# Patient Record
Sex: Female | Born: 1950
Health system: Southern US, Community
[De-identification: ages and names within clinical notes are randomized; demographics above are authoritative.]

## PROBLEM LIST (undated history)

## (undated) DIAGNOSIS — E785 Hyperlipidemia, unspecified: Secondary | ICD-10-CM

## (undated) DIAGNOSIS — M1990 Unspecified osteoarthritis, unspecified site: Secondary | ICD-10-CM

## (undated) DIAGNOSIS — G459 Transient cerebral ischemic attack, unspecified: Secondary | ICD-10-CM

## (undated) DIAGNOSIS — K2970 Gastritis, unspecified, without bleeding: Secondary | ICD-10-CM

## (undated) DIAGNOSIS — I10 Essential (primary) hypertension: Secondary | ICD-10-CM

## (undated) DIAGNOSIS — K219 Gastro-esophageal reflux disease without esophagitis: Secondary | ICD-10-CM

## (undated) HISTORY — PX: JOINT REPLACEMENT: SHX530

## (undated) HISTORY — PX: BREAST REDUCTION SURGERY: SHX8

## (undated) HISTORY — DX: Essential (primary) hypertension: I10

## (undated) HISTORY — DX: Hyperlipidemia, unspecified: E78.5

## (undated) HISTORY — PX: REPLACEMENT TOTAL KNEE: SUR1224

---

## 2002-04-25 ENCOUNTER — Ambulatory Visit (HOSPITAL_COMMUNITY): Admission: RE | Admit: 2002-04-25 | Discharge: 2002-04-25 | Payer: Self-pay | Admitting: Specialist

## 2002-04-25 ENCOUNTER — Encounter: Payer: Self-pay | Admitting: Specialist

## 2002-10-06 DIAGNOSIS — G459 Transient cerebral ischemic attack, unspecified: Secondary | ICD-10-CM

## 2002-10-06 HISTORY — DX: Transient cerebral ischemic attack, unspecified: G45.9

## 2006-04-13 ENCOUNTER — Ambulatory Visit (HOSPITAL_COMMUNITY): Admission: RE | Admit: 2006-04-13 | Discharge: 2006-04-13 | Payer: Self-pay | Admitting: Gastroenterology

## 2006-04-13 ENCOUNTER — Ambulatory Visit: Payer: Self-pay | Admitting: Gastroenterology

## 2006-04-13 HISTORY — PX: COLONOSCOPY: SHX174

## 2007-06-14 ENCOUNTER — Encounter (INDEPENDENT_AMBULATORY_CARE_PROVIDER_SITE_OTHER): Payer: Self-pay | Admitting: Specialist

## 2007-06-14 ENCOUNTER — Ambulatory Visit (HOSPITAL_BASED_OUTPATIENT_CLINIC_OR_DEPARTMENT_OTHER): Admission: RE | Admit: 2007-06-14 | Discharge: 2007-06-15 | Payer: Self-pay | Admitting: Specialist

## 2007-07-22 ENCOUNTER — Ambulatory Visit (HOSPITAL_COMMUNITY): Admission: RE | Admit: 2007-07-22 | Discharge: 2007-07-22 | Payer: Self-pay | Admitting: Internal Medicine

## 2009-10-06 HISTORY — PX: ESOPHAGOGASTRODUODENOSCOPY: SHX1529

## 2010-05-17 DIAGNOSIS — Z8679 Personal history of other diseases of the circulatory system: Secondary | ICD-10-CM | POA: Insufficient documentation

## 2010-05-21 ENCOUNTER — Ambulatory Visit: Payer: Self-pay | Admitting: Gastroenterology

## 2010-05-21 DIAGNOSIS — R1319 Other dysphagia: Secondary | ICD-10-CM | POA: Insufficient documentation

## 2010-05-24 ENCOUNTER — Encounter: Payer: Self-pay | Admitting: Gastroenterology

## 2010-05-31 ENCOUNTER — Ambulatory Visit (HOSPITAL_COMMUNITY): Admission: RE | Admit: 2010-05-31 | Discharge: 2010-05-31 | Payer: Self-pay | Admitting: Gastroenterology

## 2010-05-31 ENCOUNTER — Ambulatory Visit: Payer: Self-pay | Admitting: Gastroenterology

## 2010-06-07 ENCOUNTER — Encounter: Payer: Self-pay | Admitting: Gastroenterology

## 2010-09-20 ENCOUNTER — Encounter (INDEPENDENT_AMBULATORY_CARE_PROVIDER_SITE_OTHER): Payer: Self-pay | Admitting: *Deleted

## 2010-11-05 NOTE — Letter (Signed)
Summary: Internal Other Kaylyn Lim statement for [employer]  Internal Other Kaylyn Lim statement for [employer]   Imported By: Cloria Spring LPN 29/52/8413 24:40:10  _____________________________________________________________________  External Attachment:    Type:   Image     Comment:   External Document

## 2010-11-05 NOTE — Letter (Signed)
Summary: Internal Other /EGD ORDER  Internal Other /EGD ORDER   Imported By: Cloria Spring LPN 29/51/8841 66:06:30  _____________________________________________________________________  External Attachment:    Type:   Image     Comment:   External Document

## 2010-11-05 NOTE — Assessment & Plan Note (Signed)
Summary: DYSPHAGIA   Visit Type:  Initial Consult Referring Provider:  Ouida Sills Primary Care Provider:  Ouida Sills, M.D.  Chief Complaint:  dysphagia.  History of Present Illness: Solid dysphagia since the spring: chicken and dry fish. Keeps indigestion: burning in the epigastrium, ceratin foods or drinks trigger it. No meds for indigestion and didn't help. Taking Pepcid AC. No nausea or vomiting. Weight loss: lost 30 lbs since Christmas. TCS: [facility]. No EGD. No problems with sedation. No diarrhea, constipation, melena, or BRBPR. No abd pain. 1 can of caffeinated beverage a day. No OJ.   Preventive Screening-Counseling & Management  Alcohol-Tobacco     Smoking Status: never  Current Medications (verified): 1)  Zocor 40 Mg Tabs (Simvastatin) 2)  Altace 2.5 Mg Caps (Ramipril) .... Take 1 Tablet By Mouth Once A Day 3)  Hydrochlorothiazide 25 Mg Tabs (Hydrochlorothiazide) .... Take 1 Tablet By Mouth Once A Day 4)  Aspirin 325 Mg Tabs (Aspirin) .... Take 1 Tablet By Mouth Once A Day 5)  Lamictal .... Twice Daily 6)  Oxybutynin Chloride 5 Mg Tabs (Oxybutynin Chloride) .... Take 1 Tablet By Mouth Once A Day 7)  Multi Vitamin .... Take 1 Tablet By Mouth Once A Day 8)  Osteo Biflex .... Take 1 Tablet By Mouth Two Times A Day 9)  Calcium 600 Mg .... Take 1 Tablet By Mouth Once A Day 10)  Vitamin D .... Two Tablets Daily 11)  Vitamin B12 .... One Daily 12)  Silymarin  Caps (Milk Thistle-Turmeric) .... Two Tablets Daily 13)  Mobic 15 Mg Tabs (Meloxicam) .... Take 1 Tablet By Mouth Once A Day  Allergies (verified): No Known Drug Allergies  Past History:  Past Medical History: Hyperlipidemia Hypertension "Dropped bladder" Arthritis Stroke: Lacunar 2003 Compulsive Personality TCS 2007 SLM: NL   Past Surgical History: Knee X3  Family History: No FH of Colon Cancer or polyps  Social History: Patient has never smoked.  Alcohol Use - no Married: 2 kids, youngest 56. Occupation: social  services Smoking Status:  never  Review of Systems       MAY 2011: CR 0.7 ALB 4.0 T BILI 0.4 ALK PHOS 81 AST 14 ALT 17  Per HPI otherwise all systems negative.  Vital Signs:  Patient profile:   60 year old female Height:      69 inches Weight:      254 pounds BMI:     37.64 Temp:     97.6 degrees F oral Pulse rate:   64 / minute BP sitting:   110 / 80  (left arm) Cuff size:   large  Vitals Entered By: Cloria Spring LPN (May 21, 2010 10:15 AM)  Physical Exam  General:  Well developed, well nourished, no acute distress. Head:  Normocephalic and atraumatic. Eyes:  PERRLA, no icterus. Mouth:  No deformity or lesions, dentition normal. Neck:  Supple; no masses. Lungs:  Clear throughout to auscultation. Heart:  Regular rate and rhythm; no murmurs. Abdomen:  Soft, nontender and nondistended. No masses,  or hernias noted. Normal bowel sounds. obese.   Extremities:  No edema or deformities noted. Neurologic:  Alert and  oriented x4;  grossly normal neurologically.  Impression & Recommendations:  Problem # 1:  OTHER DYSPHAGIA (ICD-787.29) Most likley 2o to peptic stricture. Take omeprazole two times a day 30 minutes before meals. Follow GERD recommendation for lifestyle changes. EGD/dilation 8/26. May use Pepcid AC as needed. Follow up in 4 months.  CC: PCP  Patient Instructions: 1)  Take  omeprazole two times a day 30 minutes before meals. 2)  Follow GERD recommendation for lifestyle changes. 3)  EGD/dilation 8/26. 4)  Follow up in 4 months. 5)  The medication list was reviewed and reconciled.  All changed / newly prescribed medications were explained.  A complete medication list was provided to the patient / caregiver. Prescriptions: PRILOSEC 20 MG CPDR (OMEPRAZOLE) 1 by mouth 30 minutes prior to breakfast and supper  #60 x 5   Entered and Authorized by:   West Bali MD   Signed by:   West Bali MD on 05/21/2010   Method used:   Electronically to        [facility]   [address] (retail)       [address]       [place], [place]  [address]       Ph: [phone]       Fax: [phone]   RxID:   [id number]   Appended Document: DYSPHAGIA REMINDER OF OV IN 4 MONTHS IS IN THE COMPUTER/LAW  Appended Document: DYSPHAGIA 4 MONTH OV F/U IS IN THE COMPUTER/LAW  Appended Document: Orders Update    Clinical Lists Changes  Orders: Added new Service order of Consultation Level III [phone]) - Signed

## 2010-11-07 NOTE — Letter (Signed)
Summary: Recall Office Visit  [facility]  [address]   [place], [place] [address]   Phone: [phone]  Fax: [phone]      September 20, 2010   MICHAELIA BEILFUSS [address] [place], [place]  [address] [birth year]   Dear Ms. Twiford,   According to our records, it is time for you to schedule a follow-up office visit with [place].   At your convenience, please call [phone] to schedule an office visit. If you have any questions, concerns, or feel that this letter is in error, we would appreciate your call.   Sincerely,    Rexene Alberts  [facility] Ph: [phone]   Fax: [phone]

## 2010-12-20 LAB — KOH PREP: KOH Prep: NONE SEEN

## 2011-02-18 NOTE — Op Note (Signed)
NAMEGWENDLYN, [name]                ACCOUNT NO.:  [id number]   MEDICAL RECORD NO.:  [id number]          PATIENT TYPE:  AMB   LOCATION:  DSC                          FACILITY:  [facility]   PHYSICIAN:  Earvin Hansen L. Shon Hough, M.D.DATE OF BIRTH:  15-Jan-1951   DATE OF PROCEDURE:  06/14/2007  DATE OF DISCHARGE:                               OPERATIVE REPORT   INDICATIONS:  A 60 year old lady with severe, severe macromastia, right  side greater than the left.  Each breast is E-F size in a bra.  She has  had a history of increased back pain, neck pain, intertriginous changes  under the breast.  She has failed conservative treatment with special  bras etc.   PROCEDURE:  Planned bilateral breast reductions using the inferior  pedicle technique.   ANESTHESIA:  General.   The patient underwent general anesthesia, intubated orally, after she  had undergone drawings for marks for the inferior pedicle reduction  mammoplasty, re-marking the nipple-areolar complex 23 cm from the  suprasternal notch.  She then underwent general anesthesia.  Prep was  done to the chest wall, abdomen, and neck with Hibiclens soap and  solution and walled off with sterile towels and drapes so as to make a  sterile field.  The drawings were then injected with 0.25% Xylocaine  with epinephrine 1:400,000 concentration, a total of 200 cc per side  1:400,000 concentration.  Next, the wounds were scored with a #15 blade.  The skin of the inferior pedicle was de-epithelialized with #20 blade.  Medial and lateral fatty dermal pedicles were incised down to the  underlying fascia.  Hemostasis was again maintained with the Bovie  coagulation.  New keyhole area was also debulked, and also lateral  tissue was removed with accessory tissue in the bulb aspects all the way  up to the axillary regions in the right and left latissimus dorsi areas.  After proper hemostasis, the flaps were transposed and stayed with 3-0  Prolene.  Subcutaneous  closure was done with 3-0 Monocryl x2  layers,  and then a running subcuticular stitch of 3-0 Monocryl and 5-0 Monocryl  throughout the inverted T.  The same was carried out on both sides.  At  the end of the procedure, the nipple-areolar complexes were examined,  showing good suppleness and good blood return.  The wounds were  cleansed.  Half-inch Steri-Strips and soft dressing were applied to all  the areas.  She withstood the procedure very well and was taken to  recovery in good condition.  Each breast was drained with a #10 Blake  drain.      Yaakov Guthrie. Shon Hough, M.D.  Electronically Signed     GLT/MEDQ  D:  06/14/2007  T:  06/15/2007  Job:  [id number]

## 2011-02-21 NOTE — Op Note (Signed)
NAMEPETREA, [name]                ACCOUNT NO.:  [id number]   MEDICAL RECORD NO.:  [id number]          PATIENT TYPE:  AMB   LOCATION:  DAY                           FACILITY:  [facility]   PHYSICIAN:  Kassie Mends, M.D.      DATE OF BIRTH:  1951/10/02   DATE OF PROCEDURE:  04/13/2006  DATE OF DISCHARGE:                                 OPERATIVE REPORT   PROCEDURE:  Colonoscopy.   MEDICATIONS:  1.  Demerol 100 mg IV.  2.  Versed 7 mg IV.   INDICATIONS FOR PROCEDURE:  [name] is a 60 year old female who presents  for average-risk colon cancer screening.   FINDINGS:  1.  Normal colon without evidence of polyps, masses, inflammatory changes,      diverticular or vascular ectasia.  2.  Normal retroflex view of the rectum.   RECOMMENDATIONS:  1.  Repeat screening colonoscopy in 10 years.  2.  Follow up with Dr. Carylon Perches.   DESCRIPTION OF PROCEDURE:  Physical exam was performed, and informed consent  was obtained from the patient after explaining all of the risks  (perforation, bleeding, infection, and adverse effects to the medicines),  benefits, and alternatives to the procedure which the patient appeared to  understand and so stated.  The patient was connected to a monitoring device  and placed in the left lateral position.  Continuous oxygen was provided by  nasal cannula, and IV medications were administered through an indwelling  cannula.  After administration of sedation and rectal exam, the patient's rectum was  intubated and the scope was advanced  under direct visualization to the cecum.  The scope was subsequently removed  slowly by carefully examining the colon texture and anatomy and integrity of  the mucosa on the way out.  The patient was recovered in endoscopy suite and  discharged to postop in satisfactory condition.      Kassie Mends, M.D.  Electronically Signed     SM/MEDQ  D:  04/13/2006  T:  04/13/2006  Job:  [id number]   cc:   Kingsley Callander. Ouida Sills, MD  Fax:  [phone]

## 2011-02-21 NOTE — Op Note (Signed)
[facility]  Patient:    [name], [name] Visit Number: [id number] MRN: [id number]          Service Type: DSU Location: DAY Attending Physician:  Rocky Crafts Dictated by:   Michael Litter. Montez Morita, M.D. Proc. Date: 04/25/02 Admit Date:  04/25/2002 Discharge Date: 04/25/2002                             Operative Report  PREOPERATIVE DIAGNOSES: 1. Torn medial meniscus with chondromalacia of the patellofemoral joint. 2. Chondromalacia of the tibiofemoral joint.  POSTOPERATIVE DIAGNOSES: 1. Torn medial meniscus with chondromalacia of the patellofemoral joint. 2. Chondromalacia of the tibiofemoral joint. 3. Minimal tear of the medial meniscus. 4. Significant arthritic change in medial femoral condyle. 5. Small flap tear off the lateral femoral condyle. 6. Significant patellar chondromalacia.  OPERATION: 1. Partial medial meniscectomy. 2. Patella shave. 3. Removal of small flap tear off the lateral meniscus. 4. Minimal chondroplasty medial femoral condyle.  SURGEON:  Philips J. Montez Morita, M.D.  DESCRIPTION OF PROCEDURE:  After general anesthesia, the knee was prepped and draped routinely for arthroscopy, and 20 cc 0.5% Marcaine with adrenaline were injected into the knee just after the prep and before the draping.  Lateral parapatellar inflow portal allows escape of fluid after it had been in for 5 to 10 minutes.  Then an anterolateral portal for the scope and an anteromedial portal made under direct visualization for instrumentation.  In the patellofemoral joint, significant changes in the patella, not so bad in the trochlear notch of the femur.  At the end of the procedure, we went back up and did an abrasion arthroplasty on the underside of the patella.  Medial femoral condyle was striking with the areas of bare bone and loose cartilage all up and down the whole medial femoral condyle, and this was smoothed and debrided.  The medial meniscus  did not have the extensive flap tear described in the MRI report, but there was some degenerative tearing, and it was trimmed back to good firm meniscus, particularly in the posterior aspect.  The lateral meniscus appeared quite normal.  There was one area along the articular surface in the lateral femoral condyle with loose flap of articular cartilage, and this piece was debrided and trimmed out.  At the end of the procedure, 15 cc of Marcaine was put in; 1% Xylocaine was used in each of the three portals.  Ice compression dressing.  She goes to recovery in good condition.  She was to receive 30 Toradol IV at the end of the case. Dictated by:   [employer]. Montez Morita, M.D. Attending Physician:  Rocky Crafts DD:  04/25/02 TD:  04/28/02 Job: [id number]

## 2011-07-18 LAB — BASIC METABOLIC PANEL
BUN: 20
CO2: 29
Calcium: 9.5
Chloride: 100
Creatinine, Ser: 0.66
GFR calc Af Amer: >60
GFR calc non Af Amer: >60
Glucose, Bld: 97
Potassium: 4.1
Sodium: 135

## 2011-07-18 LAB — POCT HEMOGLOBIN-HEMACUE
Hemoglobin: 14.5
Operator id: 112821

## 2011-07-23 ENCOUNTER — Other Ambulatory Visit: Payer: Self-pay | Admitting: Orthopedic Surgery

## 2011-07-23 ENCOUNTER — Ambulatory Visit (HOSPITAL_COMMUNITY)
Admission: RE | Admit: 2011-07-23 | Discharge: 2011-07-23 | Disposition: A | Discharge status: Home / Self Care | Payer: BC Managed Care – PPO | Source: Ambulatory Visit | Attending: Orthopedic Surgery | Admitting: Orthopedic Surgery

## 2011-07-23 ENCOUNTER — Other Ambulatory Visit (HOSPITAL_COMMUNITY): Payer: Self-pay | Admitting: Orthopedic Surgery

## 2011-07-23 ENCOUNTER — Ambulatory Visit (HOSPITAL_COMMUNITY): Payer: BC Managed Care – PPO

## 2011-07-23 ENCOUNTER — Encounter (HOSPITAL_COMMUNITY): Payer: BC Managed Care – PPO

## 2011-07-23 DIAGNOSIS — Z01818 Encounter for other preprocedural examination: Secondary | ICD-10-CM

## 2011-07-23 DIAGNOSIS — Z01810 Encounter for preprocedural cardiovascular examination: Secondary | ICD-10-CM | POA: Insufficient documentation

## 2011-07-23 DIAGNOSIS — Z01812 Encounter for preprocedural laboratory examination: Secondary | ICD-10-CM | POA: Insufficient documentation

## 2011-07-23 DIAGNOSIS — M1710 Unilateral primary osteoarthritis, unspecified knee: Secondary | ICD-10-CM | POA: Insufficient documentation

## 2011-07-23 DIAGNOSIS — M47814 Spondylosis without myelopathy or radiculopathy, thoracic region: Secondary | ICD-10-CM | POA: Insufficient documentation

## 2011-07-23 LAB — DIFFERENTIAL
Basophils Absolute: 0 10*3/uL (ref 0.0–0.1)
Basophils Relative: 1 % (ref 0–1)
Eosinophils Absolute: 0.1 10*3/uL (ref 0.0–0.7)
Eosinophils Relative: 2 % (ref 0–5)
Lymphocytes Relative: 36 % (ref 12–46)
Lymphs Abs: 2.6 10*3/uL (ref 0.7–4.0)
Monocytes Absolute: 0.6 10*3/uL (ref 0.1–1.0)
Monocytes Relative: 9 % (ref 3–12)
Neutro Abs: 3.9 10*3/uL (ref 1.7–7.7)
Neutrophils Relative %: 54 % (ref 43–77)

## 2011-07-23 LAB — URINALYSIS, ROUTINE W REFLEX MICROSCOPIC
Bilirubin Urine: NEGATIVE
Glucose, UA: NEGATIVE mg/dL
Hgb urine dipstick: NEGATIVE
Ketones, ur: NEGATIVE mg/dL
Nitrite: NEGATIVE
Protein, ur: NEGATIVE mg/dL
Specific Gravity, Urine: 1.016 (ref 1.005–1.030)
Urobilinogen, UA: 0.2 mg/dL (ref 0.0–1.0)
pH: 6.5 (ref 5.0–8.0)

## 2011-07-23 LAB — BASIC METABOLIC PANEL
BUN: 12 mg/dL (ref 6–23)
CO2: 29 mEq/L (ref 19–32)
Calcium: 9.5 mg/dL (ref 8.4–10.5)
Chloride: 104 mEq/L (ref 96–112)
Creatinine, Ser: 0.65 mg/dL (ref 0.50–1.10)
GFR calc Af Amer: >90 mL/min (ref >90)
GFR calc non Af Amer: >90 mL/min (ref >90)
Glucose, Bld: 83 mg/dL (ref 70–99)
Potassium: 3.6 mEq/L (ref 3.5–5.1)
Sodium: 139 mEq/L (ref 135–145)

## 2011-07-23 LAB — APTT: aPTT: 35 seconds (ref 24–37)

## 2011-07-23 LAB — SURGICAL PCR SCREEN
MRSA, PCR: NEGATIVE
Staphylococcus aureus: NEGATIVE

## 2011-07-23 LAB — URINE MICROSCOPIC-ADD ON

## 2011-07-23 LAB — CBC
HCT: 42.6 % (ref 36.0–46.0)
Hemoglobin: 14.1 g/dL (ref 12.0–15.0)
MCH: 29.5 pg (ref 26.0–34.0)
MCHC: 33.1 g/dL (ref 30.0–36.0)
MCV: 89.1 fL (ref 78.0–100.0)
Platelets: 280 10*3/uL (ref 150–400)
RBC: 4.78 MIL/uL (ref 3.87–5.11)
RDW: 13.2 % (ref 11.5–15.5)
WBC: 7.3 10*3/uL (ref 4.0–10.5)

## 2011-07-23 LAB — PROTIME-INR
INR: 0.96 (ref 0.00–1.49)
Prothrombin Time: 13 seconds (ref 11.6–15.2)

## 2011-07-28 ENCOUNTER — Inpatient Hospital Stay (HOSPITAL_COMMUNITY)
Admission: RE | Admit: 2011-07-28 | Discharge: 2011-08-04 | DRG: 471 | Disposition: A | Discharge status: Rehabilitation Facility | Payer: BC Managed Care – PPO | Source: Ambulatory Visit | Attending: Orthopedic Surgery | Admitting: Orthopedic Surgery

## 2011-07-28 DIAGNOSIS — I10 Essential (primary) hypertension: Secondary | ICD-10-CM | POA: Diagnosis present

## 2011-07-28 DIAGNOSIS — Z7982 Long term (current) use of aspirin: Secondary | ICD-10-CM

## 2011-07-28 DIAGNOSIS — Z01810 Encounter for preprocedural cardiovascular examination: Secondary | ICD-10-CM

## 2011-07-28 DIAGNOSIS — E785 Hyperlipidemia, unspecified: Secondary | ICD-10-CM | POA: Diagnosis present

## 2011-07-28 DIAGNOSIS — Z01818 Encounter for other preprocedural examination: Secondary | ICD-10-CM

## 2011-07-28 DIAGNOSIS — Z01812 Encounter for preprocedural laboratory examination: Secondary | ICD-10-CM

## 2011-07-28 DIAGNOSIS — M1710 Unilateral primary osteoarthritis, unspecified knee: Principal | ICD-10-CM | POA: Diagnosis present

## 2011-07-28 DIAGNOSIS — K219 Gastro-esophageal reflux disease without esophagitis: Secondary | ICD-10-CM | POA: Diagnosis present

## 2011-07-28 DIAGNOSIS — Z8673 Personal history of transient ischemic attack (TIA), and cerebral infarction without residual deficits: Secondary | ICD-10-CM

## 2011-07-28 LAB — TYPE AND SCREEN
ABO/RH(D): O POS
Antibody Screen: NEGATIVE

## 2011-07-28 LAB — ABO/RH: ABO/RH(D): O POS

## 2011-07-28 NOTE — H&P (Addendum)
[name], [name] NO.:  [id number]  MEDICAL RECORD NO.:  [id number]  LOCATION:                               FACILITY:  [facility]  PHYSICIAN:  Madlyn Frankel. Charlann Boxer, M.D.  DATE OF BIRTH:  1951/01/14  DATE OF ADMISSION:  07/28/2011 DATE OF DISCHARGE:                             HISTORY & PHYSICAL   DATE OF SURGERY:  July 28, 2011  ADMITTING DIAGNOSIS:  Osteoarthritis, bilateral knees.  HISTORY OF PRESENT ILLNESS:  This is a 60 year old lady with a history of osteoarthritis of bilateral knees with failure of conserve treatment to alleviate her symptoms.  After discussion of treatment, benefits, risks, and options, the patient now scheduled for bilateral total knee arthroplasty.  She has had a history of a TIA in the past and is on aspirin daily, she is advised to stop that at this time and call Dr. Ouida Sills and make sure it is okay that she stops it.  If not, she will need to contact us and we will need to make other arrangements regarding her surgery, but as long as she can stop that, she will.  Otherwise, she is given her postoperative medications of aspirin, iron, MiraLax, Colace, and Robaxin to take postoperatively.  She will be going home after her surgery.  Dr. Ouida Sills is her medical doctor.  PAST MEDICAL HISTORY:  Drug allergies:  None.  Medical illnesses include; 1. Hypertension. 2. Reflux. 3. Hyperlipidemia. 4. History of transient ischemic attack.  CURRENT MEDICATIONS: 1. Hydrochlorothiazide 25 mg daily. 2. Ramipril 2.5 mg daily. 3. Oxybutynin 5 mg b.i.d. 4. Omeprazole 20 mg daily. 5. Simvastatin 40 mg daily. 6. Aspirin 325 mg daily. 7. Potassium p.r.n.  PREVIOUS SURGERIES:  Include breast reduction and bilateral knee arthroscopic surgery.  FAMILY HISTORY:  Positive for hypertension, diabetes, congestive heart failure.  SOCIAL HISTORY:  The patient is married.  She lives at home.  She works. She does not smoke and does not drink.  REVIEW OF  SYSTEMS:  CENTRAL NERVOUS SYSTEM:  Negative for headache, blurred vision, or dizziness.  Positive for history of TIA.  PULMONARY: Negative for shortness breath, PND, or orthopnea.  CARDIOVASCULAR: Negative for chest pain or palpitation.  GI:  Negative for ulcers, hepatitis.  Positive for reflux.  GU:  Negative for urinary tract difficulty.  MUSCULOSKELETAL:  Positive in HPI.  PHYSICAL EXAMINATION:  VITAL SIGNS:  BP 124/82, respirations 16, pulse 72 and regular. GENERAL APPEARANCE:  This is a well-developed, well-nourished lady in no acute distress. HEENT:  Head normocephalic.  Nose patent.  Ears patent.  Pupils are equal, round, reactive to light.  Throat without injection. NECK:  Supple without adenopathy.  Carotids are 2+, without bruit. CHEST:  Clear to auscultation.  No rales or rhonchi.  Respirations 16. Pulse 72 beats per minute, regular, without murmur. ABDOMEN:  Soft with active bowel sounds.  No masses or organomegaly. NEUROLOGIC:  The patient is alert and oriented to time, place, and person.  Cranial nerves II-XII grossly intact. EXTREMITIES:  Show a valgus deformity of both knees with decreased range of motion and painful range of motion.  Neurovascular status intact.  IMPRESSION:  Osteoarthritis, bilateral knees.  PLAN:  Total knee arthroplasty, bilateral knees.     Jaquelyn Bitter. Chabon, P.A.   ______________________________ Madlyn Frankel Charlann Boxer, M.D.    SJC/MEDQ  D:  07/23/2011  T:  07/23/2011  Job:  [id number]  Electronically Signed by Jodene Nam P.A. on 07/24/2011 04:56:48 PM Electronically Signed by Durene Romans M.D. on 07/28/2011 12:38:41 PM

## 2011-07-29 LAB — CBC
HCT: 28.2 % — ABNORMAL LOW (ref 36.0–46.0)
Hemoglobin: 9.5 g/dL — ABNORMAL LOW (ref 12.0–15.0)
MCH: 29.9 pg (ref 26.0–34.0)
MCHC: 33.7 g/dL (ref 30.0–36.0)
MCV: 88.7 fL (ref 78.0–100.0)
Platelets: 171 10*3/uL (ref 150–400)
RBC: 3.18 MIL/uL — ABNORMAL LOW (ref 3.87–5.11)
RDW: 13.1 % (ref 11.5–15.5)
WBC: 6.9 10*3/uL (ref 4.0–10.5)

## 2011-07-29 LAB — BASIC METABOLIC PANEL
BUN: 11 mg/dL (ref 6–23)
CO2: 24 mEq/L (ref 19–32)
Calcium: 7.8 mg/dL — ABNORMAL LOW (ref 8.4–10.5)
Chloride: 103 mEq/L (ref 96–112)
Creatinine, Ser: 0.54 mg/dL (ref 0.50–1.10)
GFR calc Af Amer: >90 mL/min (ref >90)
GFR calc non Af Amer: >90 mL/min (ref >90)
Glucose, Bld: 126 mg/dL — ABNORMAL HIGH (ref 70–99)
Potassium: 3.1 mEq/L — ABNORMAL LOW (ref 3.5–5.1)
Sodium: 135 mEq/L (ref 135–145)

## 2011-07-29 NOTE — Op Note (Signed)
NAMEJAYONA, MCCAIG NO.:  [id number]  MEDICAL RECORD NO.:  [id number]  LOCATION:  0008                         FACILITY:  [facility]  PHYSICIAN:  Madlyn Frankel. Charlann Boxer, M.D.  DATE OF BIRTH:  [birth year]  DATE OF PROCEDURE:  07/28/2011 DATE OF DISCHARGE:                              OPERATIVE REPORT   PREOPERATIVE DIAGNOSIS:  Bilateral knee osteoarthritis.  POSTOPERATIVE DIAGNOSIS:  Bilateral knee osteoarthritis.  PROCEDURE:  Bilateral total knee replacements.  COMPONENTS USED:  DePuy rotating platform posterior stabilized knee system with a size of the left knee, size 4 narrow femur, 3 tibia, 10-mm insert, and a 41 patellar button.  The right knee with a 4 narrow femur, 3 tibia, 10-mm insert, and a 41 patellar button.  SURGEON:  Madlyn Frankel. Charlann Boxer, MD  ASSISTANT:  [name], PA  ANESTHESIA:  Epidural catheter.  SPECIMENS:  None.  DRAINS:  One Hemovac drain per knee.  TOURNIQUET TIME:  On the left knee, was 37 minutes at 250 mmHg; right knee was 39 minutes at 250 mmHg.  COMPLICATION:  None.  INDICATION FOR PROCEDURE:  Ms. Desantiago is a 60 year old female who presented to the office for evaluation of bilateral knee pain. Radiographs revealed that she had end-stage bone-on-bone arthritis on the right knee medially, left knee laterally with tricompartmental osteophyte formation, subchondral cystic formation, and gentle subluxation.  She had failed conservative measures of injections, medications, activity modification.  She had a significant reduction of quality of life and at this point, is ready to proceed with more definitive measures.  Risks of infection, DVT, component failure, need for revision surgery were all discussed in this setting of a staged bilateral total knee replacement for simultaneous knee replacement.  At this point, she wished to proceed with simultaneous bilateral total knee replacements.  Consent was obtained for benefit of pain  relief.  PROCEDURE IN DETAIL:  The patient was brought to operative theater. Once adequate anesthesia, preoperative antibiotics, Ancef administered, she was positioned supine with bilateral thigh tourniquets placed.  Bilateral lower extremities were then prepped and draped in the sterile fashion with both feet placed into the Mayo leg holders.  Time-out was performed identifying the patient, the planned procedures, and the extremities.  The plan was to begin with left knee and procedure with the right knee to follow.  Following the time-out, the left leg was exsanguinated.  Tourniquet elevated to 250 mmHg.  A midline incision was made followed by median parapatellar arthrotomy.  This was the valgus-oriented knee, so minimal proximal peel was carried out following initial exposure and synovectomy.  Attention was first directed to the patella.  She was noted to have significant patellofemoral arthritis with significant osteophytosis as well as bone-on-bone changes.  Precut measurement was approximately 23 mm.  I resected down to about 14 mm and chose to use a 41 patellar button to restore height, but also to protect the patella to restore height as well as to cover the cut surface.  The lug holes were drilled and a metal shim was placed to protect the patella from retractors and saw blades.  At this point, the attention was directed to the femur.  Femoral canal  was opened with a drill, irrigated to try to prevent fat emboli.  An intramedullary rod was passed and at 5 degrees valgus, I resected 10 mm of bone off the distal femur.  Following this resection, the tibia was subluxated anteriorly. Using extramedullary guide, based on the valgus and lateral degenerative changes, I chose to resect 4 mm of bone off the proximal lateral tibia. Following this resection, we confirmed the gap to be stable with a 10-mm insert.  I had already released some of the soft tissues off the proximal tibia  and did a little bit more of this as retractor placement facilitated this after removing this out of the proximal tibial cut.  Both the medial and lateral ligaments appeared to be stable with a 10-mm insert in place.  I also confirmed the cut was perpendicular in the coronal plane.  Once this was done, I sized the femur to be a size 4 in an anteroposterior dimension and had the locally sized 3 medially and laterally, thus we chose the 4 narrow component.  At this point, the 4-in-1 cutting block was pinned into position, anterior reference using the C-clamp to set rotation based upon the proximal tibial cut.  The anterior, posterior, and chamfer cuts were made without difficulty nor notching.  The final box cut was made off the lateral aspect of the distal femur. The tibia was then subluxated anteriorly and based on the orientation over her tibia, the tibial tray was pinned to the medial 3rd of the tubercle.  The size 3 tibial tray was pinned in position, drilled and keel punched.  Trial reduction was carried out with a 4 narrow femur, 3 tibia, and a 10-mm insert.  With this, the knee came to full extension, the 41 patellar button tracked nicely through the trochlea without application of pressure.  No lateral release required.  Given these findings, the trial components removed.  Final components were opened.  The synovial capsule junction was injected with 30 cc of 0.25% Marcaine with epinephrine and 1 cc of Toradol.  The knee was irrigated with normal saline solution and pulse lavaged. The final components were then cemented onto clean and dried cut surface of the bone, with the knee brought to extension with a 10-mm insert. Following irrigation, the final 10-mm insert was chosen, placed the knee.  A medium Hemovac drain was placed deep.  The knee was re-irrigated with saline solution.  The extensor mechanism was reapproximated using #1 Vicryl with the knee in flexion.  The  remaining wound was closed with 2- 0 Vicryl and running 4-0 Monocryl.  Please note the final wound cleansing and dressing with Dermabond and Aquacel dressing was done at the end of the case when the right knee has been done.  The left knee was provisionally wrapped with an Ace wrap during the right knee procedure.  Following this left knee procedure, the right knee was identified.  The right lower extremity was exsanguinated and tourniquet elevated to 250 mmHg.  This was a varus knee.  A midline incision was made followed by median parapatellar arthrotomy and a proximal medial peel for exposure and release of the medial aspect of the knee.  Attention was first directed to the patella.  Precut measurement again was 23 mm.  I resected down to 14 mm and again used a 41 patellar button.  Lug holes were drilled and a metal shim placed to protect the patella from retractors and saw blades.  The attention was now directed  to the femur.  Femoral canal was opened with a drill and intramedullary rod was passed and at 5 degrees of valgus, 10 mm of bone resected off the distal femur as well.  At this point, the attention was directed to tibia.  Tibia was subluxated anteriorly using extramedullary guide.  Based on the varus nature of this knee, I chose to resect 8 mm of bone off the proximal lateral tibia.  Following this, I confirmed the gap would be stable with a 10-mm insert, but it felt like this will be more stable with a 12.5 insert.  Both medial and lateral collateral ligaments remained intact and stable.  I also confirmed the cut was perpendicular in the coronal plane.  I sized the femur to be a size 4 in an anterior-posterior dimension.  The size 4 rotation block was then pinned into position, anterior reference using the C-clamp as the contralateral knee was.  The 4-in-1 cutting block was then pinned into position.  Anterior, posterior, and chamfer cuts were made without  difficulty, no notching.  Final box cut was made off the lateral aspect of the femur.  The tibia subluxated anteriorly and again the cut surface's best fit with a size 3 tibial tray pinned through the medial 3rd of the tubercle. The tray was then drilled and keel punched.  Trial reduction was carried out with a 4 narrow femur, 3 tibia, and at this point, a 12.5 insert. With this, the knee came to full extension, was stable from extension to flexion.  The patella tracked through the trochlea without application of pressure.  Given all these findings, the trial components removed. Another 30 cc of 0.25% Marcaine with epinephrine and 1 cc of Toradol were injected in the synovial capsule junction.  The knee was irrigated with normal saline solution.  Final components were opened and cement mixed.  Final components were then cemented into position.  The knee was brought to extension with a 12.5 insert.  Extruded cement was removed once the cement had fully cured and excess cement was removed throughout the knee.  The final 12.5 insert was chosen and placed into the knee.  We re- irrigated the knee with normal saline solution.  The medium Hemovac drain was placed deep.  The extensor mechanism was reapproximated using #1 Vicryl with the knee in flexion.  The remaining wound was closed with 2-0 Vicryl, running 4-0 Monocryl.  At this point, both wounds were cleaned, dried, and dressed sterilely using Dermabond and Aquacel dressing, Ace wrap was loosely applied. Drain sites were dressed separately.  She was then brought to the recovery room in stable condition, tolerating the procedure well.     Madlyn Frankel Charlann Boxer, M.D.     MDO/MEDQ  D:  07/28/2011  T:  07/28/2011  Job:  [id number]  Electronically Signed by Durene Romans M.D. on 07/29/2011 11:01:08 AM

## 2011-07-29 NOTE — Op Note (Signed)
  NAMETERRYE, DOMBROSKY NO.:  [id number]  MEDICAL RECORD NO.:  [id number]  LOCATION:  1610                         FACILITY:  [facility]  PHYSICIAN:  Madlyn Frankel. Charlann Boxer, M.D.  DATE OF BIRTH:  23-May-1951  DATE OF PROCEDURE: DATE OF DISCHARGE:                              OPERATIVE REPORT   ADDENDUM:  Please note the physician assistant [name], was present for the entirety of this bilateral procedure important for preoperative positioning, perioperative fracture management, general facilitation of this bilateral case in addition to a primary wound closure.     Madlyn Frankel Charlann Boxer, M.D.     MDO/MEDQ  D:  07/28/2011  T:  07/28/2011  Job:  [id number]  Electronically Signed by Durene Romans M.D. on 07/29/2011 11:01:11 AM

## 2011-07-30 LAB — BASIC METABOLIC PANEL
BUN: 5 mg/dL — ABNORMAL LOW (ref 6–23)
CO2: 26 mEq/L (ref 19–32)
Calcium: 7.5 mg/dL — ABNORMAL LOW (ref 8.4–10.5)
Chloride: 106 mEq/L (ref 96–112)
Creatinine, Ser: 0.6 mg/dL (ref 0.50–1.10)
GFR calc Af Amer: >90 mL/min (ref >90)
GFR calc non Af Amer: >90 mL/min (ref >90)
Glucose, Bld: 123 mg/dL — ABNORMAL HIGH (ref 70–99)
Potassium: 3.2 mEq/L — ABNORMAL LOW (ref 3.5–5.1)
Sodium: 137 mEq/L (ref 135–145)

## 2011-07-30 LAB — CBC
HCT: 26.2 % — ABNORMAL LOW (ref 36.0–46.0)
Hemoglobin: 8.7 g/dL — ABNORMAL LOW (ref 12.0–15.0)
MCH: 29.5 pg (ref 26.0–34.0)
MCHC: 33.2 g/dL (ref 30.0–36.0)
MCV: 88.8 fL (ref 78.0–100.0)
Platelets: 150 10*3/uL (ref 150–400)
RBC: 2.95 MIL/uL — ABNORMAL LOW (ref 3.87–5.11)
RDW: 13.4 % (ref 11.5–15.5)
WBC: 8.7 10*3/uL (ref 4.0–10.5)

## 2011-07-31 DIAGNOSIS — Z96659 Presence of unspecified artificial knee joint: Secondary | ICD-10-CM

## 2011-07-31 DIAGNOSIS — M1710 Unilateral primary osteoarthritis, unspecified knee: Secondary | ICD-10-CM

## 2011-07-31 LAB — BASIC METABOLIC PANEL
BUN: 5 mg/dL — ABNORMAL LOW (ref 6–23)
CO2: 25 mEq/L (ref 19–32)
Calcium: 8 mg/dL — ABNORMAL LOW (ref 8.4–10.5)
Chloride: 105 mEq/L (ref 96–112)
Creatinine, Ser: 0.53 mg/dL (ref 0.50–1.10)
GFR calc Af Amer: >90 mL/min (ref >90)
GFR calc non Af Amer: >90 mL/min (ref >90)
Glucose, Bld: 119 mg/dL — ABNORMAL HIGH (ref 70–99)
Potassium: 3.2 mEq/L — ABNORMAL LOW (ref 3.5–5.1)
Sodium: 134 mEq/L — ABNORMAL LOW (ref 135–145)

## 2011-08-04 ENCOUNTER — Inpatient Hospital Stay (HOSPITAL_COMMUNITY)
Admission: RE | Admit: 2011-08-04 | Discharge: 2011-08-12 | DRG: 462 | Disposition: A | Discharge status: Home Health | Payer: BC Managed Care – PPO | Source: Ambulatory Visit | Attending: Physical Medicine & Rehabilitation | Admitting: Physical Medicine & Rehabilitation

## 2011-08-04 DIAGNOSIS — E876 Hypokalemia: Secondary | ICD-10-CM

## 2011-08-04 DIAGNOSIS — Z833 Family history of diabetes mellitus: Secondary | ICD-10-CM

## 2011-08-04 DIAGNOSIS — A498 Other bacterial infections of unspecified site: Secondary | ICD-10-CM

## 2011-08-04 DIAGNOSIS — D62 Acute posthemorrhagic anemia: Secondary | ICD-10-CM

## 2011-08-04 DIAGNOSIS — Z5189 Encounter for other specified aftercare: Principal | ICD-10-CM

## 2011-08-04 DIAGNOSIS — E785 Hyperlipidemia, unspecified: Secondary | ICD-10-CM

## 2011-08-04 DIAGNOSIS — K5900 Constipation, unspecified: Secondary | ICD-10-CM

## 2011-08-04 DIAGNOSIS — Z7982 Long term (current) use of aspirin: Secondary | ICD-10-CM

## 2011-08-04 DIAGNOSIS — Z96659 Presence of unspecified artificial knee joint: Secondary | ICD-10-CM

## 2011-08-04 DIAGNOSIS — M1710 Unilateral primary osteoarthritis, unspecified knee: Secondary | ICD-10-CM

## 2011-08-04 DIAGNOSIS — Z79899 Other long term (current) drug therapy: Secondary | ICD-10-CM

## 2011-08-04 DIAGNOSIS — Z8673 Personal history of transient ischemic attack (TIA), and cerebral infarction without residual deficits: Secondary | ICD-10-CM

## 2011-08-04 DIAGNOSIS — I10 Essential (primary) hypertension: Secondary | ICD-10-CM

## 2011-08-04 DIAGNOSIS — R350 Frequency of micturition: Secondary | ICD-10-CM

## 2011-08-04 DIAGNOSIS — K219 Gastro-esophageal reflux disease without esophagitis: Secondary | ICD-10-CM

## 2011-08-04 DIAGNOSIS — N390 Urinary tract infection, site not specified: Secondary | ICD-10-CM

## 2011-08-05 DIAGNOSIS — D62 Acute posthemorrhagic anemia: Secondary | ICD-10-CM

## 2011-08-05 DIAGNOSIS — Z96659 Presence of unspecified artificial knee joint: Secondary | ICD-10-CM

## 2011-08-05 DIAGNOSIS — M1710 Unilateral primary osteoarthritis, unspecified knee: Secondary | ICD-10-CM

## 2011-08-05 LAB — COMPREHENSIVE METABOLIC PANEL
ALT: 20 U/L (ref 0–35)
AST: 14 U/L (ref 0–37)
Albumin: 2.3 g/dL — ABNORMAL LOW (ref 3.5–5.2)
Alkaline Phosphatase: 103 U/L (ref 39–117)
BUN: 10 mg/dL (ref 6–23)
CO2: 28 mEq/L (ref 19–32)
Calcium: 9.2 mg/dL (ref 8.4–10.5)
Chloride: 102 mEq/L (ref 96–112)
Creatinine, Ser: 0.62 mg/dL (ref 0.50–1.10)
GFR calc Af Amer: >90 mL/min (ref >90)
GFR calc non Af Amer: >90 mL/min (ref >90)
Glucose, Bld: 106 mg/dL — ABNORMAL HIGH (ref 70–99)
Potassium: 3.6 mEq/L (ref 3.5–5.1)
Sodium: 137 mEq/L (ref 135–145)
Total Bilirubin: 0.6 mg/dL (ref 0.3–1.2)
Total Protein: 6 g/dL (ref 6.0–8.3)

## 2011-08-05 LAB — DIFFERENTIAL
Basophils Absolute: 0 10*3/uL (ref 0.0–0.1)
Basophils Relative: 1 % (ref 0–1)
Eosinophils Absolute: 0.2 10*3/uL (ref 0.0–0.7)
Eosinophils Relative: 3 % (ref 0–5)
Lymphocytes Relative: 31 % (ref 12–46)
Lymphs Abs: 2.5 10*3/uL (ref 0.7–4.0)
Monocytes Absolute: 0.8 10*3/uL (ref 0.1–1.0)
Monocytes Relative: 10 % (ref 3–12)
Neutro Abs: 4.4 10*3/uL (ref 1.7–7.7)
Neutrophils Relative %: 55 % (ref 43–77)

## 2011-08-05 LAB — CBC
HCT: 27.6 % — ABNORMAL LOW (ref 36.0–46.0)
Hemoglobin: 8.9 g/dL — ABNORMAL LOW (ref 12.0–15.0)
MCH: 29.3 pg (ref 26.0–34.0)
MCHC: 32.2 g/dL (ref 30.0–36.0)
MCV: 90.8 fL (ref 78.0–100.0)
Platelets: 353 10*3/uL (ref 150–400)
RBC: 3.04 MIL/uL — ABNORMAL LOW (ref 3.87–5.11)
RDW: 13.8 % (ref 11.5–15.5)
WBC: 8 10*3/uL (ref 4.0–10.5)

## 2011-08-07 LAB — URINE CULTURE
Colony Count: >=100000
Culture  Setup Time: 201210300444

## 2011-08-07 NOTE — Discharge Summary (Signed)
  NAMEJUSTYCE, [name] NO.:  [id number]  MEDICAL RECORD NO.:  [id number]  LOCATION:  1610                         FACILITY:  [facility]  PHYSICIAN:  Madlyn Frankel. Charlann Boxer, M.D.  DATE OF BIRTH:  May 20, 1951  DATE OF ADMISSION:  07/28/2011 DATE OF DISCHARGE:  08/04/2011                        DISCHARGE SUMMARY - REFERRING   ADDENDUM:  Patient's stay was not discharged on October 26 as originally thought due to CIR acceptance and insurance approval.  Due to the inability that she was unable to be accepted on Saturdays, patient remained throughout the weekend.  Patient was doing fine, no events throughout the whole weekend, afebrile, vital signs stable.  Patient was felt to be doing well enough to be discharged to [facility] today, October 29,2012.  There is no change in her discharge instructions or plan.    ______________________________ [name], PA   ______________________________ Madlyn Frankel. Charlann Boxer, M.D.    MB/MEDQ  D:  08/04/2011  T:  08/04/2011  Job:  [id number]  Electronically Signed by [name] PA on 08/05/2011 02:17:23 PM Electronically Signed by Durene Romans M.D. on 08/07/2011 11:14:27 AM

## 2011-08-07 NOTE — Discharge Summary (Signed)
[name], Thomas NO.:  [id number]  MEDICAL RECORD NO.:  [id number]  LOCATION:  1610                         FACILITY:  [facility]  PHYSICIAN:  Madlyn Frankel. Charlann Boxer, M.D.  DATE OF BIRTH:  Feb 01, 1951  DATE OF ADMISSION:  07/28/2011 DATE OF DISCHARGE:  08/01/2011                        DISCHARGE SUMMARY - REFERRING   PROCEDURE:  Bilateral open total knee arthroplasties.  ATTENDING PHYSICIAN:  Madlyn Frankel. Charlann Boxer, M.D.  ADMITTING DIAGNOSIS:  Bilateral knee osteoarthritis.  DISCHARGE DIAGNOSES: 1. Status post bilateral total knee arthroplasty. 2. Hypertension. 3. Reflux. 4. Hyperlipidemia. 5. History of transient ischemic attack.  HISTORY OF PRESENT ILLNESS:  Patient is a 60 year old lady with a history of osteoarthritis of bilateral knees.  Patient has tried several conservative treatments all of which have failed to alleviate her symptoms.  X-rays in the clinic do show significant bilateral osteoarthritic changes of both knees including bone-on-bone changes. Various options were discussed with the patient.  Patient wished to proceed with surgery.  Risks, benefits, and expectations of procedure were discussed with the patient.  Patient understands risks, benefits, and expectations and wished to proceed with surgery.  HOSPITAL COURSE:  Patient underwent the above-stated procedure on July 28, 2011.  Patient tolerated the procedure well, was brought to the recovery room in good condition, and subsequently to the floor.  Postop day 1, July 29, 2011, patient doing well.  Pain was well- controlled especially with an epidural still in place.  H and H 9.5/28.2.  Dressing good, clean and dry.  She is afebrile.  Vital signs stable.  Patient did well with physical therapy.  Postop day 2, July 30, 2011, patient doing well.  Epidural was removed on bilateral knees.  Dressings looked good.  Hematocrit 26.2. The patient's anticoagulation was started 6 hours after the  epidural was removed.  Postop day 3, July 31, 2011, patient doing well, complaining of some urinary retention, had bladder scan and in and out cath a couple of times.  This eventually did resolve.  The patient's H and H was 8.7/26.2, afebrile, vital signs stable.  Distally neurovascularly intact.  Dressing looked good.  Patient felt well with physical therapy.  Postop day 4, August 01, 2011, patient doing well, afebrile, vital signs stable.  No new labs were done.  Dressings looked good, clean, dry, and intact.  It was felt the patient was doing well enough to be discharged to [facility] and have rehabilitation.  DISCHARGE CONDITION:  Good.  DISCHARGE INSTRUCTIONS:  Patient will be discharged to [facility] and have rehabilitation, should be weightbearing as tolerated on both legs. Patient should maintain her surgical dressing for about 8 days after which time she will replace with gauze and tape.  Patient to keep the area dry and clean until followup.  Patient will follow up at [facility] in 2 weeks.  Patient is to call with any questions or concerns.  DISCHARGE MEDICATIONS: 1. Aspirin enteric-coated 325 mg one p.o. b.i.d. x4 weeks. 2. Benadryl 25 mg one p.o. q.4 hours p.r.n. 3. Colace 100 mg one p.o. b.i.d. constipation. 4. Iron sulfate 325 mg one p.o. t.i.d. x2-3 weeks. 5. Norco 7.5/325, 1-2 p.o. q.4-6 hours p.r.n. pain. 6. Robaxin  500 mg one p.o. q.6 hours p.r.n. muscle spasms. 7. MiraLAX 17 g p.o. b.i.d. constipation. 8. Fish oil one capsule daily. 9. Hydrochlorothiazide 25 mg one p.o. q.a.m. 10.Oxybutynin 5 mg two p.o. q.a.m. 11.Potassium one p.o. q. day. 12.Prilosec 20 mg one p.o. q. day p.r.n. heartburn. 13.Ramipril 2.5 mg one p.o. q.a.m. 14.Simvastatin 40 mg one p.o. q.h.s.    ______________________________ [name], PA   ______________________________ Madlyn Frankel. Charlann Boxer, M.D.    MB/MEDQ  D:  08/01/2011  T:  08/01/2011  Job:  [id number]  Electronically  Signed by [name] PA on 08/05/2011 02:17:15 PM Electronically Signed by Durene Romans M.D. on 08/07/2011 11:14:24 AM

## 2011-08-07 NOTE — H&P (Signed)
[name], [name] NO.:  [id number]  MEDICAL RECORD NO.:  [id number]  LOCATION:  4011                         FACILITY:  [facility]  PHYSICIAN:  Ranelle Oyster, M.D.DATE OF BIRTH:  [birth year]  DATE OF ADMISSION:  08/04/2011 DATE OF DISCHARGE:                             HISTORY & PHYSICAL   CHIEF COMPLAINT:  Bilateral knee pain.  SURGEON:  Madlyn Frankel. Charlann Boxer, MD  PRIMARY CARE PROVIDER:  Kingsley Callander. Ouida Sills, MD  HISTORY OF PRESENT ILLNESS:  This is a 60 year old white female admitted on July 28, 2011, with bilateral DJD of the knees failing conservative measures.  She underwent a bilateral knee replacement on July 28, 2011, by Dr. Charlann Boxer.  She is on Xarelto for 10 days and will be changed to aspirin.  She is weightbearing as tolerated in both legs. She developed postoperative anemia, and this has been monitored only for now and iron was added as a supplement.  Pain controlled has been an issue, but the patient seems to be working through with her hydrocodone. Rehab was consulted and felt this patient could benefit from an inpatient rehab stay due to her active lifestyle and prior level of function.  REVIEW OF SYSTEMS:  Notable for joint swelling.  She has had some mild constipation but generally this has been  controlled.  Full review is in the written H and P.  PAST MEDICAL HISTORY:  Positive for: 1. Hypertension. 2. GERD. 3. Hyperlipidemia. 4. TIA. 5. Breast reduction.  She denies alcohol or tobacco.  FAMILY HISTORY:  Positive for diabetes.  SOCIAL HISTORY:  The patient is married.  Husband can assist.  She works 2 jobs.  She lives in a 1-level house with 1 step to enter.  ALLERGIES:  None.  MEDICATIONS AND LABORATORIES:  Please see written H and P.  PHYSICAL EXAMINATION:  VITAL SIGNS:  Blood pressure is 101/65, pulse 79, respiratory rate is 16, temperature 98.2. GENERAL:  The patient is pleasant, alert, obese. HEENT:  Pupils equal, round,  reactive to light.  Ear, nose, and throat exam is unremarkable.  Pink moist mucosa. NECK:  Supple without JVD or lymphadenopathy. CHEST:  Clear to auscultation bilaterally without wheezes, rales or rhonchi. HEART:  Regular rate and rhythm without murmur, rubs, or gallops. ABDOMEN:  Soft, nontender.  Bowel sounds are positive. SKIN:  Generally intact.  She had occlusive dressings over both knees with no visible drainage seen.  She was able to bend the left knee to about 45 degrees and the right knee to about 40.  The patient with significant pain in both knees and mild swelling seen.  NEUROLOGIC: Cranial nerves II-XII grossly intact.  Reflexes 1+.  Sensation is normal.  Judgment, orientation, memory and mood were all appropriate. Strength is 5/5 in the upper extremities.  Lower extremity strength is 1 to 2/5 at the hip and knees with pain inhibition.  She is 4/5 distally.  POST ADMISSION PHYSICIAN EVALUATION: 1. Functional deficit secondary to bilateral total knee replacement     due to DJD. 2. The patient is admitted to receive collaborative interdisciplinary     care between the physiatrist, rehab nursing staff, and therapy  team. 3. The patient's level of medical complexity and substantial therapy     needs in context of that medical necessity cannot be provided at a     lesser intensity of care. 4. The patient has experienced substantial functional loss from her     baseline.  Premorbidly, she had been independent.  Currently, she     is requiring min to mod assist bed level and min assist transfers     and gait 60 feet with rolling walker, min assist lower body ADLs.     The patient will be able to make substantial gains and measurable     gains based on her current and prior rehab function in this     inpatient rehab setting. 5. Physiatrist will provide 24 hour management of medical needs as     well as oversight of the therapy plan/treatment and provide     guidance as  appropriate regarding interaction of the two.  Medical     problem list and plan are below. 6. The 24-hour Rehab Nursing team will assist in the management of the     patient's skin care needs, as well as bowel and bladder function,     safety awareness, and integration of therapy concepts, and     techniques. 7. PT will assess and treat for lower extremity strength, range of     motion, functional mobility, safety, lower extremity strength,     adaptive equipment training with goals modified independent. 8. OT will assess and treat for upper extremity use, ADLs, adaptive     techniques, equipment, functional mobility, safety, with goals     modified independent. 9. Case Management and Social Worker will treat for psychosocial     issues and discharge planning. 10.Team conference to be held weekly to assess progress towards goals     and to determine barriers at discharge. 11.The patient has demonstrated sufficient medical stability and     exercise capacity to tolerate at least 3 hours of therapy per day     at least 5 days per week. 12.Estimated length of stay is 1 week.  Prognosis is good.  MEDICAL PROBLEM LIST AND PLAN: 1. DVT prophylaxis with Xarelto x10 days.  The patient then will     resume aspirin at home. 2. Postoperative anemia:  Follow up CBC on admission labs and continue     iron supplementation.  No visible signs of blood loss on     examination today. 3. Blood pressure control:  Hydrochlorothiazide and Altace. 4. Hyperlipidemia:  Zocor. 5. Bladder: Check UA C and S as well as check PVRs.  Ditropan on board     for frequency. 6. Hypokalemia:  Check admission electrolytes tomorrow. 7. Pain management p.r.n. Vicodin and Robaxin.  Consider scheduling     Vicodin for more consistent control.     Ranelle Oyster, M.D.     ZTS/MEDQ  D:  08/04/2011  T:  08/05/2011  Job:  [id number]  cc:   Kingsley Callander. Ouida Sills, MD Madlyn Frankel Charlann Boxer, M.D.  Electronically Signed by Faith Rogue M.D. on 08/07/2011 11:15:37 PM

## 2011-08-08 DIAGNOSIS — M1710 Unilateral primary osteoarthritis, unspecified knee: Secondary | ICD-10-CM

## 2011-08-08 DIAGNOSIS — D62 Acute posthemorrhagic anemia: Secondary | ICD-10-CM

## 2011-08-08 DIAGNOSIS — Z96659 Presence of unspecified artificial knee joint: Secondary | ICD-10-CM

## 2011-08-08 MED ORDER — ASPIRIN EC 325 MG PO TBEC
325.0000 mg | DELAYED_RELEASE_TABLET | Freq: Every day | ORAL | Status: DC
  Administered 2011-08-10 – 2011-08-12 (×3): 325 mg via ORAL
  Filled 2011-08-08 (×5): qty 1

## 2011-08-08 MED ORDER — RAMIPRIL 2.5 MG PO CAPS
2.5000 mg | ORAL_CAPSULE | Freq: Every day | ORAL | Status: DC
  Administered 2011-08-10 – 2011-08-12 (×3): 2.5 mg via ORAL
  Filled 2011-08-08 (×5): qty 1

## 2011-08-08 MED ORDER — METHOCARBAMOL 500 MG PO TABS
500.0000 mg | ORAL_TABLET | Freq: Four times a day (QID) | ORAL | Status: DC | PRN
  Administered 2011-08-10 – 2011-08-12 (×5): 500 mg via ORAL
  Filled 2011-08-08 (×3): qty 1

## 2011-08-08 MED ORDER — FERROUS SULFATE 325 (65 FE) MG PO TABS
325.0000 mg | ORAL_TABLET | Freq: Two times a day (BID) | ORAL | Status: DC
  Administered 2011-08-10 – 2011-08-12 (×6): 325 mg via ORAL
  Filled 2011-08-08 (×10): qty 1

## 2011-08-08 MED ORDER — CIPROFLOXACIN HCL 250 MG PO TABS
250.0000 mg | ORAL_TABLET | Freq: Two times a day (BID) | ORAL | Status: DC
  Administered 2011-08-10 – 2011-08-12 (×5): 250 mg via ORAL
  Filled 2011-08-08 (×10): qty 1

## 2011-08-08 MED ORDER — PROMETHAZINE HCL 12.5 MG RE SUPP: 12.5000 mg | Freq: Four times a day (QID) | RECTAL | Status: DC | PRN

## 2011-08-08 MED ORDER — SORBITOL 70 % SOLN: 30.0000 mL | Freq: Two times a day (BID) | Status: DC | PRN

## 2011-08-08 MED ORDER — POLYETHYLENE GLYCOL 3350 17 G PO PACK
17.0000 g | PACK | Freq: Every day | ORAL | Status: DC
  Administered 2011-08-10 – 2011-08-11 (×2): 17 g via ORAL
  Filled 2011-08-08 (×5): qty 1

## 2011-08-08 MED ORDER — SENNOSIDES-DOCUSATE SODIUM 8.6-50 MG PO TABS
2.0000 | ORAL_TABLET | Freq: Every day | ORAL | Status: DC
  Administered 2011-08-10 – 2011-08-11 (×2): 2 via ORAL
  Filled 2011-08-08 (×2): qty 2

## 2011-08-08 MED ORDER — ACETAMINOPHEN 325 MG PO TABS: 325.0000 mg | ORAL_TABLET | ORAL | Status: DC | PRN

## 2011-08-08 MED ORDER — PROMETHAZINE HCL 25 MG/ML IJ SOLN: 12.5000 mg | Freq: Four times a day (QID) | INTRAMUSCULAR | Status: DC | PRN

## 2011-08-08 MED ORDER — PROMETHAZINE HCL 12.5 MG PO TABS: 12.5000 mg | ORAL_TABLET | Freq: Four times a day (QID) | ORAL | Status: DC | PRN

## 2011-08-08 MED ORDER — SIMVASTATIN 40 MG PO TABS
40.0000 mg | ORAL_TABLET | Freq: Every day | ORAL | Status: DC
  Administered 2011-08-10 – 2011-08-11 (×2): 40 mg via ORAL
  Filled 2011-08-08 (×5): qty 1

## 2011-08-08 MED ORDER — HYDROCHLOROTHIAZIDE 25 MG PO TABS
25.0000 mg | ORAL_TABLET | Freq: Every day | ORAL | Status: DC
  Administered 2011-08-10 – 2011-08-12 (×3): 25 mg via ORAL
  Filled 2011-08-08 (×5): qty 1

## 2011-08-08 MED ORDER — OXYBUTYNIN CHLORIDE 5 MG PO TABS
5.0000 mg | ORAL_TABLET | Freq: Every day | ORAL | Status: DC
  Administered 2011-08-10 – 2011-08-12 (×3): 5 mg via ORAL
  Filled 2011-08-08 (×5): qty 1

## 2011-08-08 MED ORDER — BISACODYL 10 MG RE SUPP: 10.0000 mg | Freq: Every day | RECTAL | Status: DC | PRN

## 2011-08-08 MED ORDER — TRAZODONE HCL 50 MG PO TABS: 25.0000 mg | ORAL_TABLET | Freq: Every evening | ORAL | Status: DC | PRN

## 2011-08-08 MED ORDER — HYDROCODONE-ACETAMINOPHEN 5-325 MG PO TABS
1.0000 | ORAL_TABLET | ORAL | Status: DC | PRN
  Administered 2011-08-10 – 2011-08-12 (×4): 1 via ORAL

## 2011-08-10 DIAGNOSIS — Z96659 Presence of unspecified artificial knee joint: Secondary | ICD-10-CM

## 2011-08-10 DIAGNOSIS — K219 Gastro-esophageal reflux disease without esophagitis: Secondary | ICD-10-CM

## 2011-08-10 DIAGNOSIS — E785 Hyperlipidemia, unspecified: Secondary | ICD-10-CM

## 2011-08-10 DIAGNOSIS — N390 Urinary tract infection, site not specified: Secondary | ICD-10-CM

## 2011-08-10 DIAGNOSIS — I10 Essential (primary) hypertension: Secondary | ICD-10-CM

## 2011-08-10 NOTE — Progress Notes (Signed)
  Subjective: No complaints - denies pain  Objective: Vital signs in last 24 hours: Temp:  [98.3 F (36.8 C)] 98.3 F (36.8 C) (11/04 0540) Pulse Rate:  [64] 64  (11/04 0540) Resp:  [20] 20  (11/04 0540) BP: (99)/(60) 99/60 mmHg (11/04 0540) SpO2:  [95 %] 95 % (11/04 0540) Weight change:     Intake/Output from previous day:   Last cbgs: CBG (last 3)  No results found for this basename: GLUCAP:3 in the last 72 hours   Physical Exam Constitutional: She appears well-developed and well-nourished. No distress. Cardiovascular: Normal rate, regular rhythm and normal heart sounds.  No murmur heard. No BLE edema. Pulmonary/Chest: Effort normal and breath sounds normal. No respiratory distress. She has no wheezes.  MSkel: dressing C/D/I B knees, good range of motion  Psychiatric: She has a normal mood and affect.  Judgment and thought content normal.     Lab Results: No results found for this basename: WBC:2,HGB:2,HCT:2,PLT:2 in the last 72 hours BMET No results found for this basename: NA:2,K:2,CL:2,CO2:2,GLUCOSE:2,BUN:2,CREATININE:2,CALCIUM:2 in the last 72 hours  Studies/Results: No results found.  Medications: I have reviewed the patient's current medications.  Assessment/Plan: Patient Active Hospital Problem List: S/P TKR (total knee replacement)    Assessment: stable   Plan: continue CIR Hypertension   Assessment: stable    Plan: The current medical regimen is effective;  continue present plan and medications. Dyslipidemia    The current medical regimen is effective;  continue present plan and medications.  GERD (gastroesophageal reflux disease)  UTI (lower urinary tract infection)    Continue Cipro     LOS: 6 days   Rene Paci , MD 08/10/2011, 8:10 AM

## 2011-08-10 NOTE — Progress Notes (Signed)
Physical Therapy Session Note  Patient Details  Name: [name] MRN: [id number] Date of Birth: 12/19/1950  Today's Date: 08/10/2011 Time:  - 1100-1200    Precautions:  fall risk  Short Term Goals: = LTG    Skilled Therapeutic Interventions/Progress Updates: Pt tolerated treatment session well, making great progress. Pt functioning at overall Mod I level for mobility with RW. Discussed potential to change d/c date with pt due to progress and will further discuss with team. Pt with improvements in bilateral knee ROM but still limited during functional mobility. Discussed  Follow up therapy options with pt as well.      General Chart Reviewed: Yes Vital Signs   Pain Pain Assessment Pain Assessment: 0-10 Pain Score:   4 Pain Type: Surgical pain Pain Location: Leg Pain Orientation: Other (Comment) (both) Pain Descriptors: Spasm Pain Frequency: Intermittent Pain Onset: With Activity Patients Stated Pain Goal: 0 Pain Intervention(s): Medication (See eMAR) Multiple Pain Sites: Yes Mobility Bed Mobility Bed Mobility: No Transfers Transfers: Yes Stand Pivot Transfers: 6: Modified independent (Device/Increase time) Locomotion  Ambulation Ambulation: Yes Ambulation/Gait Assistance: 6: Modified independent (Device/Increase time) Ambulation Distance (Feet): 220 Feet Assistive device: Rolling walker Gait Gait: Yes Gait Pattern: Decreased hip/knee flexion - left;Decreased hip/knee flexion - right (decreased knee flexion bilaterally noted in swing phase) High Level Ambulation High Level Ambulation: Backwards walking;Direction changes Backwards Walking: within home environment mod I Direction Changes: within home environment mod I Stairs / Additional Locomotion Stairs: Yes Stairs Assistance: 5: Supervision Stairs Assistance Details: Verbal cues for technique (cues to increase knee flexion versus circumduction at hip) Stair Management Technique: Two rails Number of Stairs:  5  Curb: 5: Supervision;Other (comment) (with RW) Wheelchair Mobility Wheelchair Mobility: No   Exercises  Nustep level 5 x 5 min for bilateral ROM, general activity tolerance, and strenthening  Other Treatments Treatments Therapeutic Activity: simulated car transfer from [employer] with supervision. Cueing for technique and recommendations of bringing seat back to increase lower extremity clearance  Therapy/Group: Individual Therapy  Karolee Stamps [facility] 08/10/2011, 12:07 PM

## 2011-08-11 MED ORDER — HYDROCODONE-ACETAMINOPHEN 5-325 MG PO TABS: 1.0000 | ORAL_TABLET | ORAL | Status: AC | PRN

## 2011-08-11 MED ORDER — METHOCARBAMOL 500 MG PO TABS: 500.0000 mg | ORAL_TABLET | Freq: Four times a day (QID) | ORAL | Status: AC | PRN

## 2011-08-11 MED ORDER — FERROUS SULFATE 325 (65 FE) MG PO TABS: 325.0000 mg | ORAL_TABLET | Freq: Two times a day (BID) | ORAL | Status: DC

## 2011-08-11 MED ORDER — CIPROFLOXACIN HCL 250 MG PO TABS: 250.0000 mg | ORAL_TABLET | Freq: Two times a day (BID) | ORAL | Status: AC

## 2011-08-11 NOTE — Discharge Summary (Signed)
[name], [name] NO.:  [id number]  MEDICAL RECORD NO.:  [id number]  LOCATION:  4011                         FACILITY:  [facility]  PHYSICIAN:  Ranelle Oyster, M.D.DATE OF BIRTH:  [birth year]  DATE OF ADMISSION:  08/04/2011 DATE OF DISCHARGE:                              DISCHARGE SUMMARY   DISCHARGE DIAGNOSES: 1. Bilateral total knee replacement secondary to degenerative joint     disease. 2. Xarelto for deep vein thrombosis prophylaxis. 3. Postoperative anemia. 4. Hypertension. 5. Hyperlipidemia. 6. Hypokalemia, resolved. 7. Pain management.  A 60 year old female admitted on July 28, 2011, with bilateral osteoarthritis of the knees failing conservative care.  She ultimately underwent bilateral knee replacements on July 28, 2011, by Dr. Charlann Boxer. She was placed on Xarelto for 10 days and will be converted to aspirin after 10 days.  She was weightbearing as tolerated.  She developed postoperative anemia and monitored.  She had been placed on iron supplement.  Pain control had been an issue and she was doing relatively well with hydrocodone.  She was seen by inpatient rehab services, felt to be a good candidate for comprehensive rehab.  PAST MEDICAL HISTORY:  See discharge diagnoses.  SOCIAL HISTORY:  She is married.  Husband can assist.  She works 2 jobs. She lives in a 1-level home with 1 step to entry.  ALLERGIES:  None.  FAMILY HISTORY:  Positive for diabetes.  SOCIAL HISTORY:  She does not smoke or drink any alcohol.  MEDICATIONS PRIOR TO ADMISSION:  Hydrochlorothiazide as well as Altace. She was using Ditropan as needed for bladder.  PHYSICAL EXAMINATION:  VITAL SIGNS:  Blood pressure 101/65, pulse 79, respirations 16, temperature 98.2. GENERAL:  This was an alert female in no acute distress, oriented x3. Deep tendon reflexes were 2+.  Sensation intact to light touch. Surgical site healing nicely with bilateral Steri-Strips in  place. LUNGS:  Clear to auscultation. CARDIAC:  Regular rate and rhythm. ABDOMEN:  Soft, nontender.  Good bowel sounds.  REHABILITATION HOSPITAL COURSE:  The patient was admitted to inpatient rehab services with therapies initiated on a 3-hour daily basis consisting of physical therapy, occupational therapy, and 24-hour rehabilitation nursing.  The following issues were addressed during the patient's rehabilitation stay.  Pertaining to Ms. [name]'s bilateral total knee replacement, neurovascular sensation intact.  Surgical sites healing nicely with Steri-Strips in place.  She had completed her Xarelto for deep vein thrombosis prophylaxis, converted to aspirin therapy.  She remained on pain control with Norco as well as Robaxin on an as-needed basis with good pain relief.  Her blood pressures were well controlled on hydrochlorothiazide and Altace with no orthostatic changes and she would follow up with her primary care provider.  Zocor ongoing for history of hyperlipidemia.  Postoperative anemia stable.  She was not transfused.  She remained on iron supplement.  She had no chest pain, shortness of breath, or orthostatic changes.  No bowel or bladder disturbances.  Some subtle constipation that was resolved with laxative assistance.  The patient received weekly collaborative interdisciplinary team conferences to discuss estimated length of stay, family teaching, and any barriers to discharge.  She was modified independent with her room  with an assistive device.  She exhibited no unsafe behavior. Independent with activities of daily living except needing some minimal assist for lower body dressing with her socks.  She was advised no driving.  Home health therapies had been arranged as per rehab Services.  DISCHARGE MEDICATIONS: 1. Tylenol 650 mg every 4 hours as needed for pain or fever. 2. Aspirin 325 mg p.o. daily. 3. Calcium supplement 500 mg daily. 4. Cipro 250 mg twice daily x3  more days for wound prophylaxis. 5. Vitamin B12 one tablet daily. 6. Ferrous sulfate 325 mg 1 tab twice daily with meals. 7. Fish oil 1 tablet daily. 8. Hydrochlorothiazide 25 mg p.o. daily. 9. Norco 5/325 one or two tablets every 4 hours as needed for pain,     dispense of 90 tablets. 10.Robaxin 500 mg every 6 hours as needed for muscle spasms, dispense     of 60 tablets. 11.Prilosec 20 mg daily. 12.Ditropan 10 mg p.o. daily. 13.MiraLAX 17 g with 8 ounces of water daily, hold for loose stool as     needed. 14.Potassium 20 mEq daily. 15.Altace 2.5 mg daily. 16.Zocor 40 mg p.o. at bedtime.  DIET:  Regular.  SPECIAL INSTRUCTIONS:  No driving.  The patient may shower. Instructions were made for the patient to follow up Dr. Charlann Boxer in 2 weeks with orthopedic care.  Should follow up with Dr. Faith Rogue at the outpatient rehab service office as needed.  Home health therapies had been arranged as per rehab services.     Mariam Dollar, P.A.   ______________________________ Ranelle Oyster, M.D.    DA/MEDQ  D:  08/11/2011  T:  08/11/2011  Job:  [id number]  cc:   Madlyn Frankel Charlann Boxer, M.D. Kingsley Callander. Ouida Sills, MD

## 2011-08-11 NOTE — Progress Notes (Signed)
Physical Therapy Session Note  Patient Details  Name: [name] MRN: [id number] Date of Birth: 28-Oct-1950  Today's Date: 08/11/2011 Time: 4098-1191 Time Calculation (min): 30 min  Precautions: Restrictions Weight Bearing Restrictions: Yes RLE Weight Bearing: Weight bearing as tolerated LLE Weight Bearing: Weight bearing as tolerated          General Chart Reviewed: Yes   Pain Pain Assessment Pain Assessment: No/denies pain Pain Score: 0-No pain Pain Type:  (a little soreness) Mobility Bed Mobility Bed Mobility: Yes Supine to Sit: 6: Modified independent (Device/Increase time) Transfers Transfers: Yes Sit to Stand: 6: Modified independent (Device/Increase time) Stand to Sit: 6: Modified independent (Device/Increase time) Stand Pivot Transfers: 6: Modified independent (Device/Increase time) Locomotion  Ambulation Ambulation: Yes Ambulation/Gait Assistance: 6: Modified independent (Device/Increase time) Ambulation Distance (Feet): 200 Feet Assistive device: Rolling walker Gait Gait Pattern: Decreased hip/knee flexion - right;Decreased hip/knee flexion - left (keeps trunk in slight flexion) High Level Ambulation High Level Ambulation: Direction changes;Head turns (home environment gait scenarios) Stairs / Additional Locomotion Stairs: Yes Stairs Assistance: 5: Supervision Stair Management Technique: Two rails Number of Stairs: 10  Curb: 5: Supervision (with RW) Wheelchair Mobility Wheelchair Mobility: No     Other Treatments Treatments Therapeutic Activity: Wii Bowling without upper extremity support for Bowling to work on balance, standing tolerance, and general activity tolerance  Therapy/Group: Individual Therapy  Karolee Stamps [facility] 08/11/2011, 4:31 PM

## 2011-08-11 NOTE — Progress Notes (Signed)
Social Work  Team feels pt met goals and ready for discharge tomorrow. Checked with PA and MD acknowledge no medical issues and ready for discharge. Discussed DME and HomeHealth followup. Pt wants Home Health than to transition to Outpatient Thearapies. [facility] referral made on acute with contact them to inform of discharge tom. Pt has all DME, set for discharge tomorrow. Pleased with progress while here.

## 2011-08-11 NOTE — Progress Notes (Signed)
Occupational Therapy Session Note  Patient Details  Name: [name] MRN: [id number] Date of Birth: November 21, 1950  Today's Date: 08/11/2011 Time: 1191-4782 Time Calculation (min): 55 min  Precautions: Restrictions Weight Bearing Restrictions: Yes RLE Weight Bearing: Weight bearing as tolerated LLE Weight Bearing: Weight bearing as tolerated   Skilled Therapeutic Interventions/Progress Updates:  Treatment focus on donning/doffing socks and shoes to bilateral feet using AE (reacher and long handled shoe horn) to increase independence. Also supplied patient with elastic shoe strings to increase independence with LB dressing. Functional ambulation to  ADL apartment for therapeutic activity focusing on functional mobility with rolling walker, dynamic standing balance/endurance, and increasing activity tolerance while incorporating energy conservation techniques prn (see below under other treatments for more information).    General General Chart Reviewed: Yes Family/Caregiver Present: No  Pain None  ADL ADL Equipment Provided: Reacher;Sock aid;Long-handled shoe horn;Long-handled sponge Eating: Independent Where Assessed-Eating: Chair Grooming: Independent Where Assessed-Grooming: Standing at sink Upper Body Bathing: Independent Where Assessed-Upper Body Bathing: Shower Lower Body Bathing: Independent Where Assessed-Lower Body Bathing: Shower Upper Body Dressing: Independent Where Assessed-Upper Body Dressing: Chair Lower Body Dressing: Modified independent Where Assessed-Lower Body Dressing: Chair Toileting: Modified independent Where Assessed-Toileting: Teacher, adult education: Modified Community education officer Method: Proofreader: Other (comment) (elevated toilet seat) Tub/Shower Transfer: Modified independent Tub/Shower Transfer Method: Ship broker: Transfer tub bench   Other Treatments Treatments Therapeutic Activity:  therapeutic activity stipping down and making up full size bed in ADL apartment for focus on increasing independence with home managment tasks. Cueing required for rest breaks as needed. Recommendations made to sit for breaks prn and sit to accomplish various tasks if applicable.   Therapy/Group: Individual Therapy  Blue Ruggerio 08/11/2011, 12:20 PM

## 2011-08-11 NOTE — Progress Notes (Signed)
Physical Therapy Discharge Summary  Patient Details  Name: [name] MRN: [id number] Date of Birth: October 30, 1950 Today's Date: 08/11/2011  Patient has met 7 of 7 long term goals due to improved activity tolerance, improved balance, improved postural control, increased strength, increased range of motion and decreased pain.  Patient to discharge at an ambulatory level Modified Independent.   Patient's husband will be able to provide assistance at discharge, but pt overall mod I with RW.   Recommendation:  Patient will benefit from ongoing skilled PT services in home health setting to continue to advance safe functional mobility, address ongoing impairments in balance, gait, strength, ROM, and minimize fall risk.  Equipment: No equipment provided Pt already owns RW  Patient/family agrees with progress made and goals achieved: Yes  Tedd Sias 08/11/2011, 3:00 PM

## 2011-08-11 NOTE — Progress Notes (Signed)
  Subjective/Complaints: No new complaints  Objective: Vital Signs: Blood pressure 123/53, pulse 74, temperature 98.6 F (37 C), temperature source Oral, resp. rate 18, height 5\' 9"  (1.753 m), weight 120.2 kg (264 lb 15.9 oz), SpO2 95.00%. No results found. No results found for this basename: WBC:2,HGB:2,HCT:2,PLT:2 in the last 72 hours No results found for this basename: NA:2,K:2,CL:2,CO2:2,GLUCOSE:2,BUN:2,CREATININE:2,CALCIUM:2 in the last 72 hours  Physical Exam: General appearance: alert Head: Normocephalic, without obvious abnormality, atraumatic Eyes: conjunctivae/corneas clear. PERRL, EOM's intact. Fundi benign. Ears: normal TM's and external ear canals both ears Nose: Nares normal. Septum midline. Mucosa normal. No drainage or sinus tenderness. Throat: lips, mucosa, and tongue normal; teeth and gums normal Neck: no adenopathy, no carotid bruit, no JVD, supple, symmetrical, trachea midline and thyroid not enlarged, symmetric, no tenderness/mass/nodules Back: symmetric, no curvature. ROM normal. No CVA tenderness. Resp: clear to auscultation bilaterally Cardio: regular rate and rhythm, S1, S2 normal, no murmur, click, rub or gallop GI: soft, non-tender; bowel sounds normal; no masses,  no organomegaly Extremities: Homans sign is negative, no sign of DVT Pulses: 2+ and symmetric Skin: Skin color, texture, turgor normal. No rashes or lesions Neurologic: Grossly normal Incision/Wound: sites healing nicely   Assessment/Plan: 1. Functional deficits secondary to bilat knee replacement which require 3+ hours per day of interdisciplinary therapy in a comprehensive inpatient rehab setting. Physiatrist is providing close team supervision and 24 hour management of active medical problems listed below. Physiatrist and rehab team continue to assess barriers to discharge/monitor patient progress toward functional and medical goals. Mobility: Bed Mobility Bed Mobility:  No Transfers Transfers: Yes Stand Pivot Transfers: 6: Modified independent (Device/Increase time) Ambulation/Gait Ambulation/Gait Assistance: 6: Modified independent (Device/Increase time) Ambulation Distance (Feet): 220 Feet Assistive device: Rolling walker Gait Pattern: Decreased hip/knee flexion - left;Decreased hip/knee flexion - right (decreased knee flexion bilaterally noted in swing phase) Stairs: Yes Stairs Assistance: 5: Supervision Stair Management Technique: Two rails Number of Stairs: 5  Wheelchair Mobility Wheelchair Mobility: No ADL:   Cognition:      2. Anticoagulation/DVT prophylaxis with Pharmaceutical: xarelto  3. Pain Management: norco,robaxin  Patient Active Hospital Problem List: S/P TKR (total knee replacement) (08/10/2011)    POA: Not Applicable   Assessment:Needs better knee ROM.      Plan: Staff will continue to address especially on RLE. Hypertension (08/10/2011)    POA: Unknown   Assessment: The current medical regimen is effective;  continue present plan and medications.   Plan:  Dyslipidemia (08/10/2011)    POA: Unknown   Assessment: The current medical regimen is effective;  continue present plan and medications.   Plan:  GERD (gastroesophageal reflux disease) (08/10/2011)    POA: Unknown   Assessment: The current medical regimen is effective;  continue present plan and medications.   Plan:  UTI (lower urinary tract infection) (08/10/2011)    POA: Unknown   Assessment: The current medical regimen is effective   Plan: Cipro   7  ANGIULLI,DANIEL J. 08/11/2011, 7:37 AM

## 2011-08-11 NOTE — Progress Notes (Signed)
Modified independent in the room, with rolling walker.  Continent of bowel and bladder. Last bowel movement 08/09/11. Bilateral knees with dressing C.D.I. BLE with mild edema. Pain controlled with Robaxin 500 mg x 1, in addition to Vicodin x 1 tab. No unsafe behavior. Calls appropriately for assist.

## 2011-08-11 NOTE — Progress Notes (Signed)
Called Olegario Messier at [facility] with a clinical update. Informed her of plan for pt's discharge tomorrow.

## 2011-08-11 NOTE — Progress Notes (Signed)
Occupational Therapy Discharge Summary  Patient Details  Name: [name] MRN: [id number] Date of Birth: 24-Dec-1950 Today's Date: 08/11/2011  Patient has met 9 of 9 long term goals due to improved activity tolerance and improved balance.  Patient's care partner is independent to provide the necessary intermittent S prn assistance at discharge.    Recommendation:  No additional occupational therapy recommended at this time. Recommend patient purchase Hip Kit with reacher, sock aid, long handled sponge, and long handled shoe horn to increase overall independence with ADLs.   Equipment: No equipment provided; per patient report, she currently has tub transfer bench and elevated toilet seats at home.  Patient/family agrees with progress made and goals achieved: Yes  Yichen Gilardi 08/11/2011, 12:51 PM

## 2011-08-11 NOTE — Progress Notes (Addendum)
Social Work Discharge Note Discharge Note  The overall goal for the admission was met for:   Discharge location: [facility] with husband who can provide assist  Length of Stay: Yes-8 days  Discharge activity level: Yes  Home/community participation: Yes  Services provided included: MD, RD, PT, OT, RN, CM, TR, Pharmacy and SW  Financial Services: Private Insurance: [email or web address]  Follow-up services arranged: Home Health: Armed forces logistics/support/administrative officer  Comments (or additional information):PT,OT  Patient/Family verbalized understanding of follow-up arrangements: Yes  Individual responsible for coordination of the follow-up plan: Self/Husband  Confirmed correct DME delivered: Lucy Chris 08/11/2011    Lucy Chris

## 2011-08-11 NOTE — Progress Notes (Signed)
Occupational Therapy Session Note  Patient Details  Name: [name] MRN: [id number] Date of Birth: 08-28-1951  Today's Date: 08/11/2011 Time: 1420-1505 Time Calculation (min): 45 min  Precautions: Restrictions Weight Bearing Restrictions: Yes RLE Weight Bearing: Weight bearing as tolerated LLE Weight Bearing: Weight bearing as tolerated   Skilled Therapeutic Interventions/Progress Updates:    Therapeutic activity focusing on home management tasks with emphasis on increasing activity tolerance, functional mobility with rolling walker, and dynamic standing balance/tolerance/endurance. Discussed kitchen set-up and kitchen tasks with patient. Patient verbally stated she understood and agreed to recommendations. Therapeutic exercise focusing on UE strengthening, increasing overall endurance/activity tolerance, and dynamic standing.   General General Chart Reviewed: Yes Family/Caregiver Present: No  Pain Pain Assessment Pain Assessment: No/denies pain Pain Score: 0-No pain Pain Type:  (a little soreness)  Therapy/Group: Individual Therapy  Sharona Rovner 08/11/2011, 3:23 PM

## 2011-08-12 DIAGNOSIS — M1710 Unilateral primary osteoarthritis, unspecified knee: Secondary | ICD-10-CM

## 2011-08-12 DIAGNOSIS — Z96659 Presence of unspecified artificial knee joint: Secondary | ICD-10-CM

## 2011-08-12 DIAGNOSIS — D62 Acute posthemorrhagic anemia: Secondary | ICD-10-CM

## 2011-08-12 NOTE — Progress Notes (Signed)
Patient discharged to home with family at 1100. Discharge instructions given and reviewed by D. Anguilli,PA . Patient verbalized understanding of instructions.                [name]

## 2011-08-12 NOTE — Progress Notes (Signed)
  Subjective/Complaints: No new complaints  Objective: Vital Signs: Blood pressure 116/62, pulse 71, temperature 97.2 F (36.2 C), temperature source Oral, resp. rate 20, height 5\' 9"  (1.753 m), weight 120.2 kg (264 lb 15.9 oz), SpO2 96.00%. No results found. No results found for this basename: WBC:2,HGB:2,HCT:2,PLT:2 in the last 72 hours No results found for this basename: NA:2,K:2,CL:2,CO2:2,GLUCOSE:2,BUN:2,CREATININE:2,CALCIUM:2 in the last 72 hours  Physical Exam: General appearance: alert Head: Normocephalic, without obvious abnormality, atraumatic Eyes: conjunctivae/corneas clear. PERRL, EOM's intact. Fundi benign. Ears: normal TM's and external ear canals both ears Nose: Nares normal. Septum midline. Mucosa normal. No drainage or sinus tenderness. Throat: lips, mucosa, and tongue normal; teeth and gums normal Neck: no adenopathy, no carotid bruit, no JVD, supple, symmetrical, trachea midline and thyroid not enlarged, symmetric, no tenderness/mass/nodules Back: symmetric, no curvature. ROM normal. No CVA tenderness. Resp: clear to auscultation bilaterally Cardio: regular rate and rhythm, S1, S2 normal, no murmur, click, rub or gallop GI: soft, non-tender; bowel sounds normal; no masses,  no organomegaly Extremities: Homans sign is negative, no sign of DVT Pulses: 2+ and symmetric Skin: Skin color, texture, turgor normal. No rashes or lesions Neurologic: Grossly normal Incision/Wound: sites healing nicely   Assessment/Plan: 1. Functional deficits secondary to bilat knee replacement which require 3+ hours per day of interdisciplinary therapy in a comprehensive inpatient rehab setting. Physiatrist is providing close team supervision and 24 hour management of active medical problems listed below. Physiatrist and rehab team continue to assess barriers to discharge/monitor patient progress toward functional and medical goals.  2. Discharge home today.  Mobility: Bed Mobility Bed  Mobility: Yes Supine to Sit: 6: Modified independent (Device/Increase time) Transfers Transfers: Yes Sit to Stand: 6: Modified independent (Device/Increase time) Stand to Sit: 6: Modified independent (Device/Increase time) Stand Pivot Transfers: 6: Modified independent (Device/Increase time) Ambulation/Gait Ambulation/Gait Assistance: 6: Modified independent (Device/Increase time) Ambulation Distance (Feet): 200 Feet Assistive device: Rolling walker Gait Pattern: Decreased hip/knee flexion - right;Decreased hip/knee flexion - left (keeps trunk in slight flexion) Stairs: Yes Stairs Assistance: 5: Supervision Stair Management Technique: Two rails Number of Stairs: 10  Wheelchair Mobility Wheelchair Mobility: No ADL:   Cognition: Cognition Overall Cognitive Status: Appears within functional limits for tasks assessed Arousal/Alertness: Awake/alert Orientation Level: Oriented X4 Attention: Divided Divided Attention: Appears intact Memory: Appears intact Awareness: Appears intact Problem Solving: Appears intact Safety/Judgment: Appears intact Cognition Arousal/Alertness: Awake/alert Orientation Level: Oriented X4  2. Anticoagulation/DVT prophylaxis with Pharmaceutical: xarelto  3. Pain Management: norco,robaxin  Patient Active Hospital Problem List: S/P TKR (total knee replacement) (08/10/2011)    POA: Not Applicable   Assessment:Needs better knee ROM.      Plan: Home therapy arranged. Hypertension (08/10/2011)    POA: Unknown   Assessment: Pressures controlled.   Plan: Continue hctz and altace. Follow up PCP Dyslipidemia (08/10/2011)    POA: Unknown   Assessment: Zocor   Plan: low fat diet. GERD (gastroesophageal reflux disease) (08/10/2011)    POA: Unknown   Assessment: No nausea of reflux   Plan:  UTI (lower urinary tract infection) (08/10/2011)    POA: Unknown   Assessment: The current medical regimen is effective   Plan: Cipro for three more  days.   8  ANGIULLI,DANIEL J. 08/12/2011, 7:18 AM

## 2011-08-12 NOTE — Progress Notes (Signed)
   Subjective/Complaints: No new complaints. Excited to go home  Objective: Vital Signs: Blood pressure 116/62, pulse 71, temperature 97.2 F (36.2 C), temperature source Oral, resp. rate 20, height 5\' 9"  (1.753 m), weight 120.2 kg (264 lb 15.9 oz), SpO2 96.00%. No results found. No results found for this basename: WBC:2,HGB:2,HCT:2,PLT:2 in the last 72 hours No results found for this basename: NA:2,K:2,CL:2,CO2:2,GLUCOSE:2,BUN:2,CREATININE:2,CALCIUM:2 in the last 72 hours  Physical Exam: General appearance: alert Head: Normocephalic, without obvious abnormality, atraumatic Eyes: conjunctivae/corneas clear. PERRL, EOM's intact. Fundi benign. Ears: normal TM's and external ear canals both ears Nose: Nares normal. Septum midline. Mucosa normal. No drainage or sinus tenderness. Throat: lips, mucosa, and tongue normal; teeth and gums normal Neck: no adenopathy, no carotid bruit, no JVD, supple, symmetrical, trachea midline and thyroid not enlarged, symmetric, no tenderness/mass/nodules Back: symmetric, no curvature. ROM normal. No CVA tenderness. Resp: clear to auscultation bilaterally Cardio: regular rate and rhythm, S1, S2 normal, no murmur, click, rub or gallop GI: soft, non-tender; bowel sounds normal; no masses,  no organomegaly Extremities: Homans sign is negative, no sign of DVT Pulses: 2+ and symmetric Skin: Skin color, texture, turgor normal. No rashes or lesions Neurologic: Grossly normal Incision/Wound: sites healing nicely   Assessment/Plan: 1. Functional deficits secondary to bilat knee replacement which require 3+ hours per day of interdisciplinary therapy in a comprehensive inpatient rehab setting. Physiatrist is providing close team supervision and 24 hour management of active medical problems listed below. Physiatrist and rehab team continue to assess barriers to discharge/monitor patient progress toward functional and medical goals.  2. Discharge home  today.  Mobility: Bed Mobility Bed Mobility: Yes Supine to Sit: 6: Modified independent (Device/Increase time) Transfers Transfers: Yes Sit to Stand: 6: Modified independent (Device/Increase time) Stand to Sit: 6: Modified independent (Device/Increase time) Stand Pivot Transfers: 6: Modified independent (Device/Increase time) Ambulation/Gait Ambulation/Gait Assistance: 6: Modified independent (Device/Increase time) Ambulation Distance (Feet): 200 Feet Assistive device: Rolling walker Gait Pattern: Decreased hip/knee flexion - right;Decreased hip/knee flexion - left (keeps trunk in slight flexion) Stairs: Yes Stairs Assistance: 5: Supervision Stair Management Technique: Two rails Number of Stairs: 10  Wheelchair Mobility Wheelchair Mobility: No ADL:   Cognition: Cognition Overall Cognitive Status: Appears within functional limits for tasks assessed Arousal/Alertness: Awake/alert Orientation Level: Oriented X4 Attention: Divided Divided Attention: Appears intact Memory: Appears intact Awareness: Appears intact Problem Solving: Appears intact Safety/Judgment: Appears intact Cognition Arousal/Alertness: Awake/alert Orientation Level: Oriented X4  2. Anticoagulation/DVT prophylaxis with Pharmaceutical: xarelto completed.  Now on asa.  KHT's.  Ambulation  3. Pain Management: norco,robaxin  Patient Active Hospital Problem List: S/P TKR (total knee replacement) (08/10/2011)    POA: Not Applicable   Assessment:Needs better knee ROM.      Plan: Home therapy arranged. Patient will follow up with ortho and pcp Hypertension (08/10/2011)    POA: Unknown   Assessment: Pressures controlled.   Plan: Continue hctz and altace. Follow up PCP Dyslipidemia (08/10/2011)    POA: Unknown   Assessment: Zocor   Plan: low fat diet. GERD (gastroesophageal reflux disease) (08/10/2011)    POA: Unknown   Assessment: No nausea of reflux   Plan:  UTI (lower urinary tract infection) (08/10/2011)     POA: Unknown   Assessment: The current medical regimen is effective   Plan: Cipro to completion.   8  [name] T 08/12/2011, 8:51 AM

## 2011-09-09 ENCOUNTER — Ambulatory Visit (HOSPITAL_COMMUNITY): Payer: BC Managed Care – PPO | Admitting: Physical Therapy

## 2011-09-10 ENCOUNTER — Ambulatory Visit (HOSPITAL_COMMUNITY)
Admission: RE | Admit: 2011-09-10 | Discharge: 2011-09-10 | Disposition: A | Discharge status: Home / Self Care | Payer: BC Managed Care – PPO | Source: Ambulatory Visit | Attending: Orthopedic Surgery | Admitting: Orthopedic Surgery

## 2011-09-10 DIAGNOSIS — R29898 Other symptoms and signs involving the musculoskeletal system: Secondary | ICD-10-CM | POA: Insufficient documentation

## 2011-09-10 DIAGNOSIS — I10 Essential (primary) hypertension: Secondary | ICD-10-CM | POA: Insufficient documentation

## 2011-09-10 DIAGNOSIS — M6281 Muscle weakness (generalized): Secondary | ICD-10-CM | POA: Insufficient documentation

## 2011-09-10 DIAGNOSIS — R262 Difficulty in walking, not elsewhere classified: Secondary | ICD-10-CM | POA: Insufficient documentation

## 2011-09-10 DIAGNOSIS — M25669 Stiffness of unspecified knee, not elsewhere classified: Secondary | ICD-10-CM | POA: Insufficient documentation

## 2011-09-10 DIAGNOSIS — M25569 Pain in unspecified knee: Secondary | ICD-10-CM | POA: Insufficient documentation

## 2011-09-10 DIAGNOSIS — IMO0001 Reserved for inherently not codable concepts without codable children: Secondary | ICD-10-CM | POA: Insufficient documentation

## 2011-09-10 NOTE — Progress Notes (Signed)
Physical Therapy Evaluation  Patient Details  Name: [name] MRN: [id number] Date of Birth: July 18, 1951  Today's Date: 09/10/2011 Time: 1308-6578 Time Calculation (min): 47 min Visit#: 1  of 12   Re-eval: 10/10/11    Past Medical History: No past medical history on file. Past Surgical History: No past surgical history on file.  Subjective Symptoms/Limitations Symptoms: [name] states that she had bilateral total knee replacement on Oct. 22nd.  Pt states that prior to the surgery she was not using an assistive device.  She states that the right knee is not able to bend as much as the left at this time.  She is having slight difficulty getting out of chairs. The patient has been referred to Pt fo return her prior functional level. How long can you sit comfortably?: The patient states that she is able to sit for an hour when she starts feeling like she needs to get up;Marland Kitchen How long can you stand comfortably?: The patient states that she has been able to stand 30 before she wants to take a break. How long can you walk comfortably?: The patient states she is not walking as fast as she normally does and the longest time that she has walked has been a half hour she was pushing a cart at this point. Special Tests: The patient is not sleeping in the bed yet she is sleeping in her recliner.  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 Leisure: Hobbies-yes (Comment) Comments: walking but she has not been able to do this for a long  time.  Sensation/Coordination/Flexibility    Assessment RLE AROM (degrees) Right Knee Extension 0-130: 3  Right Knee Flexion 0-140: 90  RLE PROM (degrees) Right Knee Extension 0-130: 0  Right Knee Flexion 0-140: 95  RLE Strength Right Hip Flexion: 5/5 Right Hip Extension: 3-/5 Right Hip ABduction: 3+/5 Right Hip ADduction: 5/5 Right Knee Flexion: 5/5 Right Knee Extension: 3+/5 Right Ankle Dorsiflexion: 5/5 LLE AROM (degrees) Left Knee Extension 0-130: 3  Left Knee Flexion 0-140: 85  LLE PROM (degrees) Left Knee Extension 0-130: 0 Left Knee Flexion 0-140: 88 LLE Strength Left Hip Flexion: 5/5 Left Hip Extension: 3-/5 Left Hip ABduction: 3/5 Left Hip ADduction: 5/5 Left Knee Flexion:  (4-/5) Left Knee Extension: 4/5 Left Ankle Dorsiflexion: 5/5  Exercise/Treatments Aerobic Stationary Bike: 5' rocking Supine Heel Slides: 5 reps;Both Sidelying Hip ABduction: 10 reps;Both Prone  Hamstring Curl: 10 reps Hip Extension: 10 reps   Physical Therapy Assessment and Plan PT Assessment and Plan Clinical Impression Statement: Pt with B knee replacement who's main deficit is lack of ROM.  Pt will benefit from skilled PT to return the patient to prior functinal level. Rehab Potential: Good Clinical Impairments Affecting Rehab Potential: stiffness of knees, decreased LE strength, edema, pain. PT Frequency: Min 3X/week PT Duration: 4 weeks PT Treatment/Interventions: Gait training;Therapeutic activities;Therapeutic exercise PT Plan: See three times a week.  Next treatment add rocker board, standing knee flexion, terminal knee extension, quad stretch and PROM to program.  May do retro massage if pt swelling is stilll significant.  Third treatment add  SLS, lateral step ups; forward step ups and supine terminal knee extension.    Goals Home Exercise Program Pt will Perform Home Exercise Program: Independently PT Short Term Goals Time to Complete Short Term Goals: 2 weeks PT  Short Term Goal 1: Ambulate without an assistive device. PT Short Term Goal 2: ROM 0 to 100 to allow normalized gait and increase comfort of prolong sitting. PT Long Term Goals Time to Complete Long Term Goals: 4 weeks PT Long Term Goal 1: ROM to 115 B to allow pt to squat to pick items off the floor PT Long Term Goal 2: Strength wnl to allow patient to go up and down steps reciprocally Long Term Goal 3: Patient able to walk for an hour without increased discomfort. Long Term Goal 4: Pt to be able to sleep in her bed. PT Long Term Goal 5: Pt to return to work.  Problem List Patient Active Problem List  Diagnoses  . OTHER DYSPHAGIA  . CEREBROVASCULAR ACCIDENT, HX OF  . S/P TKR (total knee replacement)  . Hypertension  . Dyslipidemia  . GERD (gastroesophageal reflux disease)  . UTI (lower urinary tract infection)  . Stiffness of joint, lower leg  . Bilateral leg weakness    PT - End of Session Activity Tolerance: Patient tolerated treatment well General Behavior During Session: [facility] for tasks performed Cognition: [facility] for tasks performed   [name],CINDY 09/10/2011, 10:37 AM  Physician Documentation Your signature is required to indicate approval of the treatment plan as stated above.  Please sign and either send electronically or make a copy of this report for your files and return this physician signed original.   Please mark one 1.__approve of plan  2. ___approve of plan with the following conditions.   ______________________________                                                          _____________________ Physician Signature                                                                                                             Date

## 2011-09-10 NOTE — Patient Instructions (Addendum)
hep

## 2011-09-15 ENCOUNTER — Ambulatory Visit (HOSPITAL_COMMUNITY)
Admission: RE | Admit: 2011-09-15 | Discharge: 2011-09-15 | Disposition: A | Discharge status: Home / Self Care | Payer: BC Managed Care – PPO | Source: Ambulatory Visit | Attending: Orthopedic Surgery | Admitting: Orthopedic Surgery

## 2011-09-15 NOTE — Progress Notes (Signed)
Physical Therapy Treatment Patient Details  Name: [name] MRN: [id number] Date of Birth: 1951-07-07  Today's Date: 09/15/2011 Time: 4098-1191 Time Calculation (min): 67 min Visit#: 2  of 12   Re-eval: 10/10/11 Charges: Therex x 45' Ice x 10'  Subjective: Symptoms/Limitations Symptoms: Pt reports that she is a little sore from walking around [employer] yesterday but not having any real pain. Pain Assessment Currently in Pain?: No/denies Pain Score: 0-No pain   Exercise/Treatments Aerobic Stationary Bike: 6' fulll revolution seat 13 Standing Heel Raises: 10 reps Knee Flexion: 20 reps;Both Functional Squat: 10 reps Other Standing Knee Exercises: toe raises x 10 Seated Long Arc Quad: 10 reps;Both Supine Quad Sets: 10 reps;Limitations Quad Sets Limitations: 5" holds Short Arc Quad Sets: 10 reps Heel Slides: 5 reps;Both Knee Extension: PROM Knee Flexion: PROM Sidelying Hip ABduction:  (Time) Prone  Hamstring Curl:  (Time) Hip Extension:  (Time)   Modalities Modalities: Cryotherapy Cryotherapy Number Minutes Cryotherapy: 10 Minutes Cryotherapy Location: Knee (B) Type of Cryotherapy: Ice pack  Physical Therapy Assessment and Plan PT Assessment and Plan Clinical Impression Statement: Pt able to complete full revolution forward on rec bike this session. Pt completes therex with minimal difficulty. Pt requires VC's and demo for proper form with functional squats. Ice pack applied to B knees at end of session to decrease swelling. PT Plan: Continue per PT POC. Begin quad stretch, SLS, lateral step ups; forward step ups and supine terminal knee extension next session.     Problem List Patient Active Problem List  Diagnoses  . OTHER DYSPHAGIA  . CEREBROVASCULAR ACCIDENT, HX OF  . S/P TKR (total knee replacement)  . Hypertension  . Dyslipidemia  . GERD (gastroesophageal reflux disease)  . UTI (lower urinary tract infection)  . Stiffness of joint, lower leg  .  Bilateral leg weakness    PT - End of Session Activity Tolerance: Patient tolerated treatment well General Behavior During Session: [facility] for tasks performed Cognition: [facility] for tasks performed  Antonieta Iba 09/15/2011, 3:11 PM

## 2011-09-17 ENCOUNTER — Ambulatory Visit (HOSPITAL_COMMUNITY)
Admission: RE | Admit: 2011-09-17 | Discharge: 2011-09-17 | Disposition: A | Discharge status: Home / Self Care | Payer: BC Managed Care – PPO | Source: Ambulatory Visit | Attending: Orthopedic Surgery | Admitting: Orthopedic Surgery

## 2011-09-17 NOTE — Progress Notes (Signed)
Physical Therapy Treatment Patient Details  Name: [name] MRN: [id number] Date of Birth: 02-26-1951  Today's Date: 09/17/2011 Time: 6213-0865 Time Calculation (min): 68 min Visit#: 3  of 12   Re-eval: 10/10/11 Charges:  therex 53', ice 10'    Subjective:  Pt. States she doesn't have any pain.  Doing well.  Went back to MD and he has turned her loose to drive.   Exercise/Treatments Aerobic Stationary Bike: 6' fulll revolution seat 13 Standing Heel Raises: 15 reps Knee Flexion: 20 reps Lateral Step Up: 10 reps;Step Height: 4" Functional Squat: 15 reps Other Standing Knee Exercises: toe raises x 15 Seated Long Arc Quad: 15 reps Supine Quad Sets: 15 reps Short Arc Quad Sets: Both;15 reps;Limitations Straight Leg Raises: 15 reps;Both Knee Extension: PROM Knee Flexion: PROM Sidelying Hip ABduction: 15 reps Prone  Hamstring Curl: 15 reps Hip Extension: 15 reps   Modalities Modalities: Cryotherapy Cryotherapy Number Minutes Cryotherapy: 10 Minutes Cryotherapy Location: Knee (Bilateral) Type of Cryotherapy: Ice pack  Physical Therapy Assessment and Plan PT Assessment and Plan Clinical Impression Statement: Pt. able to complete all exercises with slight difficulty.  Less VC's needed with squats today.  Added lateral step ups and prone quad stretch without difficulty. PT Plan: Continue; add forward step ups and TKE in supine next visit.     Problem List Patient Active Problem List  Diagnoses  . OTHER DYSPHAGIA  . CEREBROVASCULAR ACCIDENT, HX OF  . S/P TKR (total knee replacement)  . Hypertension  . Dyslipidemia  . GERD (gastroesophageal reflux disease)  . UTI (lower urinary tract infection)  . Stiffness of joint, lower leg  . Bilateral leg weakness    PT - End of Session Activity Tolerance: Patient tolerated treatment well General Behavior During Session: [facility] for tasks performed Cognition: [facility] for tasks performed  Lurena Nida 09/17/2011, 6:49  PM

## 2011-09-19 ENCOUNTER — Ambulatory Visit (HOSPITAL_COMMUNITY)
Admission: RE | Admit: 2011-09-19 | Discharge: 2011-09-19 | Disposition: A | Discharge status: Home / Self Care | Payer: BC Managed Care – PPO | Source: Ambulatory Visit | Attending: Physical Therapy | Admitting: Physical Therapy

## 2011-09-19 NOTE — Progress Notes (Signed)
Physical Therapy Treatment Patient Details  Name: [name] MRN: [id number] Date of Birth: 01-29-1951  Today's Date: 09/19/2011 Time: 4098-1191 Time Calculation (min): 57 min Visit#: 4  of 12   Re-eval: 10/10/11  There ex 53  Subjective: Symptoms/Limitations Symptoms: Pt doing well driving Pain Assessment Currently in Pain?: No/denies   Exercise/Treatments Stretches Quad Stretch: 3 reps;30 seconds Aerobic Stationary Bike: 7' seat at 14 Standing Knee Flexion: Both;10 reps Terminal Knee Extension: Both;10 reps Rocker Board: 1 minute SLS: 5x 3 sec max B Other Standing Knee Exercises: chair lunge x 1' x2 B   Supine Quad Sets: 10 reps Knee Flexion: PROM Sidelying  Hip ABduction: 3# Prone   Hamstring Curl Limitations: 3# x 15 Hip Extension: 15 reps Contract/Relax to Increase Flexion: 5  Physical Therapy Assessment and Plan PT Assessment and Plan Clinical Impression Statement: Pt doing well with exercises. PT Plan: Rx to emphasis knee flexion, balance, hip ext/ab and knee strength.     Goals    Problem List Patient Active Problem List  Diagnoses  . OTHER DYSPHAGIA  . CEREBROVASCULAR ACCIDENT, HX OF  . S/P TKR (total knee replacement)  . Hypertension  . Dyslipidemia  . GERD (gastroesophageal reflux disease)  . UTI (lower urinary tract infection)  . Stiffness of joint, lower leg  . Bilateral leg weakness    PT - End of Session Activity Tolerance: Patient tolerated treatment well General Behavior During Session: [facility] for tasks performed  RUSSELL,CINDY 09/19/2011, 4:13 PM

## 2011-09-22 ENCOUNTER — Ambulatory Visit (HOSPITAL_COMMUNITY)
Admission: RE | Admit: 2011-09-22 | Discharge: 2011-09-22 | Disposition: A | Discharge status: Home / Self Care | Payer: BC Managed Care – PPO | Source: Ambulatory Visit | Attending: Orthopedic Surgery | Admitting: Orthopedic Surgery

## 2011-09-22 NOTE — Progress Notes (Signed)
Physical Therapy Treatment Patient Details  Name: [name] MRN: [id number] Date of Birth: [birth year]  Today's Date: 09/22/2011 Time: 2130-8657 Time Calculation (min): 57 min Visit#: 5  of 12   Re-eval: 10/10/11 Charges: Therex x 33' Ice x 10'  Subjective: Symptoms/Limitations Symptoms: Pt reports that she tried to sleep in the bed last night but could only sleep about 30'. Pain Assessment Currently in Pain?: Yes Pain Score:   2 Pain Location: Knee Pain Orientation: Right;Left   Exercise/Treatments Aerobic Stationary Bike: 6' @ seat 13 Standing Knee Flexion: 15 reps Terminal Knee Extension: 15 reps;Theraband Theraband Level (Terminal Knee Extension): Level 4 (Blue) Lateral Step Up: 10 reps;Step Height: 4" Forward Step Up: 10 reps;Step Height: 4" Functional Squat: 15 reps Rocker Board: 2 minutes SLS: R:3" L:5" max of 5 Other Standing Knee Exercises: chair lunge x 1' x2 B   Modalities Modalities: Cryotherapy Cryotherapy Number Minutes Cryotherapy: 10 Minutes Cryotherapy Location: Knee (Bilateral) Type of Cryotherapy: Ice pack  Physical Therapy Assessment and Plan PT Assessment and Plan Clinical Impression Statement: Pt completes therex with proper form after demo. Pt requires multimodal cueing to facilitate proper form with functional squats. Pt reports no increase in pain at end of session.     Problem List Patient Active Problem List  Diagnoses  . OTHER DYSPHAGIA  . CEREBROVASCULAR ACCIDENT, HX OF  . S/P TKR (total knee replacement)  . Hypertension  . Dyslipidemia  . GERD (gastroesophageal reflux disease)  . UTI (lower urinary tract infection)  . Stiffness of joint, lower leg  . Bilateral leg weakness    PT - End of Session Activity Tolerance: Patient tolerated treatment well General Behavior During Session: [facility] for tasks performed Cognition: [facility] for tasks performed  Antonieta Iba 09/22/2011, 5:55 PM

## 2011-09-24 ENCOUNTER — Ambulatory Visit (HOSPITAL_COMMUNITY)
Admission: RE | Admit: 2011-09-24 | Discharge: 2011-09-24 | Disposition: A | Discharge status: Home / Self Care | Payer: BC Managed Care – PPO | Source: Ambulatory Visit | Attending: Internal Medicine | Admitting: Internal Medicine

## 2011-09-24 ENCOUNTER — Ambulatory Visit (HOSPITAL_COMMUNITY): Payer: BC Managed Care – PPO | Admitting: Physical Therapy

## 2011-09-24 NOTE — Progress Notes (Signed)
Physical Therapy Treatment Patient Details  Name: ROXI HLAVATY MRN: [id number] Date of Birth: [birth year]  Today's Date: 09/24/2011 Time: 0850-1002 Time Calculation (min): 72 min Visit#: 6  of 12   Re-eval: 10/10/11 Charges: Therex x 54' Ice x 10'  Subjective: Symptoms/Limitations Symptoms: My knees just feel tight today. Pain Assessment Currently in Pain?: No/denies Pain Score: 0-No pain   Exercise/Treatments Aerobic Stationary Bike: 6' @ seat 13 Standing Knee Flexion: 15 reps Terminal Knee Extension: 15 reps;Theraband Theraband Level (Terminal Knee Extension): Level 4 (Blue) Lateral Step Up: 15 reps;Step Height: 4" Forward Step Up: 15 reps;Step Height: 4" Functional Squat: 15 reps Rocker Board: 2 minutes SLS: R:7" L:4" Other Standing Knee Exercises: chair lunge x 1' x2 B Supine Quad Sets: 10 reps Knee Flexion: PROM Sidelying Hip ABduction: 10 reps Hip ABduction Limitations: 3#   Modalities Modalities: Cryotherapy Cryotherapy Number Minutes Cryotherapy: 10 Minutes Cryotherapy Location: Knee (Bilateral) Type of Cryotherapy: Ice pack  Physical Therapy Assessment and Plan PT Assessment and Plan Clinical Impression Statement: Pt displays increased control with step up with decreased use of hands. Pt requires decreased cueing for proper form with functional squats. Pt reprots no increase in pain at end ofsession. Ice applied to B knee to limit swelling. PT Plan: Continue to progress per PT POC.     Problem List Patient Active Problem List  Diagnoses  . OTHER DYSPHAGIA  . CEREBROVASCULAR ACCIDENT, HX OF  . S/P TKR (total knee replacement)  . Hypertension  . Dyslipidemia  . GERD (gastroesophageal reflux disease)  . UTI (lower urinary tract infection)  . Stiffness of joint, lower leg  . Bilateral leg weakness    PT - End of Session Activity Tolerance: Patient tolerated treatment well General Behavior During Session: [facility] for tasks performed Cognition:  [facility] for tasks performed  Antonieta Iba 09/24/2011, 10:10 AM

## 2011-09-25 ENCOUNTER — Ambulatory Visit (HOSPITAL_COMMUNITY)
Admission: RE | Admit: 2011-09-25 | Discharge: 2011-09-25 | Disposition: A | Discharge status: Home / Self Care | Payer: BC Managed Care – PPO | Source: Ambulatory Visit | Attending: Internal Medicine | Admitting: Internal Medicine

## 2011-09-25 NOTE — Progress Notes (Addendum)
Physical Therapy Treatment Patient Details  Name: [name] MRN: [id number] Date of Birth: 09-Nov-1950  Today's Date: 09/25/2011 Time: 5284-1324 Time Calculation (min): 74 min Visit#: 7  of 12   Re-eval: 10/10/11 Charges: Therex x 54' Ice x 10'  Subjective: Symptoms/Limitations Symptoms: No pain today, just tightness. I always feel tight in the mornings. Pain Assessment Currently in Pain?: No/denies Pain Score: 0-No pain   Exercise/Treatments Aerobic Stationary Bike: 6' @ seat 13 Standing Knee Flexion: 15 reps;Limitations Knee Flexion Limitations: 3# Lateral Step Up: 15 reps;Step Height: 4" Forward Step Up: 15 reps;Step Height: 4" Functional Squat: 15 reps Rocker Board: 2 minutes SLS: R:7" L:5" max of 5 Other Standing Knee Exercises: chair lunge x 1' x2 B Supine Quad Sets: 10 reps Short Arc Quad Sets: 10 reps Short Arc Quad Sets Limitations: 3# Knee Flexion: PROM Sidelying Hip ABduction: 10 reps Hip ABduction Limitations: 3# Prone  Hip Extension: 15 reps  Physical Therapy Assessment and Plan PT Assessment and Plan Clinical Impression Statement: Pt completes therex with minimal difficulty and minimal need for cueing. Pt completes rec bike full revolutions with increased ease. Pt reports no increase in pain at end of session. PT Plan: Continue to progress ROM/strength per PT POC.     Problem List Patient Active Problem List  Diagnoses  . OTHER DYSPHAGIA  . CEREBROVASCULAR ACCIDENT, HX OF  . S/P TKR (total knee replacement)  . Hypertension  . Dyslipidemia  . GERD (gastroesophageal reflux disease)  . UTI (lower urinary tract infection)  . Stiffness of joint, lower leg  . Bilateral leg weakness    PT - End of Session Activity Tolerance: Patient tolerated treatment well General Behavior During Session: [facility] for tasks performed Cognition: [facility] for tasks performed  Antonieta Iba 09/25/2011, 9:05 AM

## 2011-09-29 ENCOUNTER — Ambulatory Visit (HOSPITAL_COMMUNITY): Payer: BC Managed Care – PPO | Admitting: Physical Therapy

## 2011-10-01 ENCOUNTER — Ambulatory Visit (HOSPITAL_COMMUNITY)
Admission: RE | Admit: 2011-10-01 | Discharge: 2011-10-01 | Disposition: A | Discharge status: Home / Self Care | Payer: BC Managed Care – PPO | Source: Ambulatory Visit | Attending: Internal Medicine | Admitting: Internal Medicine

## 2011-10-01 DIAGNOSIS — R29898 Other symptoms and signs involving the musculoskeletal system: Secondary | ICD-10-CM

## 2011-10-01 DIAGNOSIS — M25669 Stiffness of unspecified knee, not elsewhere classified: Secondary | ICD-10-CM

## 2011-10-01 NOTE — Progress Notes (Signed)
Physical Therapy Treatment Patient Details  Name: [name] MRN: [id number] Date of Birth: Feb 02, 1951  Today's Date: 10/01/2011 Time: 7829-5621 Time Calculation (min): 80 min Visit#: 8  of 12   Re-eval: 10/10/11 Charges: Therex x 60' Ice x 10'  Subjective: Symptoms/Limitations Symptoms: My feet and legs are so swollen from being up walking and standing over the holiday. Pt reports taht she has not had time to ice her knees. Pain Assessment Currently in Pain?: No/denies Pain Score: 0-No pain   Exercise/Treatments Aerobic Stationary Bike: 6'@ 2.0 seat 13 Standing Knee Flexion: 15 reps;Limitations Knee Flexion Limitations: 3#  TKF with 4" step Lateral Step Up: 15 reps;Step Height: 4" Forward Step Up: 15 reps;Step Height: 4" Step Down: 15 reps;Step Height: 4" Functional Squat: 20 reps Rocker Board: 2 minutes SLS: R:7" L:5" Max of 5 Other Standing Knee Exercises: chair lunge x 1' x2 B Supine Quad Sets: 10 reps Quad Sets Limitations: 5" holds Short Arc [employer]: 15 reps Short Arc Quad Sets Limitations: 3#  5" each Knee Flexion: PROM Sidelying Hip ABduction: 15 reps Hip ABduction Limitations: 3#   Modalities Modalities: Cryotherapy Cryotherapy Number Minutes Cryotherapy: 10 Minutes Cryotherapy Location: Knee (Bilateral) Type of Cryotherapy: Ice pack  Physical Therapy Assessment and Plan PT Assessment and Plan Clinical Impression Statement: Pt educated on importance of elevating LE and applying ice to B knees. Pt continues to display gains in ROM. Pt with significant swelling in BLE. Ice applied to B knees with LE elevated . PT Plan: Assess swelling in LE next tx and begin retrograde massage as needed.     Problem List Patient Active Problem List  Diagnoses  . OTHER DYSPHAGIA  . CEREBROVASCULAR ACCIDENT, HX OF  . S/P TKR (total knee replacement)  . Hypertension  . Dyslipidemia  . GERD (gastroesophageal reflux disease)  . UTI (lower urinary tract  infection)  . Stiffness of joint, lower leg  . Bilateral leg weakness    PT - End of Session Activity Tolerance: Patient tolerated treatment well General Behavior During Session: [facility] for tasks performed Cognition: [facility] for tasks performed  Antonieta Iba 10/01/2011, 12:20 PM

## 2011-10-03 ENCOUNTER — Ambulatory Visit (HOSPITAL_COMMUNITY)
Admission: RE | Admit: 2011-10-03 | Discharge: 2011-10-03 | Disposition: A | Discharge status: Home / Self Care | Payer: BC Managed Care – PPO | Source: Ambulatory Visit | Attending: Internal Medicine | Admitting: Internal Medicine

## 2011-10-03 DIAGNOSIS — R29898 Other symptoms and signs involving the musculoskeletal system: Secondary | ICD-10-CM

## 2011-10-03 DIAGNOSIS — M25669 Stiffness of unspecified knee, not elsewhere classified: Secondary | ICD-10-CM

## 2011-10-03 NOTE — Patient Instructions (Addendum)
HEP

## 2011-10-03 NOTE — Progress Notes (Signed)
Physical Therapy Evaluation  Patient Details  Name: [name] MRN: [id number] Date of Birth: 03/06/1951  Today's Date: 10/03/2011 Time: 4098-1191 Time Calculation (min): 45 min Visit#: 9  of 12   Re-eval: 10/17/11   Past Medical History: No past medical history on file. Past Surgical History: No past surgical history on file.  Subjective Symptoms/Limitations Symptoms: I return to work next Wednesday, don't want to go back but its not because of the pain feel like I can do it all no worries about work.  Pain free today. Pain Assessment Currently in Pain?: No/denies   Assessment ( last re-eval) RLE AROM (degrees) Right Knee Extension 0-130: 0 (0) Right Knee Flexion 0-140: 101 (95) RLE PROM (degrees) Right Knee Extension 0-130: 0  Right Knee Flexion 0-140: 105  RLE Strength Right Hip Flexion: 5/5  (5) Right Hip Extension: 4/5 (3-) Right Hip ABduction: 3+/5 (3+) Right Hip ADduction: 5/5 (5) Right Knee Flexion: 5/5 (4-) Right Knee Extension: 5/5 (4) Right Ankle Dorsiflexion: 5/5 LLE AROM (degrees) Left Knee Extension 0-130: 0  (3) Left Knee Flexion 0-140: 102  (88) LLE PROM (degrees) Left Knee Extension 0-130: 0 Left Knee Flexion 0-140: 107 LLE Strength Left Hip Flexion: 5/5 (5) Left Hip Extension: 4/5 (3-) Left Hip ABduction: 3+/5 (3) Left Hip ADduction: 5/5 (5) Left Knee Flexion: 5/5 (5) Left Knee Extension: 5/5 (4-) Left Ankle Dorsiflexion: 5/5  Exercise/Treatments  Aerobic Stationary Bike: 8' @ 3.0 Standing Lateral Step Up:  (stop) Forward Step Up:  (stop) Step Down:  (stop) Functional Squat:  (stop) Rocker Board:  (stop) Other Standing Knee Exercises: stairs x 1 flight. Supine Heel Slides: Both;10 reps Knee Flexion: PROM;Both Sidelying Hip ABduction: 15 reps Prone  Hip Extension: Strengthening;Right;Left;15 reps    Physical Therapy Assessment and Plan PT Assessment and Plan Clinical Impression Statement: Pt improved with ROM and strength.   PT to work on improving hip abduction and extension strength as well as ROM  Rehab Potential: Good PT Frequency: Min 2X/week PT Duration:  (2 wks) PT Treatment/Interventions: Gait training PT Plan: Work hip abductors, extensors , gt,(gait trainer) and ROM, lunges on chair, quad stretch, prone contract relax to increase flexion,     Goals Home Exercise Program PT Goal: Perform Home Exercise Program - Progress: Met PT Short Term Goals PT Short Term Goal 1 - Progress: Met (Does take SPC with but ambulates static surface w.o) PT Short Term Goal 2 - Progress: Met PT Long Term Goals PT Long Term Goal 1 - Progress: Progressing toward goal PT Long Term Goal 2 - Progress: Met (able to go up and down reciprocally but very slowly.) Long Term Goal 3 Progress: Met Long Term Goal 4 Progress: Not met Long Term Goal 5 Progress: Progressing toward goal Additional PT Long Term Goals?: Yes PT Long Term Goal 6: New as of 10/03/11- normalize heel to toe gait pattern.-2 weeks  Problem List Patient Active Problem List  Diagnoses  . OTHER DYSPHAGIA  . CEREBROVASCULAR ACCIDENT, HX OF  . S/P TKR (total knee replacement)  . Hypertension  . Dyslipidemia  . GERD (gastroesophageal reflux disease)  . UTI (lower urinary tract infection)  . Stiffness of joint, lower leg  . Bilateral leg weakness    PT - End of Session Activity Tolerance: Patient tolerated treatment well General Behavior During Session: [facility] for tasks performed Cognition: [facility] for tasks performed   Lyanne Kates,CINDY 10/03/2011, 3:28 PM  Physician Documentation Your signature is required to indicate approval of the treatment plan as  stated above.  Please sign and either send electronically or make a copy of this report for your files and return this physician signed original.   Please mark one 1.__approve of plan  2. ___approve of plan with the following conditions.   ______________________________                                                           _____________________ Physician Signature                                                                                                             Date

## 2011-10-09 ENCOUNTER — Ambulatory Visit (HOSPITAL_COMMUNITY): Payer: BC Managed Care – PPO

## 2011-10-10 ENCOUNTER — Ambulatory Visit (HOSPITAL_COMMUNITY)
Admission: RE | Admit: 2011-10-10 | Discharge: 2011-10-10 | Disposition: A | Discharge status: Home / Self Care | Payer: BC Managed Care – PPO | Source: Ambulatory Visit | Attending: Orthopedic Surgery | Admitting: Orthopedic Surgery

## 2011-10-10 DIAGNOSIS — I10 Essential (primary) hypertension: Secondary | ICD-10-CM | POA: Insufficient documentation

## 2011-10-10 DIAGNOSIS — R262 Difficulty in walking, not elsewhere classified: Secondary | ICD-10-CM | POA: Insufficient documentation

## 2011-10-10 DIAGNOSIS — M25669 Stiffness of unspecified knee, not elsewhere classified: Secondary | ICD-10-CM | POA: Insufficient documentation

## 2011-10-10 DIAGNOSIS — M25569 Pain in unspecified knee: Secondary | ICD-10-CM | POA: Insufficient documentation

## 2011-10-10 DIAGNOSIS — R29898 Other symptoms and signs involving the musculoskeletal system: Secondary | ICD-10-CM

## 2011-10-10 DIAGNOSIS — M6281 Muscle weakness (generalized): Secondary | ICD-10-CM | POA: Insufficient documentation

## 2011-10-10 DIAGNOSIS — IMO0001 Reserved for inherently not codable concepts without codable children: Secondary | ICD-10-CM | POA: Insufficient documentation

## 2011-10-10 NOTE — Progress Notes (Signed)
Physical Therapy Treatment Patient Details  Name: XITLALIC MASLIN MRN: [id number] Date of Birth: 17-Dec-1950  Today's Date: 10/10/2011 Time: 8295-6213 Time Calculation (min): 84 min Charges: 10 ice, 10 manual, 10 neuro re-ed, 54 TE.  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   Pain Assessment Currently in Pain?: No/denies Pain Score: 0-No pain Pain Location: Knee Pain Orientation: Right;Left Pain Type: Surgical pain  Exercise/Treatments Aerobic Stationary Bike: 8' @ 3.0 Standing Knee Flexion: 15 reps;Limitations Knee Flexion Limitations: 3#  TKF with 4" step Lateral Step Up: Hand Hold: 2;Step Height: 6";Limitations Lateral Step Up Limitations: 12 reps Forward Step Up: Hand Hold: 2;Step Height: 6";Limitations Forward Step Up Limitations: 12 reps Step Down: Hand Hold: 2;Step Height: 6";Limitations Step Down Limitations: 12 reps Functional Squat: 20 reps Rocker Board: 2 minutes;Limitations Rocker Board Limitations: A/P and R/L SLS: R:7" L:5" Max of 3 Other Standing Knee Exercises: chair lunge x 1' x2 B Other Standing Knee Exercises: hip abduction 3#15, bil.  Hip ext 15 times, 3# bil.   Supine Quad Sets: 15 reps Quad Sets Limitations: 5" holds. Short Arc [employer]: 15 reps Short Arc [employer] Limitations: 3#  5" each Heel Slides: Both;10 reps Knee Flexion:  (C/R done in prone today) Sidelying Hip ABduction: 15 reps Hip ABduction Limitations: 3# Prone  Hamstring Curl: 15 reps;Limitations Hamstring Curl Limitations: 3# bil.   Hip Extension: Strengthening;Right;Left;15 reps;Limitations Hip Extension Limitations: 3# Contract/Relax to Increase Flexion: 3x 30 sec with 6 second contract bil.    Modalities: Ice x 10 mins bil knees in prone.    Physical Therapy Assessment and Plan  Clinical Impression Statement: Given new HEP exercises to focus on weak areas.   Instructed patient to perform them alternating days with her old exercise program which are all still good exercises for her.    C/R in prone for flex performed instead of heel slides supine for PROM today.  Tighter on right sides still compared to left.   PT Plan: new HEP given to focus on new areas of strength and balance.  Continue with current POC, assess tolerance to new ther ex next treatment session.      Problem List Patient Active Problem List  Diagnoses  . OTHER DYSPHAGIA  . CEREBROVASCULAR ACCIDENT, HX OF  . S/P TKR (total knee replacement)  . Hypertension  . Dyslipidemia  . GERD (gastroesophageal reflux disease)  . UTI (lower urinary tract infection)  . Stiffness of joint, lower leg  . Bilateral leg weakness    PT Plan of Care PT Home Exercise Plan: see scanned report.   Consulted and Agree with Plan of Care: Patient  Rollene Rotunda. Rikki Trosper, PT, DPT  10/10/2011, 7:15 PM

## 2011-10-13 ENCOUNTER — Ambulatory Visit (HOSPITAL_COMMUNITY)
Admission: RE | Admit: 2011-10-13 | Discharge: 2011-10-13 | Disposition: A | Discharge status: Home / Self Care | Payer: BC Managed Care – PPO | Source: Ambulatory Visit | Attending: Internal Medicine | Admitting: Internal Medicine

## 2011-10-13 DIAGNOSIS — R29898 Other symptoms and signs involving the musculoskeletal system: Secondary | ICD-10-CM

## 2011-10-13 DIAGNOSIS — M25669 Stiffness of unspecified knee, not elsewhere classified: Secondary | ICD-10-CM

## 2011-10-13 NOTE — Progress Notes (Signed)
Physical Therapy Treatment Patient Details  Name: [name] MRN: [id number] Date of Birth: [birth year]  Today's Date: 10/13/2011 Time: 0802-0850 Time Calculation (min): 48 min Visit#: 11  of 16   Re-eval: 10/16/11    Subjective: Symptoms/Limitations Symptoms: Just stiffness.  Work is going fine. Pain Assessment Currently in Pain?: No/denies   Exercise/Treatments     Stretches Quad Stretch: 3 reps;30 seconds Aerobic Stationary Bike: 8' @ 3.0 seat 11 Standing Forward Lunges:  (on chair 2 x 1')   Prone  Prone Knee Hang: Other (comment) Straight Leg Raises:  (PROM 3x 30") Other Prone Exercises: contract relax to increase flex Other Prone Exercises: terminal flex x 10  Manual Therapy Manual Therapy: Edema management Edema Management: retromassage B   Physical Therapy Assessment and Plan PT Assessment and Plan Rehab Potential: Good PT Plan: Pt needs to work on ext.  Hip abduction and extension only as in last reassessment.     Goals    Problem List Patient Active Problem List  Diagnoses  . OTHER DYSPHAGIA  . CEREBROVASCULAR ACCIDENT, HX OF  . S/P TKR (total knee replacement)  . Hypertension  . Dyslipidemia  . GERD (gastroesophageal reflux disease)  . UTI (lower urinary tract infection)  . Stiffness of joint, lower leg  . Bilateral leg weakness    PT - End of Session Activity Tolerance: Patient tolerated treatment well General Behavior During Session: [facility] for tasks performed Cognition: [facility] for tasks performed PT Plan of Care Consulted and Agree with Plan of Care: Patient  RUSSELL,CINDY 10/13/2011, 8:51 AM

## 2011-10-16 ENCOUNTER — Ambulatory Visit (HOSPITAL_COMMUNITY)
Admission: RE | Admit: 2011-10-16 | Discharge: 2011-10-16 | Disposition: A | Discharge status: Home / Self Care | Payer: BC Managed Care – PPO | Source: Ambulatory Visit | Attending: Internal Medicine | Admitting: Internal Medicine

## 2011-10-16 ENCOUNTER — Ambulatory Visit (HOSPITAL_COMMUNITY): Payer: BC Managed Care – PPO

## 2011-10-16 DIAGNOSIS — R29898 Other symptoms and signs involving the musculoskeletal system: Secondary | ICD-10-CM

## 2011-10-16 DIAGNOSIS — M25669 Stiffness of unspecified knee, not elsewhere classified: Secondary | ICD-10-CM

## 2011-10-16 NOTE — Progress Notes (Signed)
Physical Therapy Treatment Patient Details  Name: [name] MRN: [id number] Date of Birth: [birth year]  Today's Date: 10/16/2011 Time: 4403-4742 Time Calculation (min): 68 min Visit#: 13  of 16   Re-eval: 10/16/11  Charge: therex 50 min Manual 10 min  Subjective: Symptoms/Limitations Symptoms: Pt stated stiffness at work following sitting for long periods of time.   Pain Assessment Currently in Pain?: No/denies  Objective:   Exercise/Treatments Aerobic Stationary Bike: 8' @ 3.0 seat 11 Standing Lateral Step Up: Both;Step Height: 6";Limitations;Hand Hold: 1 Lateral Step Up Limitations: 12 reps Forward Step Up: Both;Step Height: 6";Limitations;Hand Hold: 1 Forward Step Up Limitations: 12 reps Step Down: Both;Step Height: 6";Limitations;Hand Hold: 1 Step Down Limitations: 12 reps Wall Squat: 10 reps;5 seconds SLS: R 4", L 3" max of 5 Other Standing Knee Exercises: chair lunge x 1' x2 B L with chair, R with 8+4in steps Other Standing Knee Exercises: hip abduction 3#15, bil.  Hip ext 15 times, 3# bil; hip ext with blue tband 15 reps Prone  Hip Extension: Strengthening;Right;Left;15 reps;Limitations Hip Extension Limitations: 3# Contract/Relax to Increase Flexion: 3x 30 sec with 6 second contract bil.   Straight Leg Raises: Both;Limitations Straight Leg Raises Limitations: 12 reps with 3#   Manual Therapy Manual Therapy: Edema management Edema Management: retromassage BLE 5 min each  Physical Therapy Assessment and Plan PT Assessment and Plan Clinical Impression Statement: Pt tolerated well towards total session, able to complete all therex without difficulty just ROM remains an impairment.  Min cueing to bend knee/hip with stairs to reduce circumduction.  Noted increased edema B LE.  Session concentration on strengthening hip extensors and abductors bilator, increase flexion B knees and edema reduction.  Pt stated she will return to second job starting next week, pt  recommended to wear compression hose to help with the edema control.   PT Plan: Reassess next session    Goals    Problem List Patient Active Problem List  Diagnoses  . OTHER DYSPHAGIA  . CEREBROVASCULAR ACCIDENT, HX OF  . S/P TKR (total knee replacement)  . Hypertension  . Dyslipidemia  . GERD (gastroesophageal reflux disease)  . UTI (lower urinary tract infection)  . Stiffness of joint, lower leg  . Bilateral leg weakness    PT - End of Session Activity Tolerance: Patient tolerated treatment well General Behavior During Session: [facility] for tasks performed Cognition: [facility] for tasks performed  Juel Burrow 10/16/2011, 6:07 PM

## 2011-10-20 ENCOUNTER — Other Ambulatory Visit: Payer: Self-pay | Admitting: Gastroenterology

## 2011-10-20 ENCOUNTER — Ambulatory Visit (HOSPITAL_COMMUNITY): Payer: BC Managed Care – PPO | Admitting: Physical Therapy

## 2011-10-21 ENCOUNTER — Ambulatory Visit (HOSPITAL_COMMUNITY)
Admission: RE | Admit: 2011-10-21 | Discharge: 2011-10-21 | Disposition: A | Discharge status: Home / Self Care | Payer: BC Managed Care – PPO | Source: Ambulatory Visit | Attending: Internal Medicine | Admitting: Internal Medicine

## 2011-10-21 DIAGNOSIS — R29898 Other symptoms and signs involving the musculoskeletal system: Secondary | ICD-10-CM

## 2011-10-21 DIAGNOSIS — M25669 Stiffness of unspecified knee, not elsewhere classified: Secondary | ICD-10-CM

## 2011-10-21 NOTE — Progress Notes (Signed)
Physical Therapy Re-Evaluation- MD note  Patient Details  Name: [name] MRN: [id number] Date of Birth: January 12, 1951  Today's Date: 10/21/2011 Time: 6578-4696 Time Calculation (min): 71 min Charges: 1 re-eval, 1 MMT, 1 ROM, 10 ice, 40 TE Visit#: 14  of 19   Re-eval: 11/06/11    Past Medical History: No past medical history on file. Past Surgical History: No past surgical history on file.  Subjective Symptoms/Limitations Symptoms: sore  in calves with increased swelling bil secondary to worked her first night shift at [employer] long can you sit comfortably?: full day at work How long can you stand comfortably?: 30 mins How long can you walk comfortably?: 30-40 mins better with walking than standing still Repetition: Decreases Symptoms Special Tests: The patient is still sleeping in her reliner.  She also after working her regular job and then a 4.5 hour shift at [employer] last night has a lot of bil leg edema.  Suggested getting some compressing hose to help with edema management during the day.   Pain Assessment Currently in Pain?: No/denies Pain Score: 0-No pain Pain Location: Knee Pain Orientation: Right;Left  Assessment: last filed value ( ) RLE AROM (degrees) Right Knee Extension 0-130: 0 (0) Right Knee Flexion 0-140: 100 (101) RLE PROM (degrees) Right Knee Extension 0-130: 0 (0) Right Knee Flexion 0-140: 106 (105) RLE Strength Right Hip Extension: 5/5 (4/5) Right Hip ABduction: 5/5 (3+/5) LLE AROM (degrees) Left Knee Extension 0-130: 0 (0) Left Knee Flexion 0-140: 100 (102) LLE PROM (degrees) Left Knee Extension 0-130: 0 (0) Left Knee Flexion 0-140: 106 (107) LLE Strength Left Hip Extension: 5/5 (4/5) Left Hip ABduction: 5/5 (3+/5)  Exercise/Treatments Stretches Passive Hamstring Stretch: 2 reps;30 seconds;Limitations Passive Hamstring Stretch Limitations: bil Gastroc Stretch: 2 reps;30 seconds Aerobic Stationary Bike: 9' @ 1.0 seat 12 Standing Heel  Raises: 20 reps;Limitations Heel Raises Limitations: toe raises x 20 reps Knee Flexion: 15 reps;Limitations Knee Flexion Limitations: 3# Lateral Step Up: Both;Step Height: 6";Limitations;Hand Hold: 1 Lateral Step Up Limitations: 12 reps Forward Step Up: Both;Step Height: 6";Limitations;Hand Hold: 1 Forward Step Up Limitations: 12 reps Step Down: Both;Step Height: 6";Limitations;Hand Hold: 1 Step Down Limitations: 12 reps Functional Squat: 20 reps Supine Short Arc Quad Sets: 15 reps Short Arc Quad Sets Limitations: 3#, 5" each Sidelying Hip ABduction: 10 reps (decreased secondary to pain behind the knee with these.  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  We discussed edema management strategies including  compression hose thigh high and  getting up at her desk job every hour to walk around.  She also is breaking in some new and more supportive tennis shoes.  Despite increased soreness and swelling today, she has not lost any motion in bil. knees.  Her strength is now [facility] throughtout bil legs.  She would like to continue thrrough the end of the month to finish out her therapy and work on trying to get to at least 115 degrees of flex bil and practice more gait training.  She has met  3/3 STGs and  3/6 LTGs.   Rehab Potential: Good PT Frequency: Min 2X/week PT Duration:  (until the end of the month) PT Treatment/Interventions: Gait  training;Stair training;Functional mobility training;Therapeutic activities;Therapeutic exercise;Balance training;Neuromuscular re-education;Patient/family education PT Plan: continue until the end of the month working on indoor and outdoor gait training, squatting  to pick up things from the floor, PROM for flexion.      Goals Home Exercise Program PT Goal: Perform Home Exercise Program - Progress: Met PT Short Term Goals Time to Complete Short Term Goals: 2 weeks PT Short Term Goal 1: Ambulate without an assistive device PT Short Term Goal 1 - Progress: Met PT Short Term Goal 2: ROM 0 to 100 to allow normalized gait and increase comfort of prolong sitting PT Short Term Goal 2 - Progress: Met PT Long Term Goals Time to Complete Long Term Goals: 4 weeks PT Long Term Goal 1: ROM to 115 B to allow pt to squat to pick items off the floor  PT Long Term Goal 1 - Progress: Not met PT Long Term Goal 2: Strength wnl to allow patient to go up and down steps reciprocally  PT Long Term Goal 2 - Progress: Met Long Term Goal 3: Patient able to walk for an hour without increased discomfort Long Term Goal 3 Progress: Met Long Term Goal 4: Pt to be able to sleep in her bed Long Term Goal 4 Progress: Not met PT Long Term Goal 5: Pt to return to work Long Term Goal 5 Progress: Met PT Long Term Goal 6: New as of 10/03/11- normalize heel to toe gait pattern.-2 weeks  Long Term Goal 6 Progress: Progressing toward goal  Problem List Patient Active Problem List  Diagnoses  . OTHER DYSPHAGIA  . CEREBROVASCULAR ACCIDENT, HX OF  . S/P TKR (total knee replacement)  . Hypertension  . Dyslipidemia  . GERD (gastroesophageal reflux disease)  . UTI (lower urinary tract infection)  . Stiffness of joint, lower leg  . Bilateral leg weakness    PT - End of Session Activity Tolerance: Patient tolerated treatment well General Behavior During Session: [facility] for tasks performed Cognition: [facility] for tasks performed PT Plan of Care PT Home Exercise Plan: continue with current HEP Consulted and Agree with Plan of Care: Patient   Rollene Rotunda. Patty Leitzke, PT, DPT [phone] 10/21/2011, 6:18 PM  Physician Documentation Your signature is required to indicate approval of the  treatment plan as stated above.  Please sign and either send electronically or make a copy of this report for your files and return this physician signed original.   Please mark one 1.__approve of plan  2. ___approve of plan with the following conditions.   ______________________________                                                          _____________________ Physician Signature  Date  

## 2011-10-23 ENCOUNTER — Ambulatory Visit (HOSPITAL_COMMUNITY)
Admission: RE | Admit: 2011-10-23 | Discharge: 2011-10-23 | Disposition: A | Discharge status: Home / Self Care | Payer: BC Managed Care – PPO | Source: Ambulatory Visit | Attending: Internal Medicine | Admitting: Internal Medicine

## 2011-10-23 DIAGNOSIS — R29898 Other symptoms and signs involving the musculoskeletal system: Secondary | ICD-10-CM

## 2011-10-23 DIAGNOSIS — M25669 Stiffness of unspecified knee, not elsewhere classified: Secondary | ICD-10-CM

## 2011-10-23 NOTE — Progress Notes (Signed)
Physical Therapy Treatment Patient Details  Name: [name] MRN: [id number] Date of Birth: 11-07-1950  Today's Date: 10/23/2011 Time: 6578-4696 Time Calculation (min): 42 min Visit#: 15  of 19   Re-eval: 11/06/11  Charge: therex 42 min  Subjective: Symptoms/Limitations Symptoms: Just sore hamstrings, no real pain B knees. Pain Assessment Currently in Pain?: No/denies  Objective:   Exercise/Treatments Stretches Active Hamstring Stretch: 3 reps;30 seconds;Limitations Active Hamstring Stretch Limitations: with rope Aerobic Stationary Bike: 10' @ 3.0 seat 12 Standing Other Standing Knee Exercises: chair lunge x 1' x2 B L with chair, R with 8+4in steps Supine Heel Slides: Both;15 reps;Limitations Heel Slides Limitations: prior PROM Knee Flexion: 3 sets;Limitations Knee Flexion Limitations: 3x 30"  Physical Therapy Assessment and Plan PT Assessment and Plan Clinical Impression Statement: Demonstration/vc-ing for lifting 8# box today for proper body mechanics required.  Pt continues with impaired B knee flexion, session concentration on ROM. PT Plan: Continue with current POC, proper squatting for lifting objects off floor, gait training on indoor and outdoor environments, PROM for flexion.    Goals    Problem List Patient Active Problem List  Diagnoses  . OTHER DYSPHAGIA  . CEREBROVASCULAR ACCIDENT, HX OF  . S/P TKR (total knee replacement)  . Hypertension  . Dyslipidemia  . GERD (gastroesophageal reflux disease)  . UTI (lower urinary tract infection)  . Stiffness of joint, lower leg  . Bilateral leg weakness    PT - End of Session Activity Tolerance: Patient tolerated treatment well General Behavior During Session: [facility] for tasks performed Cognition: [facility] for tasks performed  Juel Burrow 10/23/2011, 6:54 PM

## 2011-10-27 ENCOUNTER — Ambulatory Visit (HOSPITAL_COMMUNITY)
Admission: RE | Admit: 2011-10-27 | Discharge: 2011-10-27 | Disposition: A | Discharge status: Home / Self Care | Payer: BC Managed Care – PPO | Source: Ambulatory Visit | Attending: Internal Medicine | Admitting: Internal Medicine

## 2011-10-27 DIAGNOSIS — R29898 Other symptoms and signs involving the musculoskeletal system: Secondary | ICD-10-CM

## 2011-10-27 DIAGNOSIS — M25669 Stiffness of unspecified knee, not elsewhere classified: Secondary | ICD-10-CM

## 2011-10-27 NOTE — Progress Notes (Signed)
Physical Therapy Treatment Patient Details  Name: [name] MRN: [id number] Date of Birth: 07/09/1951  Today's Date: 10/27/2011 Time: 9147-8295 Time Calculation (min): 55 min Charges: 10 mins ice, 10 TA, 8 manual, 27 TE Visit#: 16  of 19   Re-eval: 11/06/11    Subjective: Symptoms/Limitations Symptoms: Friday and saturday at [facility] were rough.   Pain Assessment Currently in Pain?: No/denies Pain Score: 0-No pain Pain Location: Knee Pain Orientation: Right;Left  Exercise/Treatments Mobility/Balance  Ambulation/Gait Stairs Assistance: 6: Modified independent (Device/Increase time) Stair Management Technique: One rail Right;Alternating pattern Number of Stairs: 11  (x2) Height of Stairs: 6     Aerobic Stationary Bike: 10' @ 3.0 seat 12 Machines for Strengthening [employer]ex Knee Extension: 4 PL 1 x 10 start position 8 [employer]ex Knee Flexion: 4.5 PL 1 x 10 start position 8 [employer]ex Leg Press: 3 PL x 20 single leg bil.   Standing Wall Squat: 15 reps Other Standing Knee Exercises: stairs in stairwell 2 RT 1 rail reciprocal.   Prone  Straight Leg Raises: Both;15 reps Straight Leg Raises Limitations: 4 # Other Prone Exercises: PROM flex 3 x 30" bil.    Modalities Modalities: Cryotherapy Cryotherapy Number Minutes Cryotherapy: 10 Minutes Cryotherapy Location: Knee (bil) Type of Cryotherapy: Ice pack  Physical Therapy Assessment and Plan  Clinical Impression Statement: Patient tolerated treatment well reported while practicing stairs that she did not go into her basement because the stairs "scare her".  Tolerated machine exercies well.   PT Plan: Continue to practice stairs in hospital stairwell, functional activities and cybex machines.       Problem List Patient Active Problem List  Diagnoses  . OTHER DYSPHAGIA  . CEREBROVASCULAR ACCIDENT, HX OF  . S/P TKR (total knee replacement)  . Hypertension  . Dyslipidemia  . GERD (gastroesophageal reflux disease)  . UTI  (lower urinary tract infection)  . Stiffness of joint, lower leg  . Bilateral leg weakness    PT Plan of Care PT Home Exercise Plan: Try gym machines outside of therapy and practice stairs at work once a day being sure to keep toes pointed forwards.  Consulted and Agree with Plan of Care: Patient  Rollene Rotunda. Kylyn Mcdade, PT, DPT  10/27/2011, 12:37 PM

## 2011-10-30 ENCOUNTER — Inpatient Hospital Stay (HOSPITAL_COMMUNITY)
Admission: RE | Admit: 2011-10-30 | Discharge: 2011-10-30 | Payer: BC Managed Care – PPO | Source: Ambulatory Visit | Attending: Orthopedic Surgery | Admitting: Orthopedic Surgery

## 2011-10-30 DIAGNOSIS — R29898 Other symptoms and signs involving the musculoskeletal system: Secondary | ICD-10-CM

## 2011-10-30 DIAGNOSIS — M25669 Stiffness of unspecified knee, not elsewhere classified: Secondary | ICD-10-CM

## 2011-10-30 NOTE — Progress Notes (Signed)
Physical Therapy Treatment Patient Details  Name: [name] MRN: [id number] Date of Birth: 21-Mar-1951  Today's Date: 10/30/2011 Time: 4098-1191 Time Calculation (min): 57 min Visit#: 17  of 19   Re-eval: 11/06/11  Charge: therex 49 min  Subjective: Symptoms/Limitations Symptoms: No pain today, feeling good. Pain Assessment Currently in Pain?: No/denies  Objective:   Exercise/Treatments Mobility/Balance  Ambulation/Gait Stairs: Yes Stairs Assistance: 6: Modified independent (Device/Increase time) Stair Management Technique: One rail Right;Alternating pattern Number of Stairs: 22  Height of Stairs: 7      Stretches Active Hamstring Stretch: 3 reps;30 seconds;Limitations Active Hamstring Stretch Limitations: with rope Aerobic Stationary Bike: 8' @ 3.0 seat 12 Machines for Strengthening [employer]ex Knee Extension: 4 PL 15x start position 8 [employer]ex Knee Flexion: 5 PL 15 reps start position 8 [employer]ex Leg Press: 3 PL x 20 single leg bil.   Standing Functional Squat: 10 reps;Limitations Functional Squat Limitations: proper liftings with 10# in white box Stairs: 2 Flights 1 hand rail reciprocal Other Standing Knee Exercises: chair lunge 3x 1' B  Prone  Contract/Relax to Increase Flexion: 3x 30 sec with 6 second contract bil.        Physical Therapy Assessment and Plan PT Assessment and Plan Clinical Impression Statement: Pt tolerated treatment well with B knee ROM improving, pt able to do lunge stretch on chair with both knees today.  Able to increase weight on cybex hamstring machine without difficulty. PT Plan: Reassess in 2 more sessions.    Goals    Problem List Patient Active Problem List  Diagnoses  . OTHER DYSPHAGIA  . CEREBROVASCULAR ACCIDENT, HX OF  . S/P TKR (total knee replacement)  . Hypertension  . Dyslipidemia  . GERD (gastroesophageal reflux disease)  . UTI (lower urinary tract infection)  . Stiffness of joint, lower leg  . Bilateral leg weakness      PT - End of Session Activity Tolerance: Patient tolerated treatment well General Behavior During Session: [facility] for tasks performed Cognition: [facility] for tasks performed  Juel Burrow 10/30/2011, 6:31 PM

## 2011-11-03 ENCOUNTER — Ambulatory Visit (HOSPITAL_COMMUNITY)
Admission: RE | Admit: 2011-11-03 | Discharge: 2011-11-03 | Disposition: A | Discharge status: Home / Self Care | Payer: BC Managed Care – PPO | Source: Ambulatory Visit | Attending: Internal Medicine | Admitting: Internal Medicine

## 2011-11-03 NOTE — Progress Notes (Signed)
Physical Therapy Treatment Patient Details  Name: [name] MRN: [id number] Date of Birth: May 06, 1951  Today's Date: 11/03/2011 Time: 0865-7846 Time Calculation (min): 50 min Visit#: 18  of 19   Re-eval: 11/06/11 Charges: Therex x 40'  Subjective: Symptoms/Limitations Symptoms: Pt reports that both of her calves hurt on the lateral side after standing and working all day. She rates this pain at a 2 or 3/10. She is not currently having any pain. Pain Assessment Currently in Pain?: No/denies Pain Score: 0-No pain  Exercise/Treatments  Aerobic Stationary Bike: 6' @ 3.0 seat 12 Machines for Strengthening Cybex Knee Extension: 4 PL 20x  Cybex Knee Flexion: 5 PL 20 reps Cybex Leg Press: 3.5 PL x 20 single leg bil.   Standing Functional Squat: 10 reps;Limitations Functional Squat Limitations: proper liftings with 10# in white box Stairs: 2 Flights 1 hand rail reciprocal Other Standing Knee Exercises: chair lunge 3x 1' B  Supine Knee Flexion: PROM Knee Flexion Limitations: 3x 30"  Physical Therapy Assessment and Plan PT Assessment and Plan Clinical Impression Statement: Pt continues to display increased ROM/strength. Pitting edema noted in B LE. Pt educated on importance of compression and advised to see her primary doctor if it does not subside. Pt reports no icrease in pain at end of session. PT Plan: Reassess next session.     Problem List Patient Active Problem List  Diagnoses  . OTHER DYSPHAGIA  . CEREBROVASCULAR ACCIDENT, HX OF  . S/P TKR (total knee replacement)  . Hypertension  . Dyslipidemia  . GERD (gastroesophageal reflux disease)  . UTI (lower urinary tract infection)  . Stiffness of joint, lower leg  . Bilateral leg weakness    PT - End of Session Activity Tolerance: Patient tolerated treatment well General Behavior During Session: [facility] for tasks performed Cognition: [facility] for tasks performed  Antonieta Iba 11/03/2011, 5:53 PM

## 2011-11-06 ENCOUNTER — Ambulatory Visit (HOSPITAL_COMMUNITY)
Admission: RE | Admit: 2011-11-06 | Discharge: 2011-11-06 | Disposition: A | Discharge status: Home / Self Care | Payer: BC Managed Care – PPO | Source: Ambulatory Visit | Attending: Internal Medicine | Admitting: Internal Medicine

## 2011-11-06 NOTE — Progress Notes (Addendum)
Physical Therapy Re-evaluation/Discharge  Patient Details  Name: [name] MRN: [id number] Date of Birth: [birth year]  Today's Date: 11/06/2011 Time: 0802-0840 Time Calculation (min): 38 min Visit#: 19  of 19   Re-eval: 11/06/11 Diagnosis: B TKA Charges:  ROM testing, gait 10'  Subjective Symptoms/Limitations Symptoms: Pt. reports she slept in her own bed last night, has returned to her second job at [employer] and her swelling is getting better.  She currently has no pain. Pain Assessment Currently in Pain?: No/denies  Objective: RLE AROM (degrees) Right Knee Extension 0-130: 0  Right Knee Flexion 0-140: 102  RLE PROM (degrees) Right Knee Extension 0-130: 0  Right Knee Flexion 0-140: 110  LLE AROM (degrees) Left Knee Extension 0-130: 0  Left Knee Flexion 0-140: 104  LLE PROM (degrees) Left Knee Extension 0-130: 0 Left Knee Flexion 0-140: 110  Exercise/Treatments Aerobic Stationary Bike: 6' @ 3.0 seat 12 Tread Mill: 7' @ 1.46mph  Physical Therapy Assessment and Plan PT Assessment and Plan Clinical Impression Statement: Pt. able to ambulate on treadmill at 1. without visual gait deficits.  Pt. met all STG's/LTG's except ambulation without gait deficits; pt. encouraged to attend gym and continue walking program to work towards normailize gait and ROM.  Pt. is in agreement to POC and is independent with HEP.   PT Plan: Consulted with PT Lurena Joiner Ludie Hudon,PT) and Patient and in aggreement with discharge plan.  discharge to HEP.    Goals Home Exercise Program PT Goal: Perform Home Exercise Program - Progress: Met PT Short Term Goals Time to Complete Short Term Goals: 2 weeks PT Short Term Goal 1: Ambulate without an assistive device PT Short Term Goal 1 - Progress: Met PT Short Term Goal 2: ROM 0 to 100 to allow normalized gait and increase comfort of prolong sitting PT Short Term Goal 2 - Progress: Met PT Long Term Goals Time to Complete Long Term Goals: 4  weeks PT Long Term Goal 1: ROM to 115 B to allow pt to squat to pick items off the floor  PT Long Term Goal 1 - Progress: Met PT Long Term Goal 2: Strength wnl to allow patient to go up and down steps reciprocally  PT Long Term Goal 2 - Progress: Met Long Term Goal 3: Patient able to walk for an hour without increased discomfort Long Term Goal 3 Progress: Met Long Term Goal 4: Pt to be able to sleep in her bed Long Term Goal 4 Progress: Met PT Long Term Goal 5: Pt to return to work Long Term Goal 5 Progress: Met PT Long Term Goal 6: New as of 10/03/11- normalize heel to toe gait pattern.-2 weeks  (Pt. able to normalize gait with decreased speed/concentratio) Long Term Goal 6 Progress: Partly met  Problem List Patient Active Problem List  Diagnoses  . OTHER DYSPHAGIA  . CEREBROVASCULAR ACCIDENT, HX OF  . S/P TKR (total knee replacement)  . Hypertension  . Dyslipidemia  . GERD (gastroesophageal reflux disease)  . UTI (lower urinary tract infection)  . Stiffness of joint, lower leg  . Bilateral leg weakness    PT - End of Session Activity Tolerance: Patient tolerated treatment well General Behavior During Session: [facility] for tasks performed Cognition: [facility] for tasks performed   Amy B. Bascom Levels, PTA/ Rollene Rotunda Helmer Dull, PT, DPT  11/06/2011, 9:27 AM

## 2012-03-11 ENCOUNTER — Ambulatory Visit (HOSPITAL_COMMUNITY)
Admission: RE | Admit: 2012-03-11 | Discharge: 2012-03-11 | Disposition: A | Discharge status: Home / Self Care | Payer: BC Managed Care – PPO | Source: Ambulatory Visit | Attending: Internal Medicine | Admitting: Internal Medicine

## 2012-03-11 ENCOUNTER — Other Ambulatory Visit (HOSPITAL_COMMUNITY): Payer: Self-pay | Admitting: Internal Medicine

## 2012-03-11 DIAGNOSIS — M79609 Pain in unspecified limb: Secondary | ICD-10-CM | POA: Insufficient documentation

## 2012-03-11 DIAGNOSIS — R609 Edema, unspecified: Secondary | ICD-10-CM

## 2012-03-11 DIAGNOSIS — M7989 Other specified soft tissue disorders: Secondary | ICD-10-CM | POA: Insufficient documentation

## 2013-02-07 ENCOUNTER — Other Ambulatory Visit: Payer: Self-pay | Admitting: Gastroenterology

## 2013-02-07 NOTE — Telephone Encounter (Signed)
Needs OV for further refills

## 2013-02-08 ENCOUNTER — Encounter: Payer: Self-pay | Admitting: Gastroenterology

## 2013-02-08 NOTE — Telephone Encounter (Signed)
Mailed letter to patient to call our office to set up OV to further refills

## 2013-04-19 ENCOUNTER — Other Ambulatory Visit: Payer: Self-pay

## 2013-04-19 ENCOUNTER — Encounter: Payer: Self-pay | Admitting: Gastroenterology

## 2013-04-19 NOTE — Telephone Encounter (Signed)
Over 2 years since patient's last seen. No further refills until she is seen in the office.

## 2013-04-19 NOTE — Telephone Encounter (Signed)
Pt is aware of OV on 8/4 at 1130 and appt card was mailed

## 2013-04-19 NOTE — Telephone Encounter (Signed)
Darl Pikes can you please make her an appointment   Thanks

## 2013-05-02 ENCOUNTER — Other Ambulatory Visit: Payer: Self-pay

## 2013-05-02 ENCOUNTER — Encounter: Payer: Self-pay | Admitting: Gastroenterology

## 2013-05-03 MED ORDER — OMEPRAZOLE 20 MG PO CPDR: 20.0000 mg | DELAYED_RELEASE_CAPSULE | Freq: Two times a day (BID) | ORAL | Status: DC

## 2013-05-09 ENCOUNTER — Ambulatory Visit: Payer: BC Managed Care – PPO | Admitting: Gastroenterology

## 2013-05-09 ENCOUNTER — Telehealth: Payer: Self-pay | Admitting: Gastroenterology

## 2013-05-09 NOTE — Telephone Encounter (Signed)
Pt was a no show

## 2013-11-14 ENCOUNTER — Ambulatory Visit (HOSPITAL_COMMUNITY)
Admission: RE | Admit: 2013-11-14 | Discharge: 2013-11-14 | Disposition: A | Discharge status: Home / Self Care | Payer: BC Managed Care – PPO | Source: Ambulatory Visit | Attending: Internal Medicine | Admitting: Internal Medicine

## 2013-11-14 ENCOUNTER — Other Ambulatory Visit (HOSPITAL_COMMUNITY): Payer: Self-pay | Admitting: Internal Medicine

## 2013-11-14 DIAGNOSIS — R0602 Shortness of breath: Secondary | ICD-10-CM

## 2013-11-15 ENCOUNTER — Other Ambulatory Visit (HOSPITAL_COMMUNITY): Payer: Self-pay | Admitting: Internal Medicine

## 2013-11-15 ENCOUNTER — Ambulatory Visit (HOSPITAL_COMMUNITY)
Admission: RE | Admit: 2013-11-15 | Discharge: 2013-11-15 | Disposition: A | Discharge status: Home / Self Care | Payer: BC Managed Care – PPO | Source: Ambulatory Visit | Attending: Internal Medicine | Admitting: Internal Medicine

## 2013-11-15 DIAGNOSIS — R0602 Shortness of breath: Secondary | ICD-10-CM

## 2013-11-15 DIAGNOSIS — R7989 Other specified abnormal findings of blood chemistry: Secondary | ICD-10-CM

## 2013-11-15 LAB — POCT I-STAT CREATININE: Creatinine, Ser: 0.8 mg/dL (ref 0.50–1.10)

## 2013-11-15 MED ORDER — IOHEXOL 350 MG/ML SOLN
100.0000 mL | Freq: Once | INTRAVENOUS | Status: AC | PRN
  Administered 2013-11-15: 100 mL via INTRAVENOUS

## 2013-12-12 ENCOUNTER — Encounter: Payer: Self-pay | Admitting: Pulmonary Disease

## 2013-12-12 ENCOUNTER — Ambulatory Visit (INDEPENDENT_AMBULATORY_CARE_PROVIDER_SITE_OTHER): Payer: BC Managed Care – PPO | Admitting: Pulmonary Disease

## 2013-12-12 VITALS — BP 134/88 | HR 62 | Temp 97.6°F | Ht 68.5 in | Wt 284.0 lb

## 2013-12-12 DIAGNOSIS — R0609 Other forms of dyspnea: Secondary | ICD-10-CM | POA: Insufficient documentation

## 2013-12-12 DIAGNOSIS — R0989 Other specified symptoms and signs involving the circulatory and respiratory systems: Secondary | ICD-10-CM

## 2013-12-12 NOTE — Assessment & Plan Note (Signed)
The patient has dyspnea on exertion, but has never smoked with no history of asthma. Her CT chest shows no pulmonary embolus, but does show mild emphysematous changes and thoracic kyphosis. I suspect most of her breathing issues are related to obesity and deconditioning, and possibly some restriction from her kyphosis. I would like to do full pulmonary function studies for evaluation, and if normal, will need to decide about a trial of weight loss and conditioning versus proceeding with a cardiac workup. The patient is agreeable to this approach.

## 2013-12-12 NOTE — Progress Notes (Signed)
   Subjective:    Patient ID: [name], female    DOB: 12/06/1950, 63 y.o.   MRN: [id number]  HPI The patient is a 63 year old female who I've been asked to see for dyspnea. The patient was not necessarily aware of her symptoms, but she has been told by friends and family that her breathing appears to be worse over the last 2 months. The patient states that she does get winded walking up a flight of stairs, or bringing groceries in from the car. She has no issues doing light housework. She has had a recent CT chest which showed mild emphysematous changes, although the patient has never smoked. It also showed thoracic kyphosis, and no evidence for pulmonary emboli. The patient has no history of asthma or cardiac disease. She has gained 30 pounds over the last 2 years. She denies any significant cough or mucus production, but does have issues with postnasal drip and reflux disease. She has only had mild lower extremity edema.   Review of Systems  Constitutional: Negative for fever and unexpected weight change.  HENT: Positive for congestion, postnasal drip and sinus pressure. Negative for dental problem, ear pain, nosebleeds, rhinorrhea, sneezing, sore throat and trouble swallowing.   Eyes: Negative for redness and itching.  Respiratory: Positive for cough and shortness of breath. Negative for chest tightness and wheezing.   Cardiovascular: Negative for palpitations and leg swelling.  Gastrointestinal: Negative for nausea and vomiting.       Acid reflux//indigestion  Genitourinary: Negative for dysuria.  Musculoskeletal: Negative for joint swelling.  Skin: Negative for rash.  Neurological: Positive for headaches.  Hematological: Does not bruise/bleed easily.  Psychiatric/Behavioral: Negative for dysphoric mood. The patient is not nervous/anxious.        Objective:   Physical Exam Constitutional:  Obese female, no acute distress  HENT:  Nares patent without discharge  Oropharynx  without exudate, palate and uvula are normal  Eyes:  Perrla, eomi, no scleral icterus  Neck:  No JVD, no TMG  Cardiovascular:  Normal rate, regular rhythm, no rubs or gallops.  No murmurs        Intact distal pulses  Pulmonary :  Normal breath sounds, no stridor or respiratory distress   No rales, rhonchi, or wheezing  Abdominal:  Soft, nondistended, bowel sounds present.  No tenderness noted.   Musculoskeletal:  mild lower extremity edema noted.  Lymph Nodes:  No cervical lymphadenopathy noted  Skin:  No cyanosis noted  Neurologic:  Alert, appropriate, moves all 4 extremities without obvious deficit.         Assessment & Plan:

## 2013-12-12 NOTE — Patient Instructions (Signed)
Will schedule for breathing studies at [facility].  Will call you with results. If the studies are normal, would recommend a trial of weight loss/exercise program vs cardiac workup.

## 2013-12-21 ENCOUNTER — Encounter (HOSPITAL_COMMUNITY): Payer: BC Managed Care – PPO

## 2013-12-27 ENCOUNTER — Ambulatory Visit (HOSPITAL_COMMUNITY)
Admission: RE | Admit: 2013-12-27 | Discharge: 2013-12-27 | Disposition: A | Discharge status: Home / Self Care | Payer: BC Managed Care – PPO | Source: Ambulatory Visit | Attending: Pulmonary Disease | Admitting: Pulmonary Disease

## 2013-12-27 DIAGNOSIS — R0989 Other specified symptoms and signs involving the circulatory and respiratory systems: Principal | ICD-10-CM | POA: Insufficient documentation

## 2013-12-27 DIAGNOSIS — R0609 Other forms of dyspnea: Secondary | ICD-10-CM | POA: Insufficient documentation

## 2013-12-27 LAB — PULMONARY FUNCTION TEST
DL/VA % pred: 87 %
DL/VA: 4.66 ml/min/mmHg/L
DLCO cor % pred: 66 %
DLCO cor: 20.63 ml/min/mmHg
DLCO unc % pred: 66 %
DLCO unc: 20.63 ml/min/mmHg
FEF 25-75 Post: 1.93 L/sec
FEF 25-75 Pre: 1.97 L/sec
FEF2575-%Change-Post: -1 %
FEF2575-%Pred-Post: 74 %
FEF2575-%Pred-Pre: 75 %
FEV1-%Change-Post: 0 %
FEV1-%Pred-Post: 75 %
FEV1-%Pred-Pre: 75 %
FEV1-Post: 2.26 L
FEV1-Pre: 2.28 L
FEV1FVC-%Change-Post: 1 %
FEV1FVC-%Pred-Pre: 99 %
FEV6-%Change-Post: -2 %
FEV6-%Pred-Post: 75 %
FEV6-%Pred-Pre: 77 %
FEV6-Post: 2.86 L
FEV6-Pre: 2.93 L
FEV6FVC-%Change-Post: 0 %
FEV6FVC-%Pred-Post: 103 %
FEV6FVC-%Pred-Pre: 102 %
FVC-%Change-Post: -2 %
FVC-%Pred-Post: 73 %
FVC-%Pred-Pre: 75 %
FVC-Post: 2.86 L
FVC-Pre: 2.94 L
Post FEV1/FVC ratio: 79 %
Post FEV6/FVC ratio: 100 %
Pre FEV1/FVC ratio: 78 %
Pre FEV6/FVC Ratio: 100 %
RV % pred: 16 %
RV: 0.36 L
TLC % pred: 55 %
TLC: 3.19 L

## 2013-12-27 MED ORDER — ALBUTEROL SULFATE (2.5 MG/3ML) 0.083% IN NEBU
2.5000 mg | INHALATION_SOLUTION | Freq: Once | RESPIRATORY_TRACT | Status: AC
  Administered 2013-12-27: 2.5 mg via RESPIRATORY_TRACT

## 2014-10-05 ENCOUNTER — Emergency Department (HOSPITAL_COMMUNITY): Payer: BC Managed Care – PPO

## 2014-10-05 ENCOUNTER — Emergency Department (HOSPITAL_COMMUNITY)
Admission: EM | Admit: 2014-10-05 | Discharge: 2014-10-05 | Disposition: A | Discharge status: Home / Self Care | Payer: BC Managed Care – PPO | Attending: Emergency Medicine | Admitting: Emergency Medicine

## 2014-10-05 ENCOUNTER — Encounter (HOSPITAL_COMMUNITY): Payer: Self-pay | Admitting: *Deleted

## 2014-10-05 DIAGNOSIS — Y998 Other external cause status: Secondary | ICD-10-CM | POA: Diagnosis not present

## 2014-10-05 DIAGNOSIS — W208XXA Other cause of strike by thrown, projected or falling object, initial encounter: Secondary | ICD-10-CM | POA: Diagnosis not present

## 2014-10-05 DIAGNOSIS — Y9389 Activity, other specified: Secondary | ICD-10-CM | POA: Insufficient documentation

## 2014-10-05 DIAGNOSIS — I10 Essential (primary) hypertension: Secondary | ICD-10-CM | POA: Diagnosis not present

## 2014-10-05 DIAGNOSIS — E785 Hyperlipidemia, unspecified: Secondary | ICD-10-CM | POA: Insufficient documentation

## 2014-10-05 DIAGNOSIS — T1490XA Injury, unspecified, initial encounter: Secondary | ICD-10-CM

## 2014-10-05 DIAGNOSIS — Y9289 Other specified places as the place of occurrence of the external cause: Secondary | ICD-10-CM | POA: Diagnosis not present

## 2014-10-05 DIAGNOSIS — S99922A Unspecified injury of left foot, initial encounter: Secondary | ICD-10-CM | POA: Diagnosis present

## 2014-10-05 DIAGNOSIS — Z7982 Long term (current) use of aspirin: Secondary | ICD-10-CM | POA: Insufficient documentation

## 2014-10-05 DIAGNOSIS — S9032XA Contusion of left foot, initial encounter: Secondary | ICD-10-CM | POA: Insufficient documentation

## 2014-10-05 DIAGNOSIS — Z79899 Other long term (current) drug therapy: Secondary | ICD-10-CM | POA: Diagnosis not present

## 2014-10-05 NOTE — Discharge Instructions (Signed)
Contusion °A contusion is a deep bruise. Contusions happen when an injury causes bleeding under the skin. Signs of bruising include pain, puffiness (swelling), and discolored skin. The contusion may turn blue, purple, or yellow. °HOME CARE  °· Put ice on the injured area. °¨ Put ice in a plastic bag. °¨ Place a towel between your skin and the bag. °¨ Leave the ice on for 15-20 minutes, 03-04 times a day. °· Only take medicine as told by your doctor. °· Rest the injured area. °· If possible, raise (elevate) the injured area to lessen puffiness. °GET HELP RIGHT AWAY IF:  °· You have more bruising or puffiness. °· You have pain that is getting worse. °· Your puffiness or pain is not helped by medicine. °MAKE SURE YOU:  °· Understand these instructions. °· Will watch your condition. °· Will get help right away if you are not doing well or get worse. °Document Released: 03/10/2008 Document Revised: 12/15/2011 Document Reviewed: 07/28/2011 °Exit[employer]® Patient Information ©2015 [employer]. This information is not intended to replace advice given to you by your health care provider. Make sure you discuss any questions you have with your health care provider. ° °

## 2014-10-05 NOTE — ED Notes (Signed)
Patient with no complaints at this time. Respirations even and unlabored. Skin warm/dry. Discharge instructions reviewed with patient at this time. Patient given opportunity to voice concerns/ask questions. Patient discharged at this time and left Emergency Department with steady gait.   

## 2014-10-05 NOTE — ED Provider Notes (Signed)
CSN: [id number]     Arrival date & time 10/05/14  1557 History   First MD Initiated Contact with Patient 10/05/14 1615     Chief Complaint  Patient presents with  . Foot Swelling     (Consider location/radiation/quality/duration/timing/severity/associated sxs/prior Treatment) HPI  [name] is a 63 y.o. female who presents to the Emergency Department complaining of left foot pain for 6 days.  She states that she dropped a large glass jar onto her foot and has developed swelling, bruising and pain to her foot since then.  Pain is worse with weight bearing and improves somewhat with rest and ibuprofen.  She denies numbness or weakness of her foot or ankle or open wounds.     Past Medical History  Diagnosis Date  . HTN (hypertension)   . Hyperlipidemia    Past Surgical History  Procedure Laterality Date  . Colonoscopy  04/13/2006    FAO:ZHYQMV colon without evidence of polyps, masses, inflammatory changes diverticular or vascular ectasia/Normal retroflex view of the rectum  . Replacement total knee Bilateral   . Breast reduction surgery     Family History  Problem Relation Age of Onset  . Congestive Heart Failure Mother   . Diabetes Mother   . Diabetes Father   . Hypertension Mother   . Osteoarthritis Father    History  Substance Use Topics  . Smoking status: Never Smoker   . Smokeless tobacco: Not on file  . Alcohol Use: No   OB History    No data available     Review of Systems  Constitutional: Negative for fever and chills.  Genitourinary: Negative for dysuria and difficulty urinating.  Musculoskeletal: Positive for arthralgias (left foot swelling and tenderness). Negative for joint swelling and gait problem.  Skin: Negative for color change and wound.  Neurological: Negative for weakness and numbness.  All other systems reviewed and are negative.     Allergies  Review of patient's allergies indicates no known allergies.  Home Medications   Prior to  Admission medications   Medication Sig Start Date End Date Taking? Authorizing Provider  aspirin 325 MG tablet Take 325 mg by mouth daily.    Historical Provider, MD  Calcium-Magnesium-Zinc 1000-400-15 MG TABS Take 1 tablet by mouth daily.      Historical Provider, MD  Cyanocobalamin (VITAMIN B-12 PO) Take 1 tablet by mouth daily.      Historical Provider, MD  omeprazole (PRILOSEC) 20 MG capsule Take 20 mg by mouth daily. 3-4 x week 05/02/13   Orvil Feil, NP  oxybutynin (DITROPAN) 5 MG tablet Take 10 mg by mouth daily.      Historical Provider, MD  ramipril (ALTACE) 2.5 MG capsule Take 2.5 mg by mouth daily.      Historical Provider, MD  simvastatin (ZOCOR) 40 MG tablet Take 40 mg by mouth at bedtime.      Historical Provider, MD   BP 138/84 mmHg  Pulse 65  Temp(Src) 97.6 F (36.4 C) (Oral)  Resp 16  Ht 5\' 9"  (1.753 m)  Wt 280 lb (127.007 kg)  BMI 41.33 kg/m2  SpO2 100% Physical Exam  Constitutional: She is oriented to person, place, and time. She appears well-developed and well-nourished. No distress.  HENT:  Head: Normocephalic and atraumatic.  Cardiovascular: Normal rate, regular rhythm, normal heart sounds and intact distal pulses.   No murmur heard. Pulmonary/Chest: Effort normal and breath sounds normal. No respiratory distress.  Musculoskeletal: She exhibits edema and tenderness.  Tenderness, ecchymosis  and STS of the dorsal left foot.   ROM is preserved.  DP pulse is brisk,distal sensation intact.  No erythema, abrasion or bony deformity.  No proximal tenderness. Bilateral LE pitting edema.  Neurological: She is alert and oriented to person, place, and time. She exhibits normal muscle tone. Coordination normal.  Skin: Skin is warm and dry.  Nursing note and vitals reviewed.   ED Course  Procedures (including critical care time) Labs Review Labs Reviewed - No data to display  Imaging Review Dg Foot Complete Left  10/05/2014   CLINICAL DATA:  Bruising and pain across  the metatarsals  EXAM: LEFT FOOT - COMPLETE 3+ VIEW  COMPARISON:  None.  FINDINGS: There is no evidence of fracture or dislocation. Hallux valgus of the left foot with mild lateral subluxation of the first proximal phalanx relative to the metatarsal head. Mild osteoarthritis of the first MTP joint. Mild degenerative changes of the navicular cuneiform joint and second TMT joint. Focal soft tissue swelling over the dorsal aspect of the foot overlying the metatarsals likely reflecting a hematoma.  IMPRESSION: No acute osseous injury of the left foot.  Focal soft tissue swelling over the dorsal aspect of the foot overlying the metatarsals likely reflecting a hematoma.   Electronically Signed   By: Kathreen Devoid   On: 10/05/2014 16:52     EKG Interpretation None      MDM   Final diagnoses:  Contusion, foot, left, initial encounter     Pt with contusion to left dorsal foot.  NV intact.  Ambulatory with steady gait.  Currently wearing compression stockings for the LE edema which is chronic.  XR negative for fx.  She agrees to elevate her foot when possible.  Ibuprofen for pain and close PMD f/u if needed.  She appears stable for discharge.   Monta Police L. Vanessa [place], PA-C 10/05/14 1914  Shaune Pollack, MD 10/06/14 [phone]

## 2014-10-05 NOTE — ED Notes (Signed)
Dropped glass jar on top of L foot 09/29/14.  Has bruising of toes and top of foot.  Able to bear weight.

## 2016-02-27 ENCOUNTER — Inpatient Hospital Stay (HOSPITAL_COMMUNITY)
Admission: EM | Admit: 2016-02-27 | Discharge: 2016-03-01 | DRG: 418 | Disposition: A | Discharge status: Home / Self Care | Payer: BLUE CROSS/BLUE SHIELD | Attending: General Surgery | Admitting: General Surgery

## 2016-02-27 ENCOUNTER — Emergency Department (HOSPITAL_COMMUNITY): Payer: BLUE CROSS/BLUE SHIELD

## 2016-02-27 ENCOUNTER — Encounter (HOSPITAL_COMMUNITY): Payer: Self-pay | Admitting: Emergency Medicine

## 2016-02-27 DIAGNOSIS — K821 Hydrops of gallbladder: Secondary | ICD-10-CM | POA: Diagnosis present

## 2016-02-27 DIAGNOSIS — R509 Fever, unspecified: Secondary | ICD-10-CM | POA: Diagnosis not present

## 2016-02-27 DIAGNOSIS — Z7982 Long term (current) use of aspirin: Secondary | ICD-10-CM | POA: Diagnosis not present

## 2016-02-27 DIAGNOSIS — I10 Essential (primary) hypertension: Secondary | ICD-10-CM | POA: Diagnosis present

## 2016-02-27 DIAGNOSIS — Z96653 Presence of artificial knee joint, bilateral: Secondary | ICD-10-CM | POA: Diagnosis present

## 2016-02-27 DIAGNOSIS — R05 Cough: Secondary | ICD-10-CM | POA: Diagnosis not present

## 2016-02-27 DIAGNOSIS — E785 Hyperlipidemia, unspecified: Secondary | ICD-10-CM | POA: Diagnosis present

## 2016-02-27 DIAGNOSIS — K810 Acute cholecystitis: Secondary | ICD-10-CM | POA: Diagnosis present

## 2016-02-27 DIAGNOSIS — K8000 Calculus of gallbladder with acute cholecystitis without obstruction: Secondary | ICD-10-CM | POA: Diagnosis present

## 2016-02-27 DIAGNOSIS — Z8249 Family history of ischemic heart disease and other diseases of the circulatory system: Secondary | ICD-10-CM | POA: Diagnosis not present

## 2016-02-27 DIAGNOSIS — Z833 Family history of diabetes mellitus: Secondary | ICD-10-CM | POA: Diagnosis not present

## 2016-02-27 DIAGNOSIS — R1011 Right upper quadrant pain: Secondary | ICD-10-CM | POA: Diagnosis present

## 2016-02-27 DIAGNOSIS — R0682 Tachypnea, not elsewhere classified: Secondary | ICD-10-CM

## 2016-02-27 HISTORY — DX: Gastro-esophageal reflux disease without esophagitis: K21.9

## 2016-02-27 HISTORY — DX: Transient cerebral ischemic attack, unspecified: G45.9

## 2016-02-27 LAB — CBC WITH DIFFERENTIAL/PLATELET
Basophils Absolute: 0 10*3/uL (ref 0.0–0.1)
Basophils Relative: 0 %
Eosinophils Absolute: 0 10*3/uL (ref 0.0–0.7)
Eosinophils Relative: 0 %
HCT: 44.2 % (ref 36.0–46.0)
Hemoglobin: 14.7 g/dL (ref 12.0–15.0)
Lymphocytes Relative: 12 %
Lymphs Abs: 2.1 10*3/uL (ref 0.7–4.0)
MCH: 29.2 pg (ref 26.0–34.0)
MCHC: 33.3 g/dL (ref 30.0–36.0)
MCV: 87.7 fL (ref 78.0–100.0)
Monocytes Absolute: 1.1 10*3/uL — ABNORMAL HIGH (ref 0.1–1.0)
Monocytes Relative: 6 %
Neutro Abs: 13.6 10*3/uL — ABNORMAL HIGH (ref 1.7–7.7)
Neutrophils Relative %: 82 %
Platelets: 312 10*3/uL (ref 150–400)
RBC: 5.04 MIL/uL (ref 3.87–5.11)
RDW: 13.7 % (ref 11.5–15.5)
WBC: 16.8 10*3/uL — ABNORMAL HIGH (ref 4.0–10.5)

## 2016-02-27 LAB — LIPASE, BLOOD: Lipase: 15 U/L (ref 11–51)

## 2016-02-27 LAB — COMPREHENSIVE METABOLIC PANEL
ALT: 15 U/L (ref 14–54)
AST: 14 U/L — ABNORMAL LOW (ref 15–41)
Albumin: 4 g/dL (ref 3.5–5.0)
Alkaline Phosphatase: 104 U/L (ref 38–126)
Anion gap: 7 (ref 5–15)
BUN: 11 mg/dL (ref 6–20)
CO2: 25 mmol/L (ref 22–32)
Calcium: 9.2 mg/dL (ref 8.9–10.3)
Chloride: 101 mmol/L (ref 101–111)
Creatinine, Ser: 0.67 mg/dL (ref 0.44–1.00)
GFR calc Af Amer: >60 mL/min (ref >60)
GFR calc non Af Amer: >60 mL/min (ref >60)
Glucose, Bld: 139 mg/dL — ABNORMAL HIGH (ref 65–99)
Potassium: 3.9 mmol/L (ref 3.5–5.1)
Sodium: 133 mmol/L — ABNORMAL LOW (ref 135–145)
Total Bilirubin: 0.8 mg/dL (ref 0.3–1.2)
Total Protein: 7.8 g/dL (ref 6.5–8.1)

## 2016-02-27 MED ORDER — HYDROMORPHONE HCL 1 MG/ML IJ SOLN
1.0000 mg | Freq: Once | INTRAMUSCULAR | Status: AC
  Administered 2016-02-27: 1 mg via INTRAVENOUS
  Filled 2016-02-27: qty 1

## 2016-02-27 MED ORDER — ACETAMINOPHEN 650 MG RE SUPP
650.0000 mg | Freq: Four times a day (QID) | RECTAL | Status: DC | PRN
Start: 2016-02-27 — End: 2016-03-01

## 2016-02-27 MED ORDER — MORPHINE SULFATE (PF) 4 MG/ML IV SOLN
4.0000 mg | Freq: Once | INTRAVENOUS | Status: AC
  Administered 2016-02-27: 4 mg via INTRAVENOUS
  Filled 2016-02-27: qty 1

## 2016-02-27 MED ORDER — HEPARIN SODIUM (PORCINE) 5000 UNIT/ML IJ SOLN
5000.0000 [IU] | Freq: Three times a day (TID) | INTRAMUSCULAR | Status: DC
  Administered 2016-02-27 – 2016-02-28 (×3): 5000 [IU] via SUBCUTANEOUS
  Filled 2016-02-27 (×4): qty 1

## 2016-02-27 MED ORDER — SIMVASTATIN 20 MG PO TABS
40.0000 mg | ORAL_TABLET | Freq: Every day | ORAL | Status: DC
  Administered 2016-02-27 – 2016-02-29 (×3): 40 mg via ORAL
  Filled 2016-02-27: qty 2
  Filled 2016-02-27 (×2): qty 4

## 2016-02-27 MED ORDER — OXYBUTYNIN CHLORIDE 5 MG PO TABS
5.0000 mg | ORAL_TABLET | Freq: Two times a day (BID) | ORAL | Status: DC
  Administered 2016-02-27 – 2016-03-01 (×5): 5 mg via ORAL
  Filled 2016-02-27 (×6): qty 1

## 2016-02-27 MED ORDER — RAMIPRIL 1.25 MG PO CAPS
2.5000 mg | ORAL_CAPSULE | Freq: Every day | ORAL | Status: DC
  Administered 2016-02-27 – 2016-03-01 (×3): 2.5 mg via ORAL
  Filled 2016-02-27: qty 1
  Filled 2016-02-27: qty 2
  Filled 2016-02-27 (×2): qty 1
  Filled 2016-02-27 (×2): qty 2

## 2016-02-27 MED ORDER — MORPHINE SULFATE (PF) 2 MG/ML IV SOLN
2.0000 mg | INTRAVENOUS | Status: DC | PRN
  Administered 2016-02-27: 2 mg via INTRAVENOUS
  Filled 2016-02-27: qty 1

## 2016-02-27 MED ORDER — PANTOPRAZOLE SODIUM 40 MG PO TBEC
40.0000 mg | DELAYED_RELEASE_TABLET | Freq: Every day | ORAL | Status: DC
  Administered 2016-02-27 – 2016-03-01 (×3): 40 mg via ORAL
  Filled 2016-02-27 (×3): qty 1

## 2016-02-27 MED ORDER — ONDANSETRON HCL 4 MG/2ML IJ SOLN
4.0000 mg | Freq: Four times a day (QID) | INTRAMUSCULAR | Status: DC | PRN
  Administered 2016-02-27: 4 mg via INTRAVENOUS
  Filled 2016-02-27: qty 2

## 2016-02-27 MED ORDER — PIPERACILLIN-TAZOBACTAM 3.375 G IVPB 30 MIN
3.3750 g | Freq: Once | INTRAVENOUS | Status: AC
  Administered 2016-02-27: 3.375 g via INTRAVENOUS
  Filled 2016-02-27: qty 50

## 2016-02-27 MED ORDER — ONDANSETRON HCL 4 MG/2ML IJ SOLN
4.0000 mg | Freq: Once | INTRAMUSCULAR | Status: AC
  Administered 2016-02-27: 4 mg via INTRAVENOUS

## 2016-02-27 MED ORDER — ACETAMINOPHEN 325 MG PO TABS
650.0000 mg | ORAL_TABLET | Freq: Four times a day (QID) | ORAL | Status: DC | PRN
  Administered 2016-02-27 – 2016-02-28 (×2): 650 mg via ORAL
  Filled 2016-02-27 (×2): qty 2

## 2016-02-27 MED ORDER — ONDANSETRON HCL 4 MG/2ML IJ SOLN
INTRAMUSCULAR | Status: AC
  Filled 2016-02-27: qty 2

## 2016-02-27 MED ORDER — SODIUM CHLORIDE 0.9 % IV SOLN
INTRAVENOUS | Status: DC
  Administered 2016-02-28 – 2016-02-29 (×3): via INTRAVENOUS

## 2016-02-27 MED ORDER — HYDROMORPHONE HCL 1 MG/ML IJ SOLN
1.0000 mg | INTRAMUSCULAR | Status: DC | PRN
  Administered 2016-02-27 – 2016-02-28 (×5): 1 mg via INTRAVENOUS
  Filled 2016-02-27 (×5): qty 1

## 2016-02-27 MED ORDER — AMPICILLIN-SULBACTAM SODIUM 1.5 (1-0.5) G IJ SOLR
1.5000 g | Freq: Four times a day (QID) | INTRAMUSCULAR | Status: DC
  Administered 2016-02-27 – 2016-03-01 (×10): 1.5 g via INTRAVENOUS
  Filled 2016-02-27 (×20): qty 1.5

## 2016-02-27 NOTE — ED Notes (Signed)
Patient will call when she can use the restroom.

## 2016-02-27 NOTE — H&P (Addendum)
History and Physical    [name] [id number] DOB: 09-Oct-1950 DOA: 02/27/2016  PCP: Asencion Noble, MD  Patient coming from: Home  Chief Complaint: abd pain  HPI: [name] is a 65 y.o. female with medical history significant of hypertension and hyperlipidemia who presents emerged department with complaints of abdominal pain.   ED Course: In the ED, patient was noted to have ultrasound findings suggestive of cholelithiasis with acute cholecystitis. Patient was started on empiric Zosyn. General surgery was consulted who recommended medical admission. Hospitalist service was consulted for consideration for admission.  Review of Systems:  Review of Systems  Constitutional: Negative for chills, weight loss and malaise/fatigue.  HENT: Negative for ear pain and tinnitus.   Eyes: Negative for pain and discharge.  Respiratory: Negative for sputum production and shortness of breath.   Gastrointestinal: Positive for abdominal pain. Negative for blood in stool.  Genitourinary: Negative for hematuria and flank pain.  Musculoskeletal: Negative for back pain, joint pain and neck pain.  Neurological: Negative for tingling, tremors and loss of consciousness.  Psychiatric/Behavioral: Negative for hallucinations. The patient does not have insomnia.      Past Medical History  Diagnosis Date  . HTN (hypertension)   . Hyperlipidemia     Past Surgical History  Procedure Laterality Date  . Colonoscopy  04/13/2006    [id number] colon without evidence of polyps, masses, inflammatory changes diverticular or vascular ectasia/Normal retroflex view of the rectum  . Replacement total knee Bilateral   . Breast reduction surgery       reports that she has never smoked. She does not have any smokeless tobacco history on file. She reports that she does not drink alcohol or use illicit drugs.  No Known Allergies  Family History  Problem Relation Age of Onset  . Congestive Heart Failure Mother     . Diabetes Mother   . Diabetes Father   . Hypertension Mother   . Osteoarthritis Father      Prior to Admission medications   Medication Sig Start Date End Date Taking? Authorizing Provider  aspirin 325 MG tablet Take 325 mg by mouth daily.   Yes Historical Provider, MD  Calcium-Magnesium-Zinc 1000-400-15 MG TABS Take 1 tablet by mouth daily.     Yes Historical Provider, MD  omeprazole (PRILOSEC) 20 MG capsule Take 20 mg by mouth daily. 3-4 x week 05/02/13  Yes Orvil Feil, NP  oxybutynin (DITROPAN) 5 MG tablet Take 5 mg by mouth 2 (two) times daily.    Yes Historical Provider, MD  ramipril (ALTACE) 2.5 MG capsule Take 2.5 mg by mouth daily.     Yes Historical Provider, MD  simvastatin (ZOCOR) 40 MG tablet Take 40 mg by mouth at bedtime.     Yes Historical Provider, MD    Physical Exam: Filed Vitals:   02/27/16 1109 02/27/16 1311  BP: 148/80 136/74  Pulse: 92 82  Temp: 97.8 F (36.6 C) 98.5 F (36.9 C)  TempSrc:  Oral  Resp: 18 16  Weight: 127.007 kg (280 lb)   SpO2: 98% 100%      Constitutional: NAD, calm, comfortable Filed Vitals:   02/27/16 1109 02/27/16 1311  BP: 148/80 136/74  Pulse: 92 82  Temp: 97.8 F (36.6 C) 98.5 F (36.9 C)  TempSrc:  Oral  Resp: 18 16  Weight: 127.007 kg (280 lb)   SpO2: 98% 100%   Eyes: PERRL, lids and conjunctivae normal ENMT: Mucous membranes are moist. Posterior pharynx clear of any exudate or  lesions.Normal dentition.  Neck: normal, supple, no masses, no thyromegaly Respiratory: clear to auscultation bilaterally, no wheezing, no crackles. Normal respiratory effort. No accessory muscle use.  Cardiovascular: Regular rate and rhythm, no murmurs / rubs / gallops. No extremity edema. 2+ pedal pulses. No carotid bruits.  Abdomen: Right upper quadrant tenderness, obese, positive bowel sounds Musculoskeletal: no clubbing / cyanosis. No joint deformity upper and lower extremities. Good ROM, no contractures. Normal muscle tone.  Skin: no  rashes, lesions, ulcers. No induration Neurologic: CN 2-12 grossly intact. Sensation intact, DTR normal. Strength 5/5 in all 4.  Psychiatric: Normal judgment and insight. Alert and oriented x 3. Normal mood.    Labs on Admission: I have personally reviewed following labs and imaging studies  CBC:  Recent Labs Lab 02/27/16 1110  WBC 16.8*  NEUTROABS 13.6*  HGB 14.7  HCT 44.2  MCV 87.7  PLT 123456   Basic Metabolic Panel:  Recent Labs Lab 02/27/16 1110  NA 133*  K 3.9  CL 101  CO2 25  GLUCOSE 139*  BUN 11  CREATININE 0.67  CALCIUM 9.2   GFR: CrCl cannot be calculated (Unknown ideal weight.). Liver Function Tests:  Recent Labs Lab 02/27/16 1110  AST 14*  ALT 15  ALKPHOS 104  BILITOT 0.8  PROT 7.8  ALBUMIN 4.0    Recent Labs Lab 02/27/16 1133  LIPASE 15   No results for input(s): AMMONIA in the last 168 hours. Coagulation Profile: No results for input(s): INR, PROTIME in the last 168 hours. Cardiac Enzymes: No results for input(s): CKTOTAL, CKMB, CKMBINDEX, TROPONINI in the last 168 hours. BNP (last 3 results) No results for input(s): PROBNP in the last 8760 hours. HbA1C: No results for input(s): HGBA1C in the last 72 hours. CBG: No results for input(s): GLUCAP in the last 168 hours. Lipid Profile: No results for input(s): CHOL, HDL, LDLCALC, TRIG, CHOLHDL, LDLDIRECT in the last 72 hours. Thyroid Function Tests: No results for input(s): TSH, T4TOTAL, FREET4, T3FREE, THYROIDAB in the last 72 hours. Anemia Panel: No results for input(s): VITAMINB12, FOLATE, FERRITIN, TIBC, IRON, RETICCTPCT in the last 72 hours. Urine analysis:    Component Value Date/Time   COLORURINE YELLOW 07/23/2011 1100   APPEARANCEUR CLEAR 07/23/2011 1100   LABSPEC 1.016 07/23/2011 1100   PHURINE 6.5 07/23/2011 1100   GLUCOSEU NEGATIVE 07/23/2011 1100   HGBUR NEGATIVE 07/23/2011 1100   BILIRUBINUR NEGATIVE 07/23/2011 1100   KETONESUR NEGATIVE 07/23/2011 1100   PROTEINUR  NEGATIVE 07/23/2011 1100   UROBILINOGEN 0.2 07/23/2011 1100   NITRITE NEGATIVE 07/23/2011 1100   LEUKOCYTESUR SMALL* 07/23/2011 1100   Sepsis Labs: !!!!!!!!!!!!!!!!!!!!!!!!!!!!!!!!!!!!!!!!!!!! @LABRCNTIP (procalcitonin:4,lacticidven:4) )No results found for this or any previous visit (from the past 240 hour(s)).   Radiological Exams on Admission: US Abdomen Complete  02/27/2016  CLINICAL DATA:  Abdominal pain EXAM: ABDOMEN ULTRASOUND COMPLETE COMPARISON:  None. FINDINGS: Gallbladder: Distended gallbladder with cholelithiasis. Gallbladder wall thickening measuring 5.1 mm. Common bile duct: Diameter: 10 mm.  No choledocholithiasis. Liver: No focal lesion identified. Increased hepatic parenchymal echogenicity. IVC: No abnormality visualized. Pancreas: Visualized portion unremarkable. Spleen: Size and appearance within normal limits. Right Kidney: Length: 12.6 cm. Echogenicity within normal limits. No mass or hydronephrosis visualized. Left Kidney: Length: 12.9 cm. Echogenicity within normal limits. No mass or hydronephrosis visualized. Abdominal aorta: No aneurysm visualized. Other findings: None. IMPRESSION: 1. Distended gallbladder with cholelithiasis and gallbladder wall thickening concerning for acute cholecystitis. 2. Increased hepatic echogenicity as can be seen with hepatic steatosis. Electronically Signed   By: Elbert Ewings  Patel   On: 02/27/2016 13:19   Dg Chest Portable 1 View  02/27/2016  CLINICAL DATA:  Right upper quadrant pain, nausea, vomiting EXAM: PORTABLE CHEST 1 VIEW COMPARISON:  11/14/2013 FINDINGS: The heart size and mediastinal contours are within normal limits. Both lungs are clear. The visualized skeletal structures are unremarkable. IMPRESSION: No active disease. Electronically Signed   By: Kathreen Devoid   On: 02/27/2016 13:59      Assessment/Plan Principal Problem:   Acute cholecystitis Active Problems:   Hypertension   Dyslipidemia   Acute cholecystitis -Continue empiric  antibiotics -General surgery consulted -Currently not septic -We'll admit to [place] -Patient currently remains symptomatic. Will keep patient nothing by mouth with sips with meds for now. -Will hold aspirin secondary to possible upcoming surgery Hypertension -Blood pressure stable -Continue monitor Hyperlipidemia -Patient on statin at home   DVT prophylaxis: Heparin subcutaneous Code Status: Full Family Communication: Patient in room leads Disposition Plan: Uncertain at this time Consults called: General surgery Admission status: Admit to med-surg   CHIU, Orpah Melter MD [facility] Pager [phone]  If 7PM-7AM, please contact night-coverage [email or web address] Password [email or web address]  02/27/2016, 2:17 PM

## 2016-02-27 NOTE — ED Notes (Signed)
Pt ate 25 % of dinner, pt states she has decreased appetite. Pt drinking ginger ale.

## 2016-02-27 NOTE — ED Notes (Signed)
Pt c/o ruq abd pain with n/v since last night. Pt sent by Dr. Willey Blade for r/o cholecystitis.

## 2016-02-27 NOTE — Progress Notes (Signed)
Pharmacy Antibiotic Note  CAMYRA CHAMPA is a 65 y.o. female admitted on 02/27/2016 with intra-abdominal infection.  Pharmacy has been consulted for UNASYN dosing.  Obese, SCr at baseline, OK.  Plan: Unasyn 1.5gm IV q6hrs Monitor labs, progress, c/s  Weight: 280 lb (127.007 kg)  Temp (24hrs), Avg:98.2 F (36.8 C), Min:97.8 F (36.6 C), Max:98.5 F (36.9 C)   Recent Labs Lab 02/27/16 1110  WBC 16.8*  CREATININE 0.67    CrCl cannot be calculated (Unknown ideal weight.).    No Known Allergies  Anti-infectives    Start     Dose/Rate Route Frequency Ordered Stop   02/27/16 1800  ampicillin-sulbactam (UNASYN) 1.5 g in sodium chloride 0.9 % 50 mL IVPB     1.5 g 100 mL/hr over 30 Minutes Intravenous Every 6 hours 02/27/16 1606     02/27/16 1330  piperacillin-tazobactam (ZOSYN) IVPB 3.375 g     3.375 g 100 mL/hr over 30 Minutes Intravenous  Once 02/27/16 1326 02/27/16 1410      Thank you for allowing pharmacy to be a part of this patient's care.  Hart Robinsons A 02/27/2016 4:07 PM

## 2016-02-27 NOTE — ED Provider Notes (Signed)
CSN: [id number]     Arrival date & time 02/27/16  1104 History  By signing my name below, I, Nicole Kindred, attest that this documentation has been prepared under the direction and in the presence of Ripley Fraise, MD.   Electronically Signed: Nicole Kindred, ED Scribe. 02/27/2016. 11:56 AM   Chief Complaint  Patient presents with  . Abdominal Pain     Patient is a 65 y.o. female presenting with abdominal pain. The history is provided by the patient.  Abdominal Pain Pain location:  RUQ Pain radiates to:  Does not radiate Pain severity:  Moderate Onset quality:  Gradual Duration:  1 day Timing:  Constant Progression:  Unchanged Chronicity:  New Relieved by:  Nothing Worsened by:  Eating Ineffective treatments:  None tried Associated symptoms: nausea and vomiting   Associated symptoms: no chest pain, no cough, no diarrhea and no shortness of breath    HPI Comments: [name] is a 65 y.o. female who presents to the Emergency Department complaining of gradual onset, constant RUQ abdominal pain, ongoing since last night. Pt reports associated nausea and emesis. Pt was seen by Dr. Willey Blade today and sent to ED for further evaluation. No other associated symptoms noted. Her pain is worse with eating. No other worsening or alleviating factors noted. Pt denies hematochezia, melena, chest pain, shortness of breath, cough, diarrhea, or any other pertinent symptoms.   Past Medical History  Diagnosis Date  . HTN (hypertension)   . Hyperlipidemia    Past Surgical History  Procedure Laterality Date  . Colonoscopy  04/13/2006    [id number] colon without evidence of polyps, masses, inflammatory changes diverticular or vascular ectasia/Normal retroflex view of the rectum  . Replacement total knee Bilateral   . Breast reduction surgery     Family History  Problem Relation Age of Onset  . Congestive Heart Failure Mother   . Diabetes Mother   . Diabetes Father   . Hypertension  Mother   . Osteoarthritis Father    Social History  Substance Use Topics  . Smoking status: Never Smoker   . Smokeless tobacco: None  . Alcohol Use: No   OB History    No data available     Review of Systems  Respiratory: Negative for cough and shortness of breath.   Cardiovascular: Negative for chest pain.  Gastrointestinal: Positive for nausea, vomiting and abdominal pain. Negative for diarrhea and blood in stool.  All other systems reviewed and are negative.   Allergies  Review of patient's allergies indicates no known allergies.  Home Medications   Prior to Admission medications   Medication Sig Start Date End Date Taking? Authorizing Provider  aspirin 325 MG tablet Take 325 mg by mouth daily.   Yes Historical Provider, MD  Calcium-Magnesium-Zinc 1000-400-15 MG TABS Take 1 tablet by mouth daily.     Yes Historical Provider, MD  omeprazole (PRILOSEC) 20 MG capsule Take 20 mg by mouth daily. 3-4 x week 05/02/13  Yes Orvil Feil, NP  oxybutynin (DITROPAN) 5 MG tablet Take 5 mg by mouth 2 (two) times daily.    Yes Historical Provider, MD  ramipril (ALTACE) 2.5 MG capsule Take 2.5 mg by mouth daily.     Yes Historical Provider, MD  simvastatin (ZOCOR) 40 MG tablet Take 40 mg by mouth at bedtime.     Yes Historical Provider, MD   BP 148/80 mmHg  Pulse 92  Temp(Src) 97.8 F (36.6 C)  Resp 18  Wt 280 lb (127.007  kg)  SpO2 98% Physical Exam CONSTITUTIONAL: Well developed/well nourished HEAD: Normocephalic/atraumatic EYES: EOMI/PERRL, on scleral icterus  ENMT: Mucous membranes moist NECK: supple no meningeal signs SPINE/BACK:entire spine nontender CV: S1/S2 noted, no murmurs/rubs/gallops noted LUNGS: Lungs are clear to auscultation bilaterally, no apparent distress ABDOMEN: soft, no rebound or guarding, bowel sounds noted throughout abdomen, moderate RUQ and epigastric TTP  GU:no cva tenderness NEURO: Pt is awake/alert/appropriate, moves all extremitiesx4.  No facial  droop.   EXTREMITIES: pulses normal/equal, full ROM SKIN: warm, color normal PSYCH: no abnormalities of mood noted, alert and oriented to situation  ED Course  Procedures  DIAGNOSTIC STUDIES: Oxygen Saturation is 98% on RA, normal by my interpretation.    COORDINATION OF CARE: 12:07 PM Discussed treatment plan with pt at bedside and pt agreed to plan. 1:54 PM Discussed case with dr Arnoldo Morale, surgery He recommends IV antibiotics and admit to medical service and plan for surgery within 48 hours Pt updated on plan 2:15 PM D/w dr Wyline Copas, admit to medical service  Labs Review Labs Reviewed  CBC WITH DIFFERENTIAL/PLATELET - Abnormal; Notable for the following:    WBC 16.8 (*)    Neutro Abs 13.6 (*)    Monocytes Absolute 1.1 (*)    All other components within normal limits  COMPREHENSIVE METABOLIC PANEL - Abnormal; Notable for the following:    Sodium 133 (*)    Glucose, Bld 139 (*)    AST 14 (*)    All other components within normal limits  LIPASE, BLOOD  URINALYSIS, ROUTINE W REFLEX MICROSCOPIC (NOT AT [facility])    Imaging Review US Abdomen Complete  02/27/2016  CLINICAL DATA:  Abdominal pain EXAM: ABDOMEN ULTRASOUND COMPLETE COMPARISON:  None. FINDINGS: Gallbladder: Distended gallbladder with cholelithiasis. Gallbladder wall thickening measuring 5.1 mm. Common bile duct: Diameter: 10 mm.  No choledocholithiasis. Liver: No focal lesion identified. Increased hepatic parenchymal echogenicity. IVC: No abnormality visualized. Pancreas: Visualized portion unremarkable. Spleen: Size and appearance within normal limits. Right Kidney: Length: 12.6 cm. Echogenicity within normal limits. No mass or hydronephrosis visualized. Left Kidney: Length: 12.9 cm. Echogenicity within normal limits. No mass or hydronephrosis visualized. Abdominal aorta: No aneurysm visualized. Other findings: None. IMPRESSION: 1. Distended gallbladder with cholelithiasis and gallbladder wall thickening concerning for acute  cholecystitis. 2. Increased hepatic echogenicity as can be seen with hepatic steatosis. Electronically Signed   By: Kathreen Devoid   On: 02/27/2016 13:19   I have personally reviewed and evaluated these images and lab results as part of my medical decision-making.   EKG Interpretation   Date/Time:  Wednesday Feb 27 2016 13:11:16 EDT Ventricular Rate:  80 PR Interval:  187 QRS Duration: 96 QT Interval:  394 QTC Calculation: 454 R Axis:   81 Text Interpretation:  Sinus rhythm Borderline right axis deviation  Abnormal R-wave progression, early transition No significant change since  last tracing Confirmed by Christy Gentles  MD, Bryanna Yim ([id number]) on 02/27/2016  1:28:46 PM     Medications  ondansetron (ZOFRAN) injection 4 mg (4 mg Intravenous Given 02/27/16 1123)  morphine 4 MG/ML injection 4 mg (4 mg Intravenous Given 02/27/16 1131)  HYDROmorphone (DILAUDID) injection 1 mg (1 mg Intravenous Given 02/27/16 1212)  piperacillin-tazobactam (ZOSYN) IVPB 3.375 g (3.375 g Intravenous New Bag/Given 02/27/16 1340)  HYDROmorphone (DILAUDID) injection 1 mg (1 mg Intravenous Given 02/27/16 1345)    MDM   Final diagnoses:  Acute cholecystitis    Nursing notes including past medical history and social history reviewed and considered in documentation xrays/imaging reviewed by  myself and considered during evaluation Labs/vital reviewed myself and considered during evaluation   I personally performed the services described in this documentation, which was scribed in my presence. The recorded information has been reviewed and is accurate.       Ripley Fraise, MD 02/27/16 1415

## 2016-02-27 NOTE — ED Notes (Signed)
Belongings sent home with husband.

## 2016-02-28 LAB — COMPREHENSIVE METABOLIC PANEL
ALT: 17 U/L (ref 14–54)
AST: 22 U/L (ref 15–41)
Albumin: 3.2 g/dL — ABNORMAL LOW (ref 3.5–5.0)
Alkaline Phosphatase: 85 U/L (ref 38–126)
Anion gap: 8 (ref 5–15)
BUN: 18 mg/dL (ref 6–20)
CO2: 24 mmol/L (ref 22–32)
Calcium: 8.4 mg/dL — ABNORMAL LOW (ref 8.9–10.3)
Chloride: 101 mmol/L (ref 101–111)
Creatinine, Ser: 0.92 mg/dL (ref 0.44–1.00)
GFR calc Af Amer: >60 mL/min (ref >60)
GFR calc non Af Amer: >60 mL/min (ref >60)
Glucose, Bld: 114 mg/dL — ABNORMAL HIGH (ref 65–99)
Potassium: 3.9 mmol/L (ref 3.5–5.1)
Sodium: 133 mmol/L — ABNORMAL LOW (ref 135–145)
Total Bilirubin: 1.2 mg/dL (ref 0.3–1.2)
Total Protein: 6.7 g/dL (ref 6.5–8.1)

## 2016-02-28 LAB — URINALYSIS, ROUTINE W REFLEX MICROSCOPIC
Bilirubin Urine: NEGATIVE
Glucose, UA: NEGATIVE mg/dL
Hgb urine dipstick: NEGATIVE
Ketones, ur: NEGATIVE mg/dL
Leukocytes, UA: NEGATIVE
Nitrite: NEGATIVE
Protein, ur: NEGATIVE mg/dL
Specific Gravity, Urine: 1.025 (ref 1.005–1.030)
pH: 5.5 (ref 5.0–8.0)

## 2016-02-28 LAB — CBC
HCT: 41.5 % (ref 36.0–46.0)
Hemoglobin: 13.3 g/dL (ref 12.0–15.0)
MCH: 28.6 pg (ref 26.0–34.0)
MCHC: 32 g/dL (ref 30.0–36.0)
MCV: 89.2 fL (ref 78.0–100.0)
Platelets: 262 10*3/uL (ref 150–400)
RBC: 4.65 MIL/uL (ref 3.87–5.11)
RDW: 14.1 % (ref 11.5–15.5)
WBC: 20.1 10*3/uL — ABNORMAL HIGH (ref 4.0–10.5)

## 2016-02-28 LAB — SURGICAL PCR SCREEN
MRSA, PCR: NEGATIVE
Staphylococcus aureus: NEGATIVE

## 2016-02-28 MED ORDER — CHLORHEXIDINE GLUCONATE 4 % EX LIQD
1.0000 "application " | Freq: Once | CUTANEOUS | Status: AC
  Administered 2016-02-28: 1 via TOPICAL
  Filled 2016-02-28: qty 30

## 2016-02-28 MED ORDER — GUAIFENESIN-DM 100-10 MG/5ML PO SYRP
5.0000 mL | ORAL_SOLUTION | ORAL | Status: DC | PRN
  Administered 2016-02-28 – 2016-03-01 (×2): 5 mL via ORAL
  Filled 2016-02-28 (×2): qty 5

## 2016-02-28 NOTE — Consult Note (Signed)
Reason for Consult: Cholecystitis, cholelithiasis Referring Physician: Dr. Asencion Noble  [name] is an 65 y.o. female.  HPI: Patient is a 65 year old white female who presents with several day history of worsening right upper quadrant abdominal pain, nausea, and decreased appetite. She states that this is her first episode that has been this severe. Her pain started in the right upper quadrant and radiates across her abdomen and also around the right flank to the back. She is nauseated and has had an episode of emesis. She denies any fever, chills, or jaundice. She states in the past that she has had episodes of epigastric pain, but they have not been like this. She was found on ultrasound to have cholecystitis with cholelithiasis. She is admitted to the hospital for further evaluation treatment. She states her pain is somewhat less, but she is still having right upper quadrant abdominal pain.  Past Medical History  Diagnosis Date  . HTN (hypertension)   . Hyperlipidemia     Past Surgical History  Procedure Laterality Date  . Colonoscopy  04/13/2006    [id number] colon without evidence of polyps, masses, inflammatory changes diverticular or vascular ectasia/Normal retroflex view of the rectum  . Replacement total knee Bilateral   . Breast reduction surgery      Family History  Problem Relation Age of Onset  . Congestive Heart Failure Mother   . Diabetes Mother   . Diabetes Father   . Hypertension Mother   . Osteoarthritis Father     Social History:  reports that she has never smoked. She does not have any smokeless tobacco history on file. She reports that she does not drink alcohol or use illicit drugs.  Allergies: No Known Allergies  Medications:  Prior to Admission:  Prescriptions prior to admission  Medication Sig Dispense Refill Last Dose  . aspirin 325 MG tablet Take 325 mg by mouth daily.   02/26/2016 at Unknown time  . Calcium-Magnesium-Zinc 1000-400-15 MG TABS Take 1  tablet by mouth daily.     02/26/2016 at Unknown time  . omeprazole (PRILOSEC) 20 MG capsule Take 20 mg by mouth daily. 3-4 x week   Past Week at Unknown time  . oxybutynin (DITROPAN) 5 MG tablet Take 5 mg by mouth 2 (two) times daily.    02/26/2016 at Unknown time  . ramipril (ALTACE) 2.5 MG capsule Take 2.5 mg by mouth daily.     02/26/2016 at Unknown time  . simvastatin (ZOCOR) 40 MG tablet Take 40 mg by mouth at bedtime.     02/26/2016 at Unknown time   Scheduled: . ampicillin-sulbactam (UNASYN) IV  1.5 g Intravenous Q6H  . heparin  5,000 Units Subcutaneous Q8H  . oxybutynin  5 mg Oral BID  . pantoprazole  40 mg Oral Daily  . ramipril  2.5 mg Oral Daily  . simvastatin  40 mg Oral QHS    Results for orders placed or performed during the hospital encounter of 02/27/16 (from the past 48 hour(s))  CBC with Differential     Status: Abnormal   Collection Time: 02/27/16 11:10 AM  Result Value Ref Range   WBC 16.8 (H) 4.0 - 10.5 K/uL   RBC 5.04 3.87 - 5.11 MIL/uL   Hemoglobin 14.7 12.0 - 15.0 g/dL   HCT 44.2 36.0 - 46.0 %   MCV 87.7 78.0 - 100.0 fL   MCH 29.2 26.0 - 34.0 pg   MCHC 33.3 30.0 - 36.0 g/dL   RDW 13.7 11.5 - 15.5 %  Platelets 312 150 - 400 K/uL   Neutrophils Relative % 82 %   Neutro Abs 13.6 (H) 1.7 - 7.7 K/uL   Lymphocytes Relative 12 %   Lymphs Abs 2.1 0.7 - 4.0 K/uL   Monocytes Relative 6 %   Monocytes Absolute 1.1 (H) 0.1 - 1.0 K/uL   Eosinophils Relative 0 %   Eosinophils Absolute 0.0 0.0 - 0.7 K/uL   Basophils Relative 0 %   Basophils Absolute 0.0 0.0 - 0.1 K/uL  Comprehensive metabolic panel     Status: Abnormal   Collection Time: 02/27/16 11:10 AM  Result Value Ref Range   Sodium 133 (L) 135 - 145 mmol/L   Potassium 3.9 3.5 - 5.1 mmol/L   Chloride 101 101 - 111 mmol/L   CO2 25 22 - 32 mmol/L   Glucose, Bld 139 (H) 65 - 99 mg/dL   BUN 11 6 - 20 mg/dL   Creatinine, Ser 0.67 0.44 - 1.00 mg/dL   Calcium 9.2 8.9 - 10.3 mg/dL   Total Protein 7.8 6.5 - 8.1 g/dL    Albumin 4.0 3.5 - 5.0 g/dL   AST 14 (L) 15 - 41 U/L   ALT 15 14 - 54 U/L   Alkaline Phosphatase 104 38 - 126 U/L   Total Bilirubin 0.8 0.3 - 1.2 mg/dL   GFR calc non Af Amer >60 >60 mL/min   GFR calc Af Amer >60 >60 mL/min    Comment: (NOTE) The eGFR has been calculated using the CKD EPI equation. This calculation has not been validated in all clinical situations. eGFR's persistently <60 mL/min signify possible Chronic Kidney Disease.    Anion gap 7 5 - 15  Lipase, blood     Status: None   Collection Time: 02/27/16 11:33 AM  Result Value Ref Range   Lipase 15 11 - 51 U/L  Comprehensive metabolic panel     Status: Abnormal   Collection Time: 02/28/16  4:58 AM  Result Value Ref Range   Sodium 133 (L) 135 - 145 mmol/L   Potassium 3.9 3.5 - 5.1 mmol/L   Chloride 101 101 - 111 mmol/L   CO2 24 22 - 32 mmol/L   Glucose, Bld 114 (H) 65 - 99 mg/dL   BUN 18 6 - 20 mg/dL   Creatinine, Ser 0.92 0.44 - 1.00 mg/dL   Calcium 8.4 (L) 8.9 - 10.3 mg/dL   Total Protein 6.7 6.5 - 8.1 g/dL   Albumin 3.2 (L) 3.5 - 5.0 g/dL   AST 22 15 - 41 U/L   ALT 17 14 - 54 U/L   Alkaline Phosphatase 85 38 - 126 U/L   Total Bilirubin 1.2 0.3 - 1.2 mg/dL   GFR calc non Af Amer >60 >60 mL/min   GFR calc Af Amer >60 >60 mL/min    Comment: (NOTE) The eGFR has been calculated using the CKD EPI equation. This calculation has not been validated in all clinical situations. eGFR's persistently <60 mL/min signify possible Chronic Kidney Disease.    Anion gap 8 5 - 15  CBC     Status: Abnormal   Collection Time: 02/28/16  4:58 AM  Result Value Ref Range   WBC 20.1 (H) 4.0 - 10.5 K/uL   RBC 4.65 3.87 - 5.11 MIL/uL   Hemoglobin 13.3 12.0 - 15.0 g/dL   HCT 41.5 36.0 - 46.0 %   MCV 89.2 78.0 - 100.0 fL   MCH 28.6 26.0 - 34.0 pg   MCHC 32.0 30.0 -  36.0 g/dL   RDW 14.1 11.5 - 15.5 %   Platelets 262 150 - 400 K/uL    US Abdomen Complete  02/27/2016  CLINICAL DATA:  Abdominal pain EXAM: ABDOMEN ULTRASOUND  COMPLETE COMPARISON:  None. FINDINGS: Gallbladder: Distended gallbladder with cholelithiasis. Gallbladder wall thickening measuring 5.1 mm. Common bile duct: Diameter: 10 mm.  No choledocholithiasis. Liver: No focal lesion identified. Increased hepatic parenchymal echogenicity. IVC: No abnormality visualized. Pancreas: Visualized portion unremarkable. Spleen: Size and appearance within normal limits. Right Kidney: Length: 12.6 cm. Echogenicity within normal limits. No mass or hydronephrosis visualized. Left Kidney: Length: 12.9 cm. Echogenicity within normal limits. No mass or hydronephrosis visualized. Abdominal aorta: No aneurysm visualized. Other findings: None. IMPRESSION: 1. Distended gallbladder with cholelithiasis and gallbladder wall thickening concerning for acute cholecystitis. 2. Increased hepatic echogenicity as can be seen with hepatic steatosis. Electronically Signed   By: Kathreen Devoid   On: 02/27/2016 13:19   Dg Chest Portable 1 View  02/27/2016  CLINICAL DATA:  Right upper quadrant pain, nausea, vomiting EXAM: PORTABLE CHEST 1 VIEW COMPARISON:  11/14/2013 FINDINGS: The heart size and mediastinal contours are within normal limits. Both lungs are clear. The visualized skeletal structures are unremarkable. IMPRESSION: No active disease. Electronically Signed   By: Kathreen Devoid   On: 02/27/2016 13:59    ROS:  Constitutional: negative Eyes: negative Ears, nose, mouth, throat, and face: negative Respiratory: negative Cardiovascular: negative Gastrointestinal: negative except for abdominal pain, nausea and vomiting Genitourinary:negative  Blood pressure 90/53, pulse 96, temperature 98.5 F (36.9 C), temperature source Oral, resp. rate 18, height '5\' 9"'  (1.753 m), weight 127.007 kg (280 lb), SpO2 95 %. Physical Exam: Well-developed and well-nourished white female in no acute distress. Physical examination reveals normocephalic, atraumatic HEENT examination reveals no scleral icterus. Rest  of the examination is negative. Neck is supple without lymphadenopathy or carotid bruits. Lungs clear auscultation with breath sounds bilaterally. No rales or wheezing are noted. Abdomen is soft with tenderness noted right upper quadrant to palpation. No hepatosplenomegaly, masses, hernias identified. She is not distended. No rigidity is noted. Assessment/Plan: Impression: Cholecystitis, cholelithiasis, acute. Plan: Agree with IV antibiotics as ordered. Will proceed with laparoscopic cholecystectomy tomorrow. The risks and benefits of the procedure including bleeding, infection, hepatobiliary injury, and the possibility of an open procedure were fully explained to the patient, who gave informed consent.  Synai Prettyman A 02/28/2016, 12:50 PM

## 2016-02-28 NOTE — Progress Notes (Signed)
Pt temp 102.8. Notified Dr. Cindie Laroche. Blood cultures ordered.

## 2016-02-28 NOTE — Progress Notes (Signed)
Subjective: She is feeling better. She's had a maximum temperature 99.5. She last vomited yesterday afternoon. She feels significant improvement in her pain with Dilaudid.  Objective: Vital signs in last 24 hours: Filed Vitals:   02/27/16 1619 02/27/16 2010 02/27/16 2131 02/28/16 0522  BP: 158/74  126/72 90/53  Pulse: 87  106 96  Temp: 98.4 F (36.9 C)  99.5 F (37.5 C) 98.5 F (36.9 C)  TempSrc: Oral  Oral Oral  Resp: 18  20 18   Height: 5\' 9"  (1.753 m)     Weight:      SpO2: 100% 94% 97% 95%   Weight change:   Intake/Output Summary (Last 24 hours) at 02/28/16 0739 Last data filed at 02/27/16 2222  Gross per 24 hour  Intake    240 ml  Output    300 ml  Net    -60 ml    Physical Exam: Alert. No distress. No scleral icterus. Lungs clear. Heart regular with no murmurs. Abdomen soft but tender in the epigastrium and right upper quadrant. No hepatosplenomegaly.  Lab Results:    Results for orders placed or performed during the hospital encounter of 02/27/16 (from the past 24 hour(s))  CBC with Differential     Status: Abnormal   Collection Time: 02/27/16 11:10 AM  Result Value Ref Range   WBC 16.8 (H) 4.0 - 10.5 K/uL   RBC 5.04 3.87 - 5.11 MIL/uL   Hemoglobin 14.7 12.0 - 15.0 g/dL   HCT 44.2 36.0 - 46.0 %   MCV 87.7 78.0 - 100.0 fL   MCH 29.2 26.0 - 34.0 pg   MCHC 33.3 30.0 - 36.0 g/dL   RDW 13.7 11.5 - 15.5 %   Platelets 312 150 - 400 K/uL   Neutrophils Relative % 82 %   Neutro Abs 13.6 (H) 1.7 - 7.7 K/uL   Lymphocytes Relative 12 %   Lymphs Abs 2.1 0.7 - 4.0 K/uL   Monocytes Relative 6 %   Monocytes Absolute 1.1 (H) 0.1 - 1.0 K/uL   Eosinophils Relative 0 %   Eosinophils Absolute 0.0 0.0 - 0.7 K/uL   Basophils Relative 0 %   Basophils Absolute 0.0 0.0 - 0.1 K/uL  Comprehensive metabolic panel     Status: Abnormal   Collection Time: 02/27/16 11:10 AM  Result Value Ref Range   Sodium 133 (L) 135 - 145 mmol/L   Potassium 3.9 3.5 - 5.1 mmol/L   Chloride 101 101  - 111 mmol/L   CO2 25 22 - 32 mmol/L   Glucose, Bld 139 (H) 65 - 99 mg/dL   BUN 11 6 - 20 mg/dL   Creatinine, Ser 0.67 0.44 - 1.00 mg/dL   Calcium 9.2 8.9 - 10.3 mg/dL   Total Protein 7.8 6.5 - 8.1 g/dL   Albumin 4.0 3.5 - 5.0 g/dL   AST 14 (L) 15 - 41 U/L   ALT 15 14 - 54 U/L   Alkaline Phosphatase 104 38 - 126 U/L   Total Bilirubin 0.8 0.3 - 1.2 mg/dL   GFR calc non Af Amer >60 >60 mL/min   GFR calc Af Amer >60 >60 mL/min   Anion gap 7 5 - 15  Lipase, blood     Status: None   Collection Time: 02/27/16 11:33 AM  Result Value Ref Range   Lipase 15 11 - 51 U/L  Comprehensive metabolic panel     Status: Abnormal   Collection Time: 02/28/16  4:58 AM  Result Value Ref Range  Sodium 133 (L) 135 - 145 mmol/L   Potassium 3.9 3.5 - 5.1 mmol/L   Chloride 101 101 - 111 mmol/L   CO2 24 22 - 32 mmol/L   Glucose, Bld 114 (H) 65 - 99 mg/dL   BUN 18 6 - 20 mg/dL   Creatinine, Ser 0.92 0.44 - 1.00 mg/dL   Calcium 8.4 (L) 8.9 - 10.3 mg/dL   Total Protein 6.7 6.5 - 8.1 g/dL   Albumin 3.2 (L) 3.5 - 5.0 g/dL   AST 22 15 - 41 U/L   ALT 17 14 - 54 U/L   Alkaline Phosphatase 85 38 - 126 U/L   Total Bilirubin 1.2 0.3 - 1.2 mg/dL   GFR calc non Af Amer >60 >60 mL/min   GFR calc Af Amer >60 >60 mL/min   Anion gap 8 5 - 15  CBC     Status: Abnormal   Collection Time: 02/28/16  4:58 AM  Result Value Ref Range   WBC 20.1 (H) 4.0 - 10.5 K/uL   RBC 4.65 3.87 - 5.11 MIL/uL   Hemoglobin 13.3 12.0 - 15.0 g/dL   HCT 41.5 36.0 - 46.0 %   MCV 89.2 78.0 - 100.0 fL   MCH 28.6 26.0 - 34.0 pg   MCHC 32.0 30.0 - 36.0 g/dL   RDW 14.1 11.5 - 15.5 %   Platelets 262 150 - 400 K/uL     ABGS No results for input(s): PHART, PO2ART, TCO2, HCO3 in the last 72 hours.  Invalid input(s): PCO2 CULTURES No results found for this or any previous visit (from the past 240 hour(s)). Studies/Results: US Abdomen Complete  02/27/2016  CLINICAL DATA:  Abdominal pain EXAM: ABDOMEN ULTRASOUND COMPLETE COMPARISON:   None. FINDINGS: Gallbladder: Distended gallbladder with cholelithiasis. Gallbladder wall thickening measuring 5.1 mm. Common bile duct: Diameter: 10 mm.  No choledocholithiasis. Liver: No focal lesion identified. Increased hepatic parenchymal echogenicity. IVC: No abnormality visualized. Pancreas: Visualized portion unremarkable. Spleen: Size and appearance within normal limits. Right Kidney: Length: 12.6 cm. Echogenicity within normal limits. No mass or hydronephrosis visualized. Left Kidney: Length: 12.9 cm. Echogenicity within normal limits. No mass or hydronephrosis visualized. Abdominal aorta: No aneurysm visualized. Other findings: None. IMPRESSION: 1. Distended gallbladder with cholelithiasis and gallbladder wall thickening concerning for acute cholecystitis. 2. Increased hepatic echogenicity as can be seen with hepatic steatosis. Electronically Signed   By: Kathreen Devoid   On: 02/27/2016 13:19   Dg Chest Portable 1 View  02/27/2016  CLINICAL DATA:  Right upper quadrant pain, nausea, vomiting EXAM: PORTABLE CHEST 1 VIEW COMPARISON:  11/14/2013 FINDINGS: The heart size and mediastinal contours are within normal limits. Both lungs are clear. The visualized skeletal structures are unremarkable. IMPRESSION: No active disease. Electronically Signed   By: Kathreen Devoid   On: 02/27/2016 13:59   Micro Results: No results found for this or any previous visit (from the past 240 hour(s)). Studies/Results: US Abdomen Complete  02/27/2016  CLINICAL DATA:  Abdominal pain EXAM: ABDOMEN ULTRASOUND COMPLETE COMPARISON:  None. FINDINGS: Gallbladder: Distended gallbladder with cholelithiasis. Gallbladder wall thickening measuring 5.1 mm. Common bile duct: Diameter: 10 mm.  No choledocholithiasis. Liver: No focal lesion identified. Increased hepatic parenchymal echogenicity. IVC: No abnormality visualized. Pancreas: Visualized portion unremarkable. Spleen: Size and appearance within normal limits. Right Kidney: Length:  12.6 cm. Echogenicity within normal limits. No mass or hydronephrosis visualized. Left Kidney: Length: 12.9 cm. Echogenicity within normal limits. No mass or hydronephrosis visualized. Abdominal aorta: No aneurysm visualized. Other findings: None.  IMPRESSION: 1. Distended gallbladder with cholelithiasis and gallbladder wall thickening concerning for acute cholecystitis. 2. Increased hepatic echogenicity as can be seen with hepatic steatosis. Electronically Signed   By: Kathreen Devoid   On: 02/27/2016 13:19   Dg Chest Portable 1 View  02/27/2016  CLINICAL DATA:  Right upper quadrant pain, nausea, vomiting EXAM: PORTABLE CHEST 1 VIEW COMPARISON:  11/14/2013 FINDINGS: The heart size and mediastinal contours are within normal limits. Both lungs are clear. The visualized skeletal structures are unremarkable. IMPRESSION: No active disease. Electronically Signed   By: Kathreen Devoid   On: 02/27/2016 13:59   Medications:  I have reviewed the patient's current medications Scheduled Meds: . ampicillin-sulbactam (UNASYN) IV  1.5 g Intravenous Q6H  . heparin  5,000 Units Subcutaneous Q8H  . oxybutynin  5 mg Oral BID  . pantoprazole  40 mg Oral Daily  . ramipril  2.5 mg Oral Daily  . simvastatin  40 mg Oral QHS   Continuous Infusions: . sodium chloride 75 mL/hr at 02/28/16 0539   PRN Meds:.acetaminophen **OR** acetaminophen, HYDROmorphone (DILAUDID) injection, morphine injection, ondansetron (ZOFRAN) IV   Assessment/Plan: #1. Acute cholecystitis. White count is increased to 20.1. Ultrasound findings noted. Case discussed with general surgery. Plan laparoscopic cholecystectomy tomorrow morning.  Continue Unasyn. Continue Dilaudid as needed. #2. Hypertension. #3. Hyperlipidemia. Principal Problem:   Acute cholecystitis Active Problems:   Hypertension   Dyslipidemia     LOS: 1 day   [name] 02/28/2016, 7:39 AM

## 2016-02-29 ENCOUNTER — Inpatient Hospital Stay (HOSPITAL_COMMUNITY): Payer: BLUE CROSS/BLUE SHIELD

## 2016-02-29 ENCOUNTER — Inpatient Hospital Stay (HOSPITAL_COMMUNITY): Payer: BLUE CROSS/BLUE SHIELD | Admitting: Anesthesiology

## 2016-02-29 ENCOUNTER — Encounter (HOSPITAL_COMMUNITY)
Admission: EM | Disposition: A | Discharge status: Home / Self Care | Payer: Self-pay | Source: Home / Self Care | Attending: Internal Medicine

## 2016-02-29 ENCOUNTER — Encounter (HOSPITAL_COMMUNITY): Payer: Self-pay | Admitting: *Deleted

## 2016-02-29 HISTORY — PX: CHOLECYSTECTOMY: SHX55

## 2016-02-29 LAB — LACTIC ACID, PLASMA: Lactic Acid, Venous: 0.7 mmol/L (ref 0.5–2.0)

## 2016-02-29 SURGERY — LAPAROSCOPIC CHOLECYSTECTOMY
Anesthesia: General | Site: Abdomen

## 2016-02-29 MED ORDER — ONDANSETRON HCL 4 MG/2ML IJ SOLN: 4.0000 mg | Freq: Once | INTRAMUSCULAR | Status: DC | PRN

## 2016-02-29 MED ORDER — GLYCOPYRROLATE 0.2 MG/ML IJ SOLN
INTRAMUSCULAR | Status: AC
  Filled 2016-02-29: qty 2

## 2016-02-29 MED ORDER — BUPIVACAINE HCL (PF) 0.5 % IJ SOLN
INTRAMUSCULAR | Status: AC
  Filled 2016-02-29: qty 30

## 2016-02-29 MED ORDER — SUCCINYLCHOLINE CHLORIDE 20 MG/ML IJ SOLN
INTRAMUSCULAR | Status: DC | PRN
  Administered 2016-02-29: 140 mg via INTRAVENOUS

## 2016-02-29 MED ORDER — LACTATED RINGERS IV SOLN
INTRAVENOUS | Status: DC
  Administered 2016-02-29 (×2): via INTRAVENOUS

## 2016-02-29 MED ORDER — FENTANYL CITRATE (PF) 100 MCG/2ML IJ SOLN
25.0000 ug | INTRAMUSCULAR | Status: DC | PRN
  Administered 2016-02-29 (×3): 25 ug via INTRAVENOUS
  Filled 2016-02-29: qty 2

## 2016-02-29 MED ORDER — POVIDONE-IODINE 10 % OINT PACKET
TOPICAL_OINTMENT | CUTANEOUS | Status: DC | PRN
  Administered 2016-02-29: 1 via TOPICAL

## 2016-02-29 MED ORDER — FENTANYL CITRATE (PF) 100 MCG/2ML IJ SOLN
INTRAMUSCULAR | Status: DC | PRN
  Administered 2016-02-29: 50 ug via INTRAVENOUS

## 2016-02-29 MED ORDER — ONDANSETRON HCL 4 MG/2ML IJ SOLN
INTRAMUSCULAR | Status: AC
  Filled 2016-02-29: qty 2

## 2016-02-29 MED ORDER — EPHEDRINE SULFATE 50 MG/ML IJ SOLN
INTRAMUSCULAR | Status: AC
  Filled 2016-02-29: qty 1

## 2016-02-29 MED ORDER — OXYCODONE-ACETAMINOPHEN 5-325 MG PO TABS
1.0000 | ORAL_TABLET | ORAL | Status: DC | PRN
  Administered 2016-02-29: 1 via ORAL
  Filled 2016-02-29: qty 1

## 2016-02-29 MED ORDER — NEOSTIGMINE METHYLSULFATE 10 MG/10ML IV SOLN
INTRAVENOUS | Status: AC
  Filled 2016-02-29: qty 1

## 2016-02-29 MED ORDER — SUCCINYLCHOLINE CHLORIDE 20 MG/ML IJ SOLN
INTRAMUSCULAR | Status: AC
  Filled 2016-02-29: qty 1

## 2016-02-29 MED ORDER — GLYCOPYRROLATE 0.2 MG/ML IJ SOLN
0.2000 mg | Freq: Once | INTRAMUSCULAR | Status: AC
  Administered 2016-02-29: 0.2 mg via INTRAVENOUS

## 2016-02-29 MED ORDER — FENTANYL CITRATE (PF) 100 MCG/2ML IJ SOLN
INTRAMUSCULAR | Status: AC
  Filled 2016-02-29: qty 2

## 2016-02-29 MED ORDER — PROPOFOL 10 MG/ML IV BOLUS
INTRAVENOUS | Status: DC | PRN
  Administered 2016-02-29: 200 mg via INTRAVENOUS

## 2016-02-29 MED ORDER — BUPIVACAINE HCL (PF) 0.5 % IJ SOLN
INTRAMUSCULAR | Status: DC | PRN
  Administered 2016-02-29: 10 mL

## 2016-02-29 MED ORDER — SIMETHICONE 80 MG PO CHEW: 40.0000 mg | CHEWABLE_TABLET | Freq: Four times a day (QID) | ORAL | Status: DC | PRN

## 2016-02-29 MED ORDER — MIDAZOLAM HCL 2 MG/2ML IJ SOLN
1.0000 mg | INTRAMUSCULAR | Status: DC | PRN
  Administered 2016-02-29: 2 mg via INTRAVENOUS

## 2016-02-29 MED ORDER — DIPHENHYDRAMINE HCL 50 MG/ML IJ SOLN: 12.5000 mg | Freq: Four times a day (QID) | INTRAMUSCULAR | Status: DC | PRN

## 2016-02-29 MED ORDER — ONDANSETRON 4 MG PO TBDP
4.0000 mg | ORAL_TABLET | Freq: Four times a day (QID) | ORAL | Status: DC | PRN
  Filled 2016-02-29: qty 1

## 2016-02-29 MED ORDER — GLYCOPYRROLATE 0.2 MG/ML IJ SOLN
INTRAMUSCULAR | Status: AC
  Filled 2016-02-29: qty 1

## 2016-02-29 MED ORDER — MIDAZOLAM HCL 2 MG/2ML IJ SOLN
INTRAMUSCULAR | Status: AC
  Filled 2016-02-29: qty 2

## 2016-02-29 MED ORDER — NEOSTIGMINE METHYLSULFATE 10 MG/10ML IV SOLN
INTRAVENOUS | Status: DC | PRN
  Administered 2016-02-29: 4 mg via INTRAVENOUS

## 2016-02-29 MED ORDER — DIPHENHYDRAMINE HCL 12.5 MG/5ML PO ELIX: 12.5000 mg | ORAL_SOLUTION | Freq: Four times a day (QID) | ORAL | Status: DC | PRN

## 2016-02-29 MED ORDER — 0.9 % SODIUM CHLORIDE (POUR BTL) OPTIME
TOPICAL | Status: DC | PRN
  Administered 2016-02-29: 1000 mL

## 2016-02-29 MED ORDER — PHENYLEPHRINE HCL 10 MG/ML IJ SOLN
INTRAMUSCULAR | Status: AC
  Filled 2016-02-29: qty 1

## 2016-02-29 MED ORDER — HYDROMORPHONE HCL 1 MG/ML IJ SOLN
1.0000 mg | INTRAMUSCULAR | Status: DC | PRN
  Administered 2016-02-29 – 2016-03-01 (×5): 1 mg via INTRAVENOUS
  Filled 2016-02-29 (×5): qty 1

## 2016-02-29 MED ORDER — LIDOCAINE HCL (CARDIAC) 20 MG/ML IV SOLN
INTRAVENOUS | Status: DC | PRN
  Administered 2016-02-29: 50 mg via INTRAVENOUS

## 2016-02-29 MED ORDER — POVIDONE-IODINE 10 % EX OINT
TOPICAL_OINTMENT | CUTANEOUS | Status: AC
  Filled 2016-02-29: qty 1

## 2016-02-29 MED ORDER — ROCURONIUM BROMIDE 50 MG/5ML IV SOLN
INTRAVENOUS | Status: AC
  Filled 2016-02-29: qty 1

## 2016-02-29 MED ORDER — LORAZEPAM 2 MG/ML IJ SOLN: 1.0000 mg | INTRAMUSCULAR | Status: DC | PRN

## 2016-02-29 MED ORDER — KETOROLAC TROMETHAMINE 30 MG/ML IJ SOLN
30.0000 mg | Freq: Once | INTRAMUSCULAR | Status: AC
  Administered 2016-02-29: 30 mg via INTRAVENOUS

## 2016-02-29 MED ORDER — HEMOSTATIC AGENTS (NO CHARGE) OPTIME
TOPICAL | Status: DC | PRN
  Administered 2016-02-29: 1 via TOPICAL

## 2016-02-29 MED ORDER — GLYCOPYRROLATE 0.2 MG/ML IJ SOLN
INTRAMUSCULAR | Status: DC | PRN
  Administered 2016-02-29: 0.4 mg via INTRAVENOUS

## 2016-02-29 MED ORDER — KETOROLAC TROMETHAMINE 30 MG/ML IJ SOLN
INTRAMUSCULAR | Status: AC
  Filled 2016-02-29: qty 1

## 2016-02-29 MED ORDER — LIDOCAINE HCL (PF) 1 % IJ SOLN
INTRAMUSCULAR | Status: AC
  Filled 2016-02-29: qty 5

## 2016-02-29 MED ORDER — ENOXAPARIN SODIUM 40 MG/0.4ML ~~LOC~~ SOLN
40.0000 mg | SUBCUTANEOUS | Status: DC
  Administered 2016-03-01: 40 mg via SUBCUTANEOUS
  Filled 2016-02-29: qty 0.4

## 2016-02-29 MED ORDER — PROPOFOL 10 MG/ML IV BOLUS
INTRAVENOUS | Status: AC
  Filled 2016-02-29: qty 40

## 2016-02-29 MED ORDER — ROCURONIUM BROMIDE 100 MG/10ML IV SOLN
INTRAVENOUS | Status: DC | PRN
  Administered 2016-02-29: 40 mg via INTRAVENOUS

## 2016-02-29 MED ORDER — SODIUM CHLORIDE 0.9 % IR SOLN
Status: DC | PRN
  Administered 2016-02-29: 3000 mL

## 2016-02-29 MED ORDER — ONDANSETRON HCL 4 MG/2ML IJ SOLN
4.0000 mg | Freq: Four times a day (QID) | INTRAMUSCULAR | Status: DC | PRN
  Administered 2016-02-29: 4 mg via INTRAVENOUS
  Filled 2016-02-29: qty 2

## 2016-02-29 MED ORDER — ONDANSETRON HCL 4 MG/2ML IJ SOLN
4.0000 mg | Freq: Once | INTRAMUSCULAR | Status: AC
  Administered 2016-02-29: 4 mg via INTRAVENOUS

## 2016-02-29 MED ORDER — SODIUM CHLORIDE 0.9 % IR SOLN: Status: DC | PRN

## 2016-02-29 SURGICAL SUPPLY — 51 items
APPLICATOR ARISTA FLEXITIP XL (MISCELLANEOUS) ×3 IMPLANT
APPLIER CLIP LAPSCP 10X32 DD (CLIP) ×3 IMPLANT
BAG HAMPER (MISCELLANEOUS) ×3 IMPLANT
CHLORAPREP W/TINT 26ML (MISCELLANEOUS) ×3 IMPLANT
CLOTH BEACON ORANGE TIMEOUT ST (SAFETY) ×3 IMPLANT
COVER LIGHT HANDLE STERIS (MISCELLANEOUS) ×6 IMPLANT
CUTTER FLEX LINEAR 45M (STAPLE) ×3 IMPLANT
DECANTER SPIKE VIAL GLASS SM (MISCELLANEOUS) ×3 IMPLANT
ELECT REM PT RETURN 9FT ADLT (ELECTROSURGICAL) ×3
ELECTRODE REM PT RTRN 9FT ADLT (ELECTROSURGICAL) ×1 IMPLANT
FILTER SMOKE EVAC LAPAROSHD (FILTER) ×3 IMPLANT
FORMALIN 10 PREFIL 120ML (MISCELLANEOUS) ×3 IMPLANT
GLOVE BIO SURGEON STRL SZ7 (GLOVE) ×3 IMPLANT
GLOVE BIOGEL PI IND STRL 7.0 (GLOVE) ×1 IMPLANT
GLOVE BIOGEL PI IND STRL 7.5 (GLOVE) ×1 IMPLANT
GLOVE BIOGEL PI INDICATOR 7.0 (GLOVE) ×2
GLOVE BIOGEL PI INDICATOR 7.5 (GLOVE) ×2
GLOVE ECLIPSE 7.0 STRL STRAW (GLOVE) ×3 IMPLANT
GLOVE SKINSENSE NS SZ6.5 (GLOVE) ×2
GLOVE SKINSENSE STRL SZ6.5 (GLOVE) ×1 IMPLANT
GLOVE SURG SS PI 7.5 STRL IVOR (GLOVE) ×3 IMPLANT
GOWN STRL REUS W/ TWL XL LVL3 (GOWN DISPOSABLE) ×1 IMPLANT
GOWN STRL REUS W/TWL LRG LVL3 (GOWN DISPOSABLE) ×6 IMPLANT
GOWN STRL REUS W/TWL XL LVL3 (GOWN DISPOSABLE) ×2
HEMOSTAT ARISTA ABSORB 3G PWDR (MISCELLANEOUS) ×3 IMPLANT
HEMOSTAT SNOW SURGICEL 2X4 (HEMOSTASIS) ×3 IMPLANT
INST SET LAPROSCOPIC AP (KITS) ×3 IMPLANT
IV NS IRRIG 3000ML ARTHROMATIC (IV SOLUTION) ×3 IMPLANT
KIT ROOM TURNOVER APOR (KITS) ×3 IMPLANT
MANIFOLD NEPTUNE II (INSTRUMENTS) ×3 IMPLANT
NEEDLE INSUFFLATION 14GA 120MM (NEEDLE) ×3 IMPLANT
NS IRRIG 1000ML POUR BTL (IV SOLUTION) ×3 IMPLANT
PACK LAP CHOLE LZT030E (CUSTOM PROCEDURE TRAY) ×3 IMPLANT
PAD ARMBOARD 7.5X6 YLW CONV (MISCELLANEOUS) ×3 IMPLANT
PENCIL HANDSWITCHING (ELECTRODE) ×3 IMPLANT
POUCH SPECIMEN RETRIEVAL 10MM (ENDOMECHANICALS) ×3 IMPLANT
RELOAD 45 VASCULAR/THIN (ENDOMECHANICALS) ×3 IMPLANT
SET BASIN LINEN APH (SET/KITS/TRAYS/PACK) ×3 IMPLANT
SET TUBE IRRIG SUCTION NO TIP (IRRIGATION / IRRIGATOR) ×3 IMPLANT
SLEEVE ENDOPATH XCEL 5M (ENDOMECHANICALS) ×3 IMPLANT
SPONGE GAUZE 2X2 8PLY STER LF (GAUZE/BANDAGES/DRESSINGS) ×4
SPONGE GAUZE 2X2 8PLY STRL LF (GAUZE/BANDAGES/DRESSINGS) ×8 IMPLANT
STAPLER VISISTAT (STAPLE) ×3 IMPLANT
SUT VICRYL 0 UR6 27IN ABS (SUTURE) ×6 IMPLANT
TAPE CLOTH SURG 4X10 WHT LF (GAUZE/BANDAGES/DRESSINGS) ×3 IMPLANT
TROCAR ENDO BLADELESS 11MM (ENDOMECHANICALS) ×3 IMPLANT
TROCAR XCEL NON-BLD 5MMX100MML (ENDOMECHANICALS) ×3 IMPLANT
TROCAR XCEL UNIV SLVE 11M 100M (ENDOMECHANICALS) ×3 IMPLANT
TUBING INSUFFLATION (TUBING) ×3 IMPLANT
WARMER LAPAROSCOPE (MISCELLANEOUS) ×3 IMPLANT
YANKAUER SUCT 12FT TUBE ARGYLE (SUCTIONS) ×3 IMPLANT

## 2016-02-29 NOTE — Anesthesia Preprocedure Evaluation (Signed)
Anesthesia Evaluation  Patient identified by MRN, date of birth, ID band Patient awake    Reviewed: Allergy & Precautions, NPO status , Patient's Chart, lab work & pertinent test results  Airway Mallampati: I       Dental  (+) Teeth Intact, Dental Advisory Given   Pulmonary shortness of breath and with exertion,    breath sounds clear to auscultation       Cardiovascular hypertension, Pt. on medications + DOE   Rhythm:Regular Rate:Normal     Neuro/Psych TIA (2004)   GI/Hepatic GERD  ,  Endo/Other    Renal/GU      Musculoskeletal   Abdominal   Peds  Hematology   Anesthesia Other Findings   Reproductive/Obstetrics                             Anesthesia Physical Anesthesia Plan  ASA: III  Anesthesia Plan: General   Post-op Pain Management:    Induction: Intravenous, Rapid sequence and Cricoid pressure planned  Airway Management Planned: Oral ETT  Additional Equipment:   Intra-op Plan:   Post-operative Plan: Extubation in OR  Informed Consent: I have reviewed the patients History and Physical, chart, labs and discussed the procedure including the risks, benefits and alternatives for the proposed anesthesia with the patient or authorized representative who has indicated his/her understanding and acceptance.     Plan Discussed with:   Anesthesia Plan Comments:         Anesthesia Quick Evaluation

## 2016-02-29 NOTE — Op Note (Signed)
Patient:  [name]  DOB:  1950-11-21  MRN:  [id number]   Preop Diagnosis:  Acute cholecystitis, cholelithiasis  Postop Diagnosis:  Same  Procedure:  Laparoscopic cholecystectomy  Surgeon:  Aviva Signs, M.D.  Assistant: Tama High, M.D.  Anes:  Gen. endotracheal  Indications:  Patient is a 65 year old white female who presented emergency room with worsening right upper quadrant abdominal pain. She was diagnosed with acute cholecystitis secondary to cholelithiasis. The patient now presents the operating room for laparoscopic cholecystectomy. The risks and benefits of the procedure including bleeding, infection, hepatobiliary injury, and the possibility of an open procedure were fully explained to the patient, who gave informed consent.  Procedure note:  The patient was placed in the supine position. After induction of general endotracheal anesthesia, the abdomen was prepped and draped using the usual sterile technique with DuraPrep. Surgical site confirmation was performed.  A supraumbilical incision was made down to the fascia. A Veress needle was introduced into the abdominal cavity and confirmation of placement was done using the saline drop test. The abdomen was then insufflated to 16 mmHg pressure. An 11 mm trocar was introduced into the abdominal cavity under direct visualization without difficulty. The patient was placed in reverse Trendelenburg position and an additional 11 mm trocar was placed the epigastric region and 5 mm trochars were placed the right upper quadrant and right flank regions. The liver was inspected and noted within normal limits. The gallbladder was noted be distended, significantly inflamed, with a thickened gallbladder wall. The gallbladder was decompressed in hydrops of the gallbladder was found. The gallbladder was retracted in a dynamic fashion in order to provide a critical view of the triangle of Calot. The cystic duct was first identified. Its juncture  to the infundibulum was fully identified. A vascular Endo GIA was placed across the cystic duct, and the cystic duct was divided. The cystic artery was ligated and divided using clips. The gallbladder was freed away from the gallbladder fossa using Bovie electrocautery. The gallbladder was removed using an Endo Catch bag without difficulty. The subhepatic space was copiously irrigated with normal saline. No abnormal bile leakage noted. Avista and Surgicel were placed into the gallbladder bed. All fluid and air were then evacuated from the abdominal cavity prior to removal of the trochars.  All wounds were irrigated with normal saline. All wounds were injected with 0.5% Sensorcaine. The supraumbilical fascia as well as epigastric fascia were reapproximated using 0 Vicryl interrupted sutures. All skin incisions were closed using staples. Betadine ointment and dry sterile dressings were applied.  All tape and needle counts were correct the end of the procedure. The patient was extubated in the operating room and transferred to PACU in stable condition.  Complications:  None  EBL:  200 mL  Specimen:  Gallbladder

## 2016-02-29 NOTE — Progress Notes (Signed)
Subjective: She continues to experience pain. She had a fever to 102.8 last night. Blood cultures were obtained. She has had no vomiting.  Objective: Vital signs in last 24 hours: Filed Vitals:   02/28/16 2025 02/28/16 2215 02/29/16 0008 02/29/16 0621  BP:  99/59 96/61 121/66  Pulse: 102 98 100 99  Temp: 100.7 F (38.2 C) 99.7 F (37.6 C) 98.7 F (37.1 C) 99.2 F (37.3 C)  TempSrc: Oral Oral Oral Oral  Resp: 17 16  18   Height:      Weight:      SpO2: 95% 95% 94% 98%   Weight change:   Intake/Output Summary (Last 24 hours) at 02/29/16 0741 Last data filed at 02/29/16 0012  Gross per 24 hour  Intake    960 ml  Output    275 ml  Net    685 ml    Physical Exam: Alert. No distress. Lungs clear. Heart regular with no murmurs. Abdomen tender in the right upper quadrant without hepatosplenomegaly palpable.  Lab Results:    Results for orders placed or performed during the hospital encounter of 02/27/16 (from the past 24 hour(s))  Urinalysis, Routine w reflex microscopic (not at [facility])     Status: None   Collection Time: 02/28/16  4:15 PM  Result Value Ref Range   Color, Urine YELLOW YELLOW   APPearance CLEAR CLEAR   Specific Gravity, Urine 1.025 1.005 - 1.030   pH 5.5 5.0 - 8.0   Glucose, UA NEGATIVE NEGATIVE mg/dL   Hgb urine dipstick NEGATIVE NEGATIVE   Bilirubin Urine NEGATIVE NEGATIVE   Ketones, ur NEGATIVE NEGATIVE mg/dL   Protein, ur NEGATIVE NEGATIVE mg/dL   Nitrite NEGATIVE NEGATIVE   Leukocytes, UA NEGATIVE NEGATIVE  Surgical PCR screen     Status: None   Collection Time: 02/28/16  5:25 PM  Result Value Ref Range   MRSA, PCR NEGATIVE NEGATIVE   Staphylococcus aureus NEGATIVE NEGATIVE  Culture, blood (routine x 2)     Status: None (Preliminary result)   Collection Time: 02/28/16  8:11 PM  Result Value Ref Range   Specimen Description RIGHT ANTECUBITAL    Special Requests BOTTLES DRAWN AEROBIC AND ANAEROBIC 6CC    Culture PENDING    Report Status PENDING    Culture, blood (routine x 2)     Status: None (Preliminary result)   Collection Time: 02/28/16  8:35 PM  Result Value Ref Range   Specimen Description BLOOD RIGHT HAND    Special Requests BOTTLES DRAWN AEROBIC AND ANAEROBIC 6CC    Culture PENDING    Report Status PENDING      ABGS No results for input(s): PHART, PO2ART, TCO2, HCO3 in the last 72 hours.  Invalid input(s): PCO2 CULTURES Recent Results (from the past 240 hour(s))  Surgical PCR screen     Status: None   Collection Time: 02/28/16  5:25 PM  Result Value Ref Range Status   MRSA, PCR NEGATIVE NEGATIVE Final   Staphylococcus aureus NEGATIVE NEGATIVE Final    Comment:        The Xpert SA Assay (FDA approved for NASAL specimens in patients over 74 years of age), is one component of a comprehensive surveillance program.  Test performance has been validated by [facility] for patients greater than or equal to 69 year old. It is not intended to diagnose infection nor to guide or monitor treatment.   Culture, blood (routine x 2)     Status: None (Preliminary result)   Collection Time:  02/28/16  8:11 PM  Result Value Ref Range Status   Specimen Description RIGHT ANTECUBITAL  Final   Special Requests BOTTLES DRAWN AEROBIC AND ANAEROBIC 6CC  Final   Culture PENDING  Incomplete   Report Status PENDING  Incomplete  Culture, blood (routine x 2)     Status: None (Preliminary result)   Collection Time: 02/28/16  8:35 PM  Result Value Ref Range Status   Specimen Description BLOOD RIGHT HAND  Final   Special Requests BOTTLES DRAWN AEROBIC AND ANAEROBIC 6CC  Final   Culture PENDING  Incomplete   Report Status PENDING  Incomplete   Studies/Results: US Abdomen Complete  02/27/2016  CLINICAL DATA:  Abdominal pain EXAM: ABDOMEN ULTRASOUND COMPLETE COMPARISON:  None. FINDINGS: Gallbladder: Distended gallbladder with cholelithiasis. Gallbladder wall thickening measuring 5.1 mm. Common bile duct: Diameter: 10 mm.  No  choledocholithiasis. Liver: No focal lesion identified. Increased hepatic parenchymal echogenicity. IVC: No abnormality visualized. Pancreas: Visualized portion unremarkable. Spleen: Size and appearance within normal limits. Right Kidney: Length: 12.6 cm. Echogenicity within normal limits. No mass or hydronephrosis visualized. Left Kidney: Length: 12.9 cm. Echogenicity within normal limits. No mass or hydronephrosis visualized. Abdominal aorta: No aneurysm visualized. Other findings: None. IMPRESSION: 1. Distended gallbladder with cholelithiasis and gallbladder wall thickening concerning for acute cholecystitis. 2. Increased hepatic echogenicity as can be seen with hepatic steatosis. Electronically Signed   By: Kathreen Devoid   On: 02/27/2016 13:19   Dg Chest Portable 1 View  02/27/2016  CLINICAL DATA:  Right upper quadrant pain, nausea, vomiting EXAM: PORTABLE CHEST 1 VIEW COMPARISON:  11/14/2013 FINDINGS: The heart size and mediastinal contours are within normal limits. Both lungs are clear. The visualized skeletal structures are unremarkable. IMPRESSION: No active disease. Electronically Signed   By: Kathreen Devoid   On: 02/27/2016 13:59   Micro Results: Recent Results (from the past 240 hour(s))  Surgical PCR screen     Status: None   Collection Time: 02/28/16  5:25 PM  Result Value Ref Range Status   MRSA, PCR NEGATIVE NEGATIVE Final   Staphylococcus aureus NEGATIVE NEGATIVE Final    Comment:        The Xpert SA Assay (FDA approved for NASAL specimens in patients over 49 years of age), is one component of a comprehensive surveillance program.  Test performance has been validated by [facility] for patients greater than or equal to 36 year old. It is not intended to diagnose infection nor to guide or monitor treatment.   Culture, blood (routine x 2)     Status: None (Preliminary result)   Collection Time: 02/28/16  8:11 PM  Result Value Ref Range Status   Specimen Description RIGHT  ANTECUBITAL  Final   Special Requests BOTTLES DRAWN AEROBIC AND ANAEROBIC 6CC  Final   Culture PENDING  Incomplete   Report Status PENDING  Incomplete  Culture, blood (routine x 2)     Status: None (Preliminary result)   Collection Time: 02/28/16  8:35 PM  Result Value Ref Range Status   Specimen Description BLOOD RIGHT HAND  Final   Special Requests BOTTLES DRAWN AEROBIC AND ANAEROBIC 6CC  Final   Culture PENDING  Incomplete   Report Status PENDING  Incomplete   Studies/Results: US Abdomen Complete  02/27/2016  CLINICAL DATA:  Abdominal pain EXAM: ABDOMEN ULTRASOUND COMPLETE COMPARISON:  None. FINDINGS: Gallbladder: Distended gallbladder with cholelithiasis. Gallbladder wall thickening measuring 5.1 mm. Common bile duct: Diameter: 10 mm.  No choledocholithiasis. Liver: No focal lesion identified. Increased  hepatic parenchymal echogenicity. IVC: No abnormality visualized. Pancreas: Visualized portion unremarkable. Spleen: Size and appearance within normal limits. Right Kidney: Length: 12.6 cm. Echogenicity within normal limits. No mass or hydronephrosis visualized. Left Kidney: Length: 12.9 cm. Echogenicity within normal limits. No mass or hydronephrosis visualized. Abdominal aorta: No aneurysm visualized. Other findings: None. IMPRESSION: 1. Distended gallbladder with cholelithiasis and gallbladder wall thickening concerning for acute cholecystitis. 2. Increased hepatic echogenicity as can be seen with hepatic steatosis. Electronically Signed   By: Kathreen Devoid   On: 02/27/2016 13:19   Dg Chest Portable 1 View  02/27/2016  CLINICAL DATA:  Right upper quadrant pain, nausea, vomiting EXAM: PORTABLE CHEST 1 VIEW COMPARISON:  11/14/2013 FINDINGS: The heart size and mediastinal contours are within normal limits. Both lungs are clear. The visualized skeletal structures are unremarkable. IMPRESSION: No active disease. Electronically Signed   By: Kathreen Devoid   On: 02/27/2016 13:59   Medications:  I have  reviewed the patient's current medications Scheduled Meds: . ampicillin-sulbactam (UNASYN) IV  1.5 g Intravenous Q6H  . heparin  5,000 Units Subcutaneous Q8H  . oxybutynin  5 mg Oral BID  . pantoprazole  40 mg Oral Daily  . ramipril  2.5 mg Oral Daily  . simvastatin  40 mg Oral QHS   Continuous Infusions: . sodium chloride 75 mL/hr at 02/28/16 1836   PRN Meds:.acetaminophen **OR** acetaminophen, guaiFENesin-dextromethorphan, HYDROmorphone (DILAUDID) injection, morphine injection, ondansetron (ZOFRAN) IV   Assessment/Plan: #1. Acute cholecystitis. Laparoscopic cholecystectomy planned for today. Discussed with Dr. Arnoldo Morale. #2. Cough. She has developed a nonproductive cough. Chest x-ray reveals no infiltrate. Principal Problem:   Acute cholecystitis Active Problems:   Hypertension   Dyslipidemia     LOS: 2 days   Trea Carnegie 02/29/2016, 7:41 AM

## 2016-02-29 NOTE — Transfer of Care (Signed)
Immediate Anesthesia Transfer of Care Note  Patient: [name]  Procedure(s) Performed: Procedure(s): LAPAROSCOPIC CHOLECYSTECTOMY (N/A)  Patient Location: PACU  Anesthesia Type:General  Level of Consciousness: sedated  Airway & Oxygen Therapy: Patient Spontanous Breathing and Patient connected to face mask oxygen  Post-op Assessment: Report given to RN and Post -op Vital signs reviewed and stable  Post vital signs: Reviewed and stable  Last Vitals:  Filed Vitals:   02/29/16 0922 02/29/16 0923  BP:  112/63  Pulse: 90   Temp: 37 C   Resp: 18     Last Pain:  Filed Vitals:   02/29/16 0923  PainSc: Asleep      Patients Stated Pain Goal: 2 (0000000 [id number])  Complications: No apparent anesthesia complications

## 2016-02-29 NOTE — Addendum Note (Signed)
Addendum  created 02/29/16 1633 by Mickel Baas, CRNA   Modules edited: Clinical Notes   Clinical Notes:  File: [id number]

## 2016-02-29 NOTE — Anesthesia Postprocedure Evaluation (Signed)
Anesthesia Post Note  Patient: [name]  Procedure(s) Performed: Procedure(s) (LRB): LAPAROSCOPIC CHOLECYSTECTOMY (N/A)  Patient location during evaluation: Nursing Unit Anesthesia Type: General Level of consciousness: awake and alert and oriented Pain management: pain level controlled Vital Signs Assessment: post-procedure vital signs reviewed and stable Respiratory status: spontaneous breathing Cardiovascular status: stable Postop Assessment: no signs of nausea or vomiting Anesthetic complications: no    Last Vitals:  Filed Vitals:   02/29/16 1215 02/29/16 1230  BP: 123/66 126/62  Pulse: 91 90  Temp:  36.8 C  Resp: 25 23    Last Pain:  Filed Vitals:   02/29/16 1447  PainSc: 4                  ADAMS, AMY A

## 2016-02-29 NOTE — Progress Notes (Signed)
Patient ambulated to commode in room void a scant amount. Tolerated ambulating well.

## 2016-02-29 NOTE — Anesthesia Procedure Notes (Signed)
Procedure Name: Intubation Date/Time: 02/29/2016 9:56 AM Performed by: Lieutenant Diego Pre-anesthesia Checklist: Patient identified, Emergency Drugs available, Suction available and Patient being monitored Patient Re-evaluated:Patient Re-evaluated prior to inductionOxygen Delivery Method: Circle System Utilized Preoxygenation: Pre-oxygenation with 100% oxygen Intubation Type: IV induction Ventilation: Mask ventilation without difficulty Laryngoscope Size: Miller and 2 Grade View: Grade I Tube type: Oral Tube size: 7.0 mm Number of attempts: 1 Airway Equipment and Method: Stylet and Oral airway Placement Confirmation: ETT inserted through vocal cords under direct vision,  positive ETCO2 and breath sounds checked- equal and bilateral Secured at: 24 cm Tube secured with: Tape Dental Injury: Teeth and Oropharynx as per pre-operative assessment

## 2016-02-29 NOTE — Anesthesia Postprocedure Evaluation (Signed)
Anesthesia Post Note  Patient: [name]  Procedure(s) Performed: Procedure(s) (LRB): LAPAROSCOPIC CHOLECYSTECTOMY (N/A)  Patient location during evaluation: ICU Anesthesia Type: General Level of consciousness: awake and alert and oriented Pain management: pain level controlled Vital Signs Assessment: post-procedure vital signs reviewed and stable Respiratory status: spontaneous breathing Cardiovascular status: stable Anesthetic complications: no    Last Vitals:  Filed Vitals:   02/29/16 1400 02/29/16 1500  BP: 117/65 109/68  Pulse: 84 79  Temp:    Resp: 14 15    Last Pain:  Filed Vitals:   02/29/16 1617  PainSc: 8                  [name] A

## 2016-03-01 LAB — BASIC METABOLIC PANEL
Anion gap: 3 — ABNORMAL LOW (ref 5–15)
BUN: 14 mg/dL (ref 6–20)
CO2: 24 mmol/L (ref 22–32)
Calcium: 7.9 mg/dL — ABNORMAL LOW (ref 8.9–10.3)
Chloride: 103 mmol/L (ref 101–111)
Creatinine, Ser: 0.71 mg/dL (ref 0.44–1.00)
GFR calc Af Amer: >60 mL/min (ref >60)
GFR calc non Af Amer: >60 mL/min (ref >60)
Glucose, Bld: 125 mg/dL — ABNORMAL HIGH (ref 65–99)
Potassium: 3.7 mmol/L (ref 3.5–5.1)
Sodium: 130 mmol/L — ABNORMAL LOW (ref 135–145)

## 2016-03-01 LAB — HEPATIC FUNCTION PANEL
ALT: 26 U/L (ref 14–54)
AST: 25 U/L (ref 15–41)
Albumin: 2.3 g/dL — ABNORMAL LOW (ref 3.5–5.0)
Alkaline Phosphatase: 78 U/L (ref 38–126)
Bilirubin, Direct: 0.2 mg/dL (ref 0.1–0.5)
Indirect Bilirubin: 0.3 mg/dL (ref 0.3–0.9)
Total Bilirubin: 0.5 mg/dL (ref 0.3–1.2)
Total Protein: 5.7 g/dL — ABNORMAL LOW (ref 6.5–8.1)

## 2016-03-01 LAB — CBC
HCT: 32.5 % — ABNORMAL LOW (ref 36.0–46.0)
Hemoglobin: 10.5 g/dL — ABNORMAL LOW (ref 12.0–15.0)
MCH: 28.5 pg (ref 26.0–34.0)
MCHC: 32.3 g/dL (ref 30.0–36.0)
MCV: 88.3 fL (ref 78.0–100.0)
Platelets: 198 10*3/uL (ref 150–400)
RBC: 3.68 MIL/uL — ABNORMAL LOW (ref 3.87–5.11)
RDW: 14 % (ref 11.5–15.5)
WBC: 10.7 10*3/uL — ABNORMAL HIGH (ref 4.0–10.5)

## 2016-03-01 LAB — MAGNESIUM: Magnesium: 1.8 mg/dL (ref 1.7–2.4)

## 2016-03-01 LAB — PHOSPHORUS: Phosphorus: 1.7 mg/dL — ABNORMAL LOW (ref 2.5–4.6)

## 2016-03-01 MED ORDER — OXYCODONE-ACETAMINOPHEN 5-325 MG PO TABS: 1.0000 | ORAL_TABLET | ORAL | Status: DC | PRN

## 2016-03-01 MED ORDER — IPRATROPIUM-ALBUTEROL 0.5-2.5 (3) MG/3ML IN SOLN
3.0000 mL | Freq: Three times a day (TID) | RESPIRATORY_TRACT | Status: DC
  Administered 2016-03-01: 3 mL via RESPIRATORY_TRACT

## 2016-03-01 MED ORDER — CIPROFLOXACIN HCL 500 MG PO TABS: 500.0000 mg | ORAL_TABLET | Freq: Two times a day (BID) | ORAL | Status: DC

## 2016-03-01 MED ORDER — ALBUTEROL SULFATE (2.5 MG/3ML) 0.083% IN NEBU
INHALATION_SOLUTION | RESPIRATORY_TRACT | Status: AC
  Filled 2016-03-01: qty 3

## 2016-03-01 NOTE — Progress Notes (Signed)
Pt d/c home, taken via wheelchair, daughter at bedside to take home, instruction, education, prescriptions given. Belongings given to pt to take home.

## 2016-03-01 NOTE — Discharge Summary (Signed)
Physician Discharge Summary  Patient ID: [name] MRN: [id number] DOB/AGE: August 16, 1951 65 y.o.  Admit date: 02/27/2016 Discharge date: 03/01/2016  Admission Diagnoses:Acute cholecystitis, cholelithiasis  Discharge Diagnoses: Same Principal Problem:   Acute cholecystitis Active Problems:   Hypertension   Dyslipidemia   Discharged Condition: good  Hospital Course: Patient is a 65 year old white female who was sent to [facility] for sound of her gallbladder due to right upper quadrant abdominal pain. She was found to have cholelithiasis with evidence of cholecystitis. She was admitted to the hospital for further evaluation treatment. She was started on IV antibiotics. She subsequently underwent a laparoscopic cholecystectomy on 02/29/2016. She was found to have a significantly inflamed gallbladder. She was monitored in the stepdown unit postoperatively due to mild hypoxia in recovery room. She did have one episode of wheezing requiring a nebulizer treatment. She currently feels fine and has no shortness of breath. Her pulse oximetry on room air is in the mid 90s. Her white blood cell count has normalized. Her liver enzyme tests are within normal limits. She is being discharged home on 03/01/2016 in good and improving condition.  Treatments: surgery: Laparoscopic cholecystectomy on 02/29/2016  Discharge Exam: Blood pressure 102/69, pulse 84, temperature 99.3 F (37.4 C), temperature source Oral, resp. rate 19, height 5\' 9"  (1.753 m), weight 137.6 kg (303 lb 5.7 oz), SpO2 96 %. General appearance: alert, cooperative and no distress Head: Normocephalic, without obvious abnormality, atraumatic Eyes: negative Resp: clear to auscultation bilaterally Cardio: regular rate and rhythm, S1, S2 normal, no murmur, click, rub or gallop GI: Soft, dressings dry and intact.  Disposition: 01-Home or Self Care     Medication List    TAKE these medications        aspirin 325 MG tablet   Take 325 mg by mouth daily.     Calcium-Magnesium-Zinc 1000-400-15 MG Tabs  Take 1 tablet by mouth daily.     ciprofloxacin 500 MG tablet  Commonly known as:  CIPRO  Take 1 tablet (500 mg total) by mouth 2 (two) times daily.     omeprazole 20 MG capsule  Commonly known as:  PRILOSEC  Take 20 mg by mouth daily. 3-4 x week     oxybutynin 5 MG tablet  Commonly known as:  DITROPAN  Take 5 mg by mouth 2 (two) times daily.     oxyCODONE-acetaminophen 5-325 MG tablet  Commonly known as:  PERCOCET/ROXICET  Take 1-2 tablets by mouth every 4 (four) hours as needed for moderate pain.     ramipril 2.5 MG capsule  Commonly known as:  ALTACE  Take 2.5 mg by mouth daily.     simvastatin 40 MG tablet  Commonly known as:  ZOCOR  Take 40 mg by mouth at bedtime.           Follow-up Information    Follow up with Jamesetta So, MD. Schedule an appointment as soon as possible for a visit on 03/13/2016.   Specialty:  General Surgery   Contact information:   [address] [id number] [phone]       Signed: Aviva Signs A 03/01/2016, 9:56 AM

## 2016-03-01 NOTE — Progress Notes (Signed)
Pharmacy Antibiotic Note  [name] is a 65 y.o. female admitted on 02/27/2016 with intra-abdominal infection.  Pharmacy has been consulted for UNASYN dosing.  She is s/p lap chole on 5/26.  Cultures are ngtd  Plan: Unasyn 1.5gm IV q6hrs Monitor labs, progress, c/s  Height: 5\' 9"  (175.3 cm) Weight: (!) 303 lb 5.7 oz (137.6 kg) IBW/kg (Calculated) : 66.2  Temp (24hrs), Avg:99.2 F (37.3 C), Min:98.2 F (36.8 C), Max:101.5 F (38.6 C)   Recent Labs Lab 02/27/16 1110 02/28/16 0458 02/29/16 1350 03/01/16 0500  WBC 16.8* 20.1*  --  10.7*  CREATININE 0.67 0.92  --  0.71  LATICACIDVEN  --   --  0.7  --     Estimated Creatinine Clearance: 107.7 mL/min (by C-G formula based on Cr of 0.71).    No Known Allergies  Anti-infectives    Start     Dose/Rate Route Frequency Ordered Stop   02/27/16 1800  ampicillin-sulbactam (UNASYN) 1.5 g in sodium chloride 0.9 % 50 mL IVPB     1.5 g 100 mL/hr over 30 Minutes Intravenous Every 6 hours 02/27/16 1606     02/27/16 1330  piperacillin-tazobactam (ZOSYN) IVPB 3.375 g     3.375 g 100 mL/hr over 30 Minutes Intravenous  Once 02/27/16 1326 02/27/16 1410      Thank you for allowing pharmacy to be a part of this patient's care.  Excell Seltzer Poteet 03/01/2016 9:14 AM

## 2016-03-01 NOTE — Discharge Instructions (Signed)
Laparoscopic Cholecystectomy, Care After °Refer to this sheet in the next few weeks. These instructions provide you with information about caring for yourself after your procedure. Your health care provider may also give you more specific instructions. Your treatment has been planned according to current medical practices, but problems sometimes occur. Call your health care provider if you have any problems or questions after your procedure. °WHAT TO EXPECT AFTER THE PROCEDURE °After your procedure, it is common to have: °· Pain at your incision sites. You will be given pain medicines to control your pain. °· Mild nausea or vomiting. This should improve after the first 24 hours. °· Bloating and possible shoulder pain from the gas that was used during the procedure. This will improve after the first 24 hours. °HOME CARE INSTRUCTIONS °Incision Care °· Follow instructions from your health care provider about how to take care of your incisions. Make sure you: °¨ Wash your hands with soap and water before you change your bandage (dressing). If soap and water are not available, use hand sanitizer. °¨ Change your dressing as told by your health care provider. °¨ Leave stitches (sutures), skin glue, or adhesive strips in place. These skin closures may need to be in place for 2 weeks or longer. If adhesive strip edges start to loosen and curl up, you may trim the loose edges. Do not remove adhesive strips completely unless your health care provider tells you to do that. °· Do not take baths, swim, or use a hot tub until your health care provider approves. Ask your health care provider if you can take showers. You may only be allowed to take sponge baths for bathing. °General Instructions °· Take over-the-counter and prescription medicines only as told by your health care provider. °· Do not drive or operate heavy machinery while taking prescription pain medicine. °· Return to your normal diet as told by your health care  provider. °· Do not lift anything that is heavier than 10 lb (4.5 kg). °· Do not play contact sports for one week or until your health care provider approves. °SEEK MEDICAL CARE IF:  °· You have redness, swelling, or pain at the site of your incision. °· You have fluid, blood, or pus coming from your incision. °· You notice a bad smell coming from your incision area. °· Your surgical incisions break open. °· You have a fever. °SEEK IMMEDIATE MEDICAL CARE IF: °· You develop a rash. °· You have difficulty breathing. °· You have chest pain. °· You have increasing pain in your shoulders (shoulder strap areas). °· You faint or have dizzy episodes while you are standing. °· You have severe pain in your abdomen. °· You have nausea or vomiting that lasts for more than one day. °  °This information is not intended to replace advice given to you by your health care provider. Make sure you discuss any questions you have with your health care provider. °  °Document Released: 09/22/2005 Document Revised: 06/13/2015 Document Reviewed: 05/04/2013 °Elsevier Interactive Patient Education ©2016 [employer] ° °

## 2016-03-04 ENCOUNTER — Encounter (HOSPITAL_COMMUNITY): Payer: Self-pay | Admitting: General Surgery

## 2016-03-04 LAB — CULTURE, BLOOD (ROUTINE X 2)
Culture: NO GROWTH
Culture: NO GROWTH

## 2016-03-12 ENCOUNTER — Encounter: Payer: Self-pay | Admitting: Gastroenterology

## 2017-09-22 DIAGNOSIS — M722 Plantar fascial fibromatosis: Secondary | ICD-10-CM | POA: Diagnosis not present

## 2017-11-16 DIAGNOSIS — M79671 Pain in right foot: Secondary | ICD-10-CM | POA: Diagnosis not present

## 2017-11-16 DIAGNOSIS — M722 Plantar fascial fibromatosis: Secondary | ICD-10-CM | POA: Diagnosis not present

## 2017-11-16 DIAGNOSIS — M7731 Calcaneal spur, right foot: Secondary | ICD-10-CM | POA: Diagnosis not present

## 2017-12-14 DIAGNOSIS — M7731 Calcaneal spur, right foot: Secondary | ICD-10-CM | POA: Diagnosis not present

## 2017-12-14 DIAGNOSIS — M79671 Pain in right foot: Secondary | ICD-10-CM | POA: Diagnosis not present

## 2017-12-14 DIAGNOSIS — M722 Plantar fascial fibromatosis: Secondary | ICD-10-CM | POA: Diagnosis not present

## 2018-06-02 DIAGNOSIS — Z1231 Encounter for screening mammogram for malignant neoplasm of breast: Secondary | ICD-10-CM | POA: Diagnosis not present

## 2018-08-13 DIAGNOSIS — M542 Cervicalgia: Secondary | ICD-10-CM | POA: Diagnosis not present

## 2018-08-13 DIAGNOSIS — M545 Low back pain: Secondary | ICD-10-CM | POA: Diagnosis not present

## 2018-08-13 DIAGNOSIS — M9901 Segmental and somatic dysfunction of cervical region: Secondary | ICD-10-CM | POA: Diagnosis not present

## 2018-08-13 DIAGNOSIS — M546 Pain in thoracic spine: Secondary | ICD-10-CM | POA: Diagnosis not present

## 2018-08-13 DIAGNOSIS — M9903 Segmental and somatic dysfunction of lumbar region: Secondary | ICD-10-CM | POA: Diagnosis not present

## 2018-08-13 DIAGNOSIS — M9902 Segmental and somatic dysfunction of thoracic region: Secondary | ICD-10-CM | POA: Diagnosis not present

## 2018-08-17 DIAGNOSIS — M545 Low back pain: Secondary | ICD-10-CM | POA: Diagnosis not present

## 2018-08-17 DIAGNOSIS — M9902 Segmental and somatic dysfunction of thoracic region: Secondary | ICD-10-CM | POA: Diagnosis not present

## 2018-08-17 DIAGNOSIS — M9903 Segmental and somatic dysfunction of lumbar region: Secondary | ICD-10-CM | POA: Diagnosis not present

## 2018-08-17 DIAGNOSIS — M9901 Segmental and somatic dysfunction of cervical region: Secondary | ICD-10-CM | POA: Diagnosis not present

## 2018-08-17 DIAGNOSIS — M542 Cervicalgia: Secondary | ICD-10-CM | POA: Diagnosis not present

## 2018-08-17 DIAGNOSIS — M546 Pain in thoracic spine: Secondary | ICD-10-CM | POA: Diagnosis not present

## 2018-08-23 DIAGNOSIS — M9902 Segmental and somatic dysfunction of thoracic region: Secondary | ICD-10-CM | POA: Diagnosis not present

## 2018-08-23 DIAGNOSIS — M546 Pain in thoracic spine: Secondary | ICD-10-CM | POA: Diagnosis not present

## 2018-08-23 DIAGNOSIS — M9903 Segmental and somatic dysfunction of lumbar region: Secondary | ICD-10-CM | POA: Diagnosis not present

## 2018-08-23 DIAGNOSIS — M9901 Segmental and somatic dysfunction of cervical region: Secondary | ICD-10-CM | POA: Diagnosis not present

## 2018-08-23 DIAGNOSIS — M545 Low back pain: Secondary | ICD-10-CM | POA: Diagnosis not present

## 2018-08-23 DIAGNOSIS — M542 Cervicalgia: Secondary | ICD-10-CM | POA: Diagnosis not present

## 2018-08-27 DIAGNOSIS — M546 Pain in thoracic spine: Secondary | ICD-10-CM | POA: Diagnosis not present

## 2018-08-27 DIAGNOSIS — M9901 Segmental and somatic dysfunction of cervical region: Secondary | ICD-10-CM | POA: Diagnosis not present

## 2018-08-27 DIAGNOSIS — M545 Low back pain: Secondary | ICD-10-CM | POA: Diagnosis not present

## 2018-08-27 DIAGNOSIS — M9902 Segmental and somatic dysfunction of thoracic region: Secondary | ICD-10-CM | POA: Diagnosis not present

## 2018-08-27 DIAGNOSIS — M542 Cervicalgia: Secondary | ICD-10-CM | POA: Diagnosis not present

## 2018-08-27 DIAGNOSIS — M9903 Segmental and somatic dysfunction of lumbar region: Secondary | ICD-10-CM | POA: Diagnosis not present

## 2018-08-30 DIAGNOSIS — M546 Pain in thoracic spine: Secondary | ICD-10-CM | POA: Diagnosis not present

## 2018-08-30 DIAGNOSIS — M9901 Segmental and somatic dysfunction of cervical region: Secondary | ICD-10-CM | POA: Diagnosis not present

## 2018-08-30 DIAGNOSIS — M9903 Segmental and somatic dysfunction of lumbar region: Secondary | ICD-10-CM | POA: Diagnosis not present

## 2018-08-30 DIAGNOSIS — M9902 Segmental and somatic dysfunction of thoracic region: Secondary | ICD-10-CM | POA: Diagnosis not present

## 2018-08-30 DIAGNOSIS — M545 Low back pain: Secondary | ICD-10-CM | POA: Diagnosis not present

## 2018-08-30 DIAGNOSIS — M542 Cervicalgia: Secondary | ICD-10-CM | POA: Diagnosis not present

## 2018-09-22 DIAGNOSIS — Z01419 Encounter for gynecological examination (general) (routine) without abnormal findings: Secondary | ICD-10-CM | POA: Diagnosis not present

## 2018-11-03 DIAGNOSIS — R69 Illness, unspecified: Secondary | ICD-10-CM | POA: Diagnosis not present

## 2018-11-25 DIAGNOSIS — R69 Illness, unspecified: Secondary | ICD-10-CM | POA: Diagnosis not present

## 2018-12-02 DIAGNOSIS — R69 Illness, unspecified: Secondary | ICD-10-CM | POA: Diagnosis not present

## 2018-12-08 ENCOUNTER — Emergency Department (HOSPITAL_COMMUNITY): Payer: PRIVATE HEALTH INSURANCE

## 2018-12-08 ENCOUNTER — Encounter (HOSPITAL_COMMUNITY): Payer: Self-pay | Admitting: Emergency Medicine

## 2018-12-08 ENCOUNTER — Other Ambulatory Visit: Payer: Self-pay

## 2018-12-08 ENCOUNTER — Emergency Department (HOSPITAL_COMMUNITY)
Admission: EM | Admit: 2018-12-08 | Discharge: 2018-12-08 | Disposition: A | Discharge status: Home / Self Care | Payer: PRIVATE HEALTH INSURANCE | Attending: Emergency Medicine | Admitting: Emergency Medicine

## 2018-12-08 DIAGNOSIS — I4430 Unspecified atrioventricular block: Secondary | ICD-10-CM | POA: Diagnosis not present

## 2018-12-08 DIAGNOSIS — Y939 Activity, unspecified: Secondary | ICD-10-CM | POA: Diagnosis not present

## 2018-12-08 DIAGNOSIS — Y92512 Supermarket, store or market as the place of occurrence of the external cause: Secondary | ICD-10-CM | POA: Diagnosis not present

## 2018-12-08 DIAGNOSIS — S43015A Anterior dislocation of left humerus, initial encounter: Secondary | ICD-10-CM | POA: Insufficient documentation

## 2018-12-08 DIAGNOSIS — R0902 Hypoxemia: Secondary | ICD-10-CM | POA: Diagnosis not present

## 2018-12-08 DIAGNOSIS — S0990XA Unspecified injury of head, initial encounter: Secondary | ICD-10-CM | POA: Insufficient documentation

## 2018-12-08 DIAGNOSIS — W01198A Fall on same level from slipping, tripping and stumbling with subsequent striking against other object, initial encounter: Secondary | ICD-10-CM | POA: Diagnosis not present

## 2018-12-08 DIAGNOSIS — W19XXXA Unspecified fall, initial encounter: Secondary | ICD-10-CM

## 2018-12-08 DIAGNOSIS — I10 Essential (primary) hypertension: Secondary | ICD-10-CM | POA: Diagnosis not present

## 2018-12-08 DIAGNOSIS — Y999 Unspecified external cause status: Secondary | ICD-10-CM | POA: Insufficient documentation

## 2018-12-08 DIAGNOSIS — I440 Atrioventricular block, first degree: Secondary | ICD-10-CM | POA: Diagnosis not present

## 2018-12-08 DIAGNOSIS — S0012XA Contusion of left eyelid and periocular area, initial encounter: Secondary | ICD-10-CM | POA: Diagnosis not present

## 2018-12-08 DIAGNOSIS — S43005A Unspecified dislocation of left shoulder joint, initial encounter: Secondary | ICD-10-CM

## 2018-12-08 DIAGNOSIS — R52 Pain, unspecified: Secondary | ICD-10-CM | POA: Diagnosis not present

## 2018-12-08 DIAGNOSIS — R609 Edema, unspecified: Secondary | ICD-10-CM | POA: Diagnosis not present

## 2018-12-08 DIAGNOSIS — S4992XA Unspecified injury of left shoulder and upper arm, initial encounter: Secondary | ICD-10-CM | POA: Diagnosis present

## 2018-12-08 MED ORDER — KETAMINE HCL 10 MG/ML IJ SOLN
1.0000 mg/kg | Freq: Once | INTRAMUSCULAR | Status: AC
  Administered 2018-12-08: 80 mg via INTRAVENOUS
  Filled 2018-12-08: qty 1

## 2018-12-08 MED ORDER — SODIUM CHLORIDE 0.9 % IV BOLUS
1000.0000 mL | Freq: Once | INTRAVENOUS | Status: AC
  Administered 2018-12-08: 1000 mL via INTRAVENOUS

## 2018-12-08 MED ORDER — HYDROMORPHONE HCL 1 MG/ML IJ SOLN
1.0000 mg | Freq: Once | INTRAMUSCULAR | Status: AC
  Administered 2018-12-08: 1 mg via INTRAVENOUS
  Filled 2018-12-08: qty 1

## 2018-12-08 MED ORDER — HYDROCODONE-ACETAMINOPHEN 5-325 MG PO TABS: 1.0000 | ORAL_TABLET | ORAL | 0 refills | Status: DC | PRN

## 2018-12-08 MED ORDER — ONDANSETRON HCL 4 MG/2ML IJ SOLN
4.0000 mg | Freq: Once | INTRAMUSCULAR | Status: AC
  Administered 2018-12-08: 4 mg via INTRAVENOUS
  Filled 2018-12-08: qty 2

## 2018-12-08 MED ORDER — PROPOFOL 10 MG/ML IV BOLUS
1.0000 mg/kg | Freq: Once | INTRAVENOUS | Status: AC
  Administered 2018-12-08: 20 mg via INTRAVENOUS
  Filled 2018-12-08: qty 20

## 2018-12-08 MED ORDER — PROPOFOL 10 MG/ML IV BOLUS
INTRAVENOUS | Status: AC | PRN
  Administered 2018-12-08: 127 mg via INTRAVENOUS

## 2018-12-08 NOTE — ED Triage Notes (Signed)
Patient fell at [place] and dislocated her left shoulder as well as bruising and edema to left eye. Patient was giving 4mg  of Zofran and 170mcg of Fentanyl by ems

## 2018-12-08 NOTE — ED Notes (Signed)
ED Provider at bedside. 

## 2018-12-08 NOTE — Sedation Documentation (Signed)
Family updated as to patient's status.

## 2018-12-08 NOTE — ED Notes (Signed)
X-ray on the way with portable

## 2018-12-08 NOTE — ED Provider Notes (Signed)
[facility] Emergency Department Provider Note MRN:  [id number]  Arrival date & time: 12/08/18     Chief Complaint   Fall   History of Present Illness   [name] is a 68 y.o. year-old female with a history of hypertension, TIA presenting to the ED with chief complaint of fall.  Patient explains that she tripped over an object falling onto her left shoulder.  Endorsing trauma to the left forehead/brow, no loss of consciousness, denies neck or back pain, no chest pain or shortness of breath, no abdominal pain, endorsing severe, 10 out of 10 left shoulder pain.  Provided with fentanyl by EMS.  Review of Systems  A complete 10 system review of systems was obtained and all systems are negative except as noted in the HPI and PMH.   Patient's Health History    Past Medical History:  Diagnosis Date  . GERD (gastroesophageal reflux disease)   . HTN (hypertension)   . Hyperlipidemia   . TIA (transient ischemic attack) 2004   no residual    Past Surgical History:  Procedure Laterality Date  . BREAST REDUCTION SURGERY    . CHOLECYSTECTOMY N/A 02/29/2016   Procedure: LAPAROSCOPIC CHOLECYSTECTOMY;  Surgeon: Aviva Signs, MD;  Location: [facility];  Service: General;  Laterality: N/A;  . COLONOSCOPY  04/13/2006   WYO:VZCHYI colon without evidence of polyps, masses, inflammatory changes diverticular or vascular ectasia/Normal retroflex view of the rectum  . REPLACEMENT TOTAL KNEE Bilateral     Family History  Problem Relation Age of Onset  . Congestive Heart Failure Mother   . Diabetes Mother   . Hypertension Mother   . Diabetes Father   . Osteoarthritis Father     Social History   Socioeconomic History  . Marital status: Married    Spouse name: Not on file  . Number of children: Not on file  . Years of education: Not on file  . Highest education level: Not on file  Occupational History  . Occupation: [place]    Employer: [employer]  Social Needs  . Financial resource strain: Not on file  . Food insecurity:    Worry: Not on file    Inability: Not on file  . Transportation needs:    Medical: Not on file    Non-medical: Not on file  Tobacco Use  . Smoking status: Never Smoker  Substance and Sexual Activity  . Alcohol use: No  . Drug use: No  . Sexual activity: Not on file  Lifestyle  . Physical activity:    Days per week: Not on file    Minutes per session: Not on file  . Stress: Not on file  Relationships  . Social connections:    Talks on phone: Not on file    Gets together: Not on file    Attends religious service: Not on file    Active member of club or organization: Not on file    Attends meetings of clubs or organizations: Not on file    Relationship status: Not on file  . Intimate partner violence:    Fear of current or ex partner: Not on file    Emotionally abused: Not on file    Physically abused: Not on file    Forced sexual activity: Not on file  Other Topics Concern  . Not on file  Social History Narrative  . Not on file     Physical Exam  Vital Signs and Nursing  Notes reviewed Vitals:   12/08/18 1405 12/08/18 1410  BP: 116/68 130/81  Pulse: 88 83  Resp: 15 18  SpO2: 93% 99%    CONSTITUTIONAL: Well-appearing, NAD NEURO:  Alert and oriented x 3, no focal deficits EYES:  eyes equal and reactive, normal extraocular movements, no entrapment, bruising to the left upper lid ENT/NECK:  no LAD, no JVD CARDIO: Regular rate, well-perfused, normal S1 and S2 PULM:  CTAB no wheezing or rhonchi GI/GU:  normal bowel sounds, non-distended, non-tender MSK/SPINE:  No gross deformities, no edema; noted deformity to the left shoulder, suspect dislocation, neurovascularly intact distally SKIN:  no rash, atraumatic PSYCH:  Appropriate speech and behavior  Diagnostic and Interventional Summary    Labs Reviewed - No data to display  CT HEAD WO CONTRAST  Final Result    CT Cervical Spine Wo  Contrast  Final Result    CT Maxillofacial Wo Contrast  Final Result    DG Shoulder Left Portable  Final Result    DG Shoulder Left Portable  Final Result    DG Shoulder Left Portable  Final Result      Medications  HYDROmorphone (DILAUDID) injection 1 mg (1 mg Intravenous Given 12/08/18 1238)  ondansetron (ZOFRAN) injection 4 mg (4 mg Intravenous Given 12/08/18 1312)  sodium chloride 0.9 % bolus 1,000 mL (0 mLs Intravenous Stopped 12/08/18 1408)  propofol (DIPRIVAN) 10 mg/mL bolus/IV push 127 mg (20 mg Intravenous Given 12/08/18 1320)  ketamine (KETALAR) injection 127 mg (80 mg Intravenous Given 12/08/18 1320)  propofol (DIPRIVAN) 10 mg/mL bolus/IV push (127 mg Intravenous Given 12/08/18 1402)     .Sedation Date/Time: 12/08/2018 3:14 PM Performed by: Maudie Flakes, MD Authorized by: Maudie Flakes, MD   Consent:    Consent obtained:  Verbal   Consent given by:  Patient   Risks discussed:  Inadequate sedation and respiratory compromise necessitating ventilatory assistance and intubation Universal protocol:    Immediately prior to procedure a time out was called: yes   Indications:    Procedure performed:  Dislocation reduction   Procedure necessitating sedation performed by:  Physician performing sedation Pre-sedation assessment:    Time since last food or drink:  3-4 hours   ASA classification: class 1 - normal, healthy patient     Neck mobility: normal     Mallampati score:  I - soft palate, uvula, fauces, pillars visible   Pre-sedation assessments completed and reviewed: airway patency, cardiovascular function, mental status and respiratory function   Immediate pre-procedure details:    Reassessment: Patient reassessed immediately prior to procedure     Reviewed: vital signs     Verified: bag valve mask available, emergency equipment available, intubation equipment available, IV patency confirmed, oxygen available and suction available   Procedure details (see MAR for exact  dosages):    Preoxygenation:  Nasal cannula   Sedation:  Ketamine and propofol   Analgesia:  Hydromorphone   Intra-procedure monitoring:  Continuous capnometry, blood pressure monitoring, cardiac monitor, continuous pulse oximetry and frequent vital sign checks   Intra-procedure events: none     Total Provider sedation time (minutes):  20 Post-procedure details:    Attendance: Constant attendance by certified staff until patient recovered     Recovery: Patient returned to pre-procedure baseline     Post-sedation assessments completed and reviewed: airway patency, mental status and respiratory function     Patient is stable for discharge or admission: yes     Patient tolerance:  Tolerated well, no immediate complications  Reduction of Left Shoulder Dislocation Date/Time: 12/08/2018 3:16 PM Performed by: Maudie Flakes, MD Authorized by: Maudie Flakes, MD  Unsuccessful attempt Consent: Verbal consent obtained. Risks and benefits: risks, benefits and alternatives were discussed Consent given by: patient Patient understanding: patient states understanding of the procedure being performed Time out: Immediately prior to procedure a "time out" was called to verify the correct patient, procedure, equipment, support staff and site/side marked as required. Local anesthesia used: no  Anesthesia: Local anesthesia used: no  Sedation: Patient sedated: yes Sedatives: propofol and ketamine Analgesia: ketamine and hydromorphone Vitals: Vital signs were monitored during sedation.  Patient tolerance: Patient tolerated the procedure well with no immediate complications  .Sedation Date/Time: 12/08/2018 3:17 PM Performed by: Maudie Flakes, MD Authorized by: Maudie Flakes, MD   Consent:    Consent obtained:  Verbal   Consent given by:  Patient   Risks discussed:  Prolonged hypoxia resulting in organ damage, prolonged sedation necessitating reversal and inadequate sedation Universal protocol:      Immediately prior to procedure a time out was called: yes   Indications:    Procedure performed:  Dislocation reduction   Procedure necessitating sedation performed by:  Different physician Pre-sedation assessment:    Time since last food or drink:  3-4 hours   ASA classification: class 1 - normal, healthy patient     Mallampati score:  I - soft palate, uvula, fauces, pillars visible   Pre-sedation assessments completed and reviewed: airway patency, mental status and respiratory function   Immediate pre-procedure details:    Reviewed: vital signs     Verified: bag valve mask available, emergency equipment available, intubation equipment available, IV patency confirmed, oxygen available and suction available   Procedure details (see MAR for exact dosages):    Preoxygenation:  Nasal cannula   Sedation:  Propofol   Intra-procedure monitoring:  Blood pressure monitoring, continuous capnometry and cardiac monitor   Intra-procedure events: none     Intra-procedure management:  Supplemental oxygen   Total Provider sedation time (minutes):  26 Post-procedure details:    Post-sedation assessments completed and reviewed: airway patency, mental status and respiratory function     Patient is stable for discharge or admission: yes     Patient tolerance:  Tolerated well, no immediate complications   Critical Care  ED Course and Medical Decision Making  I have reviewed the triage vital signs and the nursing notes.  Pertinent labs & imaging results that were available during my care of the patient were reviewed by me and considered in my medical decision making (see below for details).  Suspect shoulder dislocation, x-ray pending.  Difficult shoulder reduction with unsuccessful first attempt.  With the aid of second ED provider, Dr. Alvino Chapel, successful on second attempt.  Given full dose aspirin use, age, and evidence of trauma will CT head.  CTs are unremarkable, patient is now  appropriate for discharge, will follow up with her own personal orthopedist.  After the discussed management above, the patient was determined to be safe for discharge.  The patient was in agreement with this plan and all questions regarding their care were answered.  ED return precautions were discussed and the patient will return to the ED with any significant worsening of condition.  Barth Kirks. Sedonia Small, [place] [email or web address]  Final Clinical Impressions(s) / ED Diagnoses     ICD-10-CM   1. Fall, initial encounter W19.XXXA   2. Shoulder dislocation, left, initial  encounter S43.005A     ED Discharge Orders         Ordered    HYDROcodone-acetaminophen (NORCO/VICODIN) 5-325 MG tablet  Every 4 hours PRN     12/08/18 1612             Maudie Flakes, MD 12/08/18 [phone]

## 2018-12-08 NOTE — Sedation Documentation (Signed)
Pt talking at this time and answering questions

## 2018-12-08 NOTE — Sedation Documentation (Signed)
Vital signs stable. 

## 2018-12-08 NOTE — Discharge Instructions (Addendum)
You were evaluated in the Emergency Department and after careful evaluation, we did not find any emergent condition requiring admission or further testing in the hospital.  Your symptoms today seem to be due to a shoulder dislocation  Please follow-up with your orthopedic specialist within the next week for evaluation and further management.  You can use the pain medication provided if your pain is keeping you from sleeping for the next few nights.  Please return to the Emergency Department if you experience any worsening of your condition.  We encourage you to follow up with a primary care provider.  Thank you for allowing [place] to be a part of your care.

## 2018-12-14 DIAGNOSIS — R69 Illness, unspecified: Secondary | ICD-10-CM | POA: Diagnosis not present

## 2018-12-15 DIAGNOSIS — R69 Illness, unspecified: Secondary | ICD-10-CM | POA: Diagnosis not present

## 2018-12-22 NOTE — ED Provider Notes (Signed)
Procedure note. reduction of left shoulder dislocation with assistance by Dr. Sedonia Small.  He provided sedation which is in a separate note. Prior to procedure timeout was done.  X-rays have been reviewed. I reduced the left anterior dislocation.  Few different techniques were required but what eventually reduced shoulder was with the patient supine straight anterior traction on the upper extremity eventually reduced the dislocation.  After reduction improvement in range of motion.  Decreasing deformity of left shoulder.  Strong pulses after reduction.  Immobilized.   Marissa Belling, MD 12/22/18 [phone]

## 2019-01-19 DIAGNOSIS — R69 Illness, unspecified: Secondary | ICD-10-CM | POA: Diagnosis not present

## 2019-02-10 DIAGNOSIS — R69 Illness, unspecified: Secondary | ICD-10-CM | POA: Diagnosis not present

## 2019-03-16 DIAGNOSIS — R1011 Right upper quadrant pain: Secondary | ICD-10-CM | POA: Diagnosis not present

## 2019-03-18 ENCOUNTER — Other Ambulatory Visit (HOSPITAL_COMMUNITY): Payer: Self-pay | Admitting: Internal Medicine

## 2019-03-18 ENCOUNTER — Other Ambulatory Visit: Payer: Self-pay | Admitting: Internal Medicine

## 2019-03-18 DIAGNOSIS — K839 Disease of biliary tract, unspecified: Secondary | ICD-10-CM

## 2019-03-23 ENCOUNTER — Ambulatory Visit (HOSPITAL_COMMUNITY)
Admission: RE | Admit: 2019-03-23 | Discharge: 2019-03-23 | Disposition: A | Discharge status: Home / Self Care | Payer: Medicare HMO | Source: Ambulatory Visit | Attending: Internal Medicine | Admitting: Internal Medicine

## 2019-03-23 ENCOUNTER — Other Ambulatory Visit: Payer: Self-pay

## 2019-03-23 DIAGNOSIS — K839 Disease of biliary tract, unspecified: Secondary | ICD-10-CM | POA: Insufficient documentation

## 2019-03-23 DIAGNOSIS — K219 Gastro-esophageal reflux disease without esophagitis: Secondary | ICD-10-CM | POA: Diagnosis not present

## 2019-03-23 DIAGNOSIS — I10 Essential (primary) hypertension: Secondary | ICD-10-CM | POA: Diagnosis not present

## 2019-03-28 DIAGNOSIS — K8051 Calculus of bile duct without cholangitis or cholecystitis with obstruction: Secondary | ICD-10-CM | POA: Diagnosis not present

## 2019-04-29 ENCOUNTER — Other Ambulatory Visit: Payer: Self-pay | Admitting: Orthopaedic Surgery

## 2019-05-02 ENCOUNTER — Other Ambulatory Visit: Payer: Self-pay

## 2019-05-02 ENCOUNTER — Encounter (HOSPITAL_BASED_OUTPATIENT_CLINIC_OR_DEPARTMENT_OTHER): Payer: Self-pay | Admitting: *Deleted

## 2019-05-02 ENCOUNTER — Other Ambulatory Visit (HOSPITAL_COMMUNITY)
Admission: RE | Admit: 2019-05-02 | Discharge: 2019-05-02 | Disposition: A | Discharge status: Home / Self Care | Payer: Medicare HMO | Source: Ambulatory Visit | Attending: Orthopaedic Surgery | Admitting: Orthopaedic Surgery

## 2019-05-02 DIAGNOSIS — Z20828 Contact with and (suspected) exposure to other viral communicable diseases: Secondary | ICD-10-CM | POA: Insufficient documentation

## 2019-05-02 NOTE — Progress Notes (Signed)
BMI 42 reviewed with Dr Ambrose Pancoast. Ok to proceed with surgery as scheduled.

## 2019-05-03 LAB — SARS CORONAVIRUS 2 (TAT 6-24 HRS): SARS Coronavirus 2: NEGATIVE

## 2019-05-04 NOTE — H&P (Signed)
[name] is an 68 y.o. female.   Chief Complaint:  Left shoulder pain HPI: [name] is here today for follow-up of her left shoulder.  She continues to work under a light duty capacity.  Since last visit she has undergone MRI scan and  is here to go over the results.  She still has a difficult time trying to raise her arm.  It will also occasionally wake her at night.  She uses her other arm.  She can push it past the horizon.   MRI:  I reviewed an MRI scan films and report of a study done at the [facility] on 03/23/2019 of the left shoulder demonstrates a large full-thickness near complete tear of the supraspinatus tendon with a few intact posterior fibers.  There is also mild tendinosis of the infraspinatus tendon.  Past Medical History:  Diagnosis Date  . GERD (gastroesophageal reflux disease)   . HTN (hypertension)   . Hyperlipidemia   . TIA (transient ischemic attack) 2004   no residual    Past Surgical History:  Procedure Laterality Date  . BREAST REDUCTION SURGERY    . CHOLECYSTECTOMY N/A 02/29/2016   Procedure: LAPAROSCOPIC CHOLECYSTECTOMY;  Surgeon: Aviva Signs, MD;  Location: [facility];  Service: General;  Laterality: N/A;  . COLONOSCOPY  04/13/2006   WCB:JSEGBT colon without evidence of polyps, masses, inflammatory changes diverticular or vascular ectasia/Normal retroflex view of the rectum  . JOINT REPLACEMENT    . REPLACEMENT TOTAL KNEE Bilateral     Family History  Problem Relation Age of Onset  . Congestive Heart Failure Mother   . Diabetes Mother   . Hypertension Mother   . Diabetes Father   . Osteoarthritis Father    Social History:  reports that she has never smoked. She has never used smokeless tobacco. She reports that she does not drink alcohol or use drugs.  Allergies: No Known Allergies  No medications prior to admission.    Results for orders placed or performed during the hospital encounter of 05/02/19 (from the past 48 hour(s))  SARS Coronavirus 2  (Performed in [place] hospital lab)     Status: None   Collection Time: 05/02/19  1:38 PM   Specimen: Nasal Swab  Result Value Ref Range   SARS Coronavirus 2 NEGATIVE NEGATIVE    Comment: (NOTE) SARS-CoV-2 target nucleic acids are NOT DETECTED. The SARS-CoV-2 RNA is generally detectable in upper and lower respiratory specimens during the acute phase of infection. Negative results do not preclude SARS-CoV-2 infection, do not rule out co-infections with other pathogens, and should not be used as the sole basis for treatment or other patient management decisions. Negative results must be combined with clinical observations, patient history, and epidemiological information. The expected result is Negative. Fact Sheet for Patients: SugarRoll.be Fact Sheet for Healthcare Providers: https://www.woods-mathews.com/ This test is not yet approved or cleared by the [place] FDA and  has been authorized for detection and/or diagnosis of SARS-CoV-2 by FDA under an Emergency Use Authorization (EUA). This EUA will remain  in effect (meaning this test can be used) for the duration of the COVID-19 declaration under Section 56 4(b)(1) of the Act, 21 U.S.C. section 360bbb-3(b)(1), unless the authorization is terminated or revoked sooner. Performed at [facility], [place] [address] [place], [place] [address]    No results found.  Review of Systems  Musculoskeletal:       Left shoulder pain  All other systems reviewed and are negative.  Height 5\' 8"  (1.727 m), weight 125.8 kg. Physical Exam  Constitutional: She is oriented to person, place, and time. She appears well-developed and well-nourished.  HENT:  Head: Normocephalic and atraumatic.  Eyes: Pupils are equal, round, and reactive to light. Conjunctivae are normal.  Neck: Normal range of motion.  Cardiovascular: Normal rate and regular rhythm.  Respiratory: Effort normal.  GI: Soft.   Musculoskeletal:     Comments: Examination left shoulder shows active forward flexion of about 45 and passively she can get this to 110.  External range of motion is about 65.  Internal rotation to her back pocket.  Normal sensation motor function throughout her arm.  She is neurovascularly intact distally.    Neurological: She is alert and oriented to person, place, and time.  Skin: Skin is warm and dry.  Psychiatric: She has a normal mood and affect. Her behavior is normal. Judgment and thought content normal.     Assessment/Plan Assessment:  Status post left shoulder dislocation 12/08/2018 with full-thickness rotator cuff tear by MRI  Plan: After discussing with the patient we informed her that she does have a large full-thickness rotator cuff tear based on her MRI scan.  At this point she is 3 months out and is not making any good progress.  We are going to hold on any further physical therapy.  At this point we discussed a left shoulder arthroscopy with rotator cuff repair.  We went over the risk of anesthesia, infection, and DVT related to the procedure and she would like to proceed with this.  This is going to be done on an outpatient basis.  We informed her that she be out of work for 2-4 weeks and we will get her back in a light duty capacity.  In the meantime she is going to continue  with the same light duty work restrictions.    Larwance Sachs Brion Hedges, PA-C 05/04/2019, 1:20 PM

## 2019-05-04 NOTE — Anesthesia Preprocedure Evaluation (Addendum)
Anesthesia Evaluation  Patient identified by MRN, date of birth, ID band Patient awake    Reviewed: Allergy & Precautions, NPO status , Patient's Chart, lab work & pertinent test results  History of Anesthesia Complications Negative for: history of anesthetic complications  Airway Mallampati: II  TM Distance: >3 FB Neck ROM: Full    Dental no notable dental hx.    Pulmonary neg pulmonary ROS,    Pulmonary exam normal        Cardiovascular hypertension, Pt. on medications Normal cardiovascular exam     Neuro/Psych TIA (2004)   GI/Hepatic Neg liver ROS, GERD  Controlled,  Endo/Other  Morbid obesity  Renal/GU negative Renal ROS     Musculoskeletal negative musculoskeletal ROS (+)   Abdominal   Peds  Hematology negative hematology ROS (+)   Anesthesia Other Findings Day of surgery medications reviewed with the patient.  Reproductive/Obstetrics                            Anesthesia Physical Anesthesia Plan  ASA: III  Anesthesia Plan: General   Post-op Pain Management: GA combined w/ Regional for post-op pain   Induction: Intravenous  PONV Risk Score and Plan: 4 or greater and Ondansetron, Dexamethasone, Treatment may vary due to age or medical condition and Midazolam  Airway Management Planned: Oral ETT  Additional Equipment:   Intra-op Plan:   Post-operative Plan: Extubation in OR  Informed Consent: I have reviewed the patients History and Physical, chart, labs and discussed the procedure including the risks, benefits and alternatives for the proposed anesthesia with the patient or authorized representative who has indicated his/her understanding and acceptance.     Dental advisory given  Plan Discussed with: CRNA  Anesthesia Plan Comments:        Anesthesia Quick Evaluation

## 2019-05-05 ENCOUNTER — Ambulatory Visit (HOSPITAL_BASED_OUTPATIENT_CLINIC_OR_DEPARTMENT_OTHER): Payer: PRIVATE HEALTH INSURANCE | Admitting: Anesthesiology

## 2019-05-05 ENCOUNTER — Other Ambulatory Visit: Payer: Self-pay

## 2019-05-05 ENCOUNTER — Encounter (HOSPITAL_BASED_OUTPATIENT_CLINIC_OR_DEPARTMENT_OTHER)
Admission: RE | Disposition: A | Discharge status: Home / Self Care | Payer: Self-pay | Source: Home / Self Care | Attending: Orthopaedic Surgery

## 2019-05-05 ENCOUNTER — Ambulatory Visit (HOSPITAL_BASED_OUTPATIENT_CLINIC_OR_DEPARTMENT_OTHER)
Admission: RE | Admit: 2019-05-05 | Discharge: 2019-05-05 | Disposition: A | Discharge status: Home / Self Care | Payer: PRIVATE HEALTH INSURANCE | Attending: Orthopaedic Surgery | Admitting: Orthopaedic Surgery

## 2019-05-05 ENCOUNTER — Encounter (HOSPITAL_BASED_OUTPATIENT_CLINIC_OR_DEPARTMENT_OTHER): Payer: Self-pay

## 2019-05-05 DIAGNOSIS — Z7982 Long term (current) use of aspirin: Secondary | ICD-10-CM | POA: Diagnosis not present

## 2019-05-05 DIAGNOSIS — S46012A Strain of muscle(s) and tendon(s) of the rotator cuff of left shoulder, initial encounter: Secondary | ICD-10-CM | POA: Insufficient documentation

## 2019-05-05 DIAGNOSIS — E785 Hyperlipidemia, unspecified: Secondary | ICD-10-CM | POA: Diagnosis not present

## 2019-05-05 DIAGNOSIS — Z8673 Personal history of transient ischemic attack (TIA), and cerebral infarction without residual deficits: Secondary | ICD-10-CM | POA: Diagnosis not present

## 2019-05-05 DIAGNOSIS — I10 Essential (primary) hypertension: Secondary | ICD-10-CM | POA: Diagnosis not present

## 2019-05-05 DIAGNOSIS — K219 Gastro-esophageal reflux disease without esophagitis: Secondary | ICD-10-CM | POA: Insufficient documentation

## 2019-05-05 DIAGNOSIS — Z79899 Other long term (current) drug therapy: Secondary | ICD-10-CM | POA: Diagnosis not present

## 2019-05-05 DIAGNOSIS — Z6841 Body Mass Index (BMI) 40.0 and over, adult: Secondary | ICD-10-CM | POA: Diagnosis not present

## 2019-05-05 DIAGNOSIS — X58XXXA Exposure to other specified factors, initial encounter: Secondary | ICD-10-CM | POA: Diagnosis not present

## 2019-05-05 HISTORY — PX: SHOULDER ARTHROSCOPY WITH ROTATOR CUFF REPAIR AND SUBACROMIAL DECOMPRESSION: SHX5686

## 2019-05-05 SURGERY — SHOULDER ARTHROSCOPY WITH ROTATOR CUFF REPAIR AND SUBACROMIAL DECOMPRESSION
Anesthesia: General | Site: Shoulder | Laterality: Left

## 2019-05-05 MED ORDER — BUPIVACAINE-EPINEPHRINE (PF) 0.5% -1:200000 IJ SOLN
INTRAMUSCULAR | Status: DC | PRN
  Administered 2019-05-05: 15 mL via PERINEURAL

## 2019-05-05 MED ORDER — FENTANYL CITRATE (PF) 100 MCG/2ML IJ SOLN
INTRAMUSCULAR | Status: AC
  Filled 2019-05-05: qty 2

## 2019-05-05 MED ORDER — ONDANSETRON HCL 4 MG/2ML IJ SOLN
INTRAMUSCULAR | Status: AC
  Filled 2019-05-05: qty 2

## 2019-05-05 MED ORDER — CEFAZOLIN SODIUM-DEXTROSE 2-4 GM/100ML-% IV SOLN
INTRAVENOUS | Status: AC
  Filled 2019-05-05: qty 100

## 2019-05-05 MED ORDER — OXYCODONE HCL 5 MG PO TABS: 5.0000 mg | ORAL_TABLET | Freq: Once | ORAL | Status: DC | PRN

## 2019-05-05 MED ORDER — CEFAZOLIN SODIUM-DEXTROSE 2-4 GM/100ML-% IV SOLN
2.0000 g | INTRAVENOUS | Status: AC
  Administered 2019-05-05: 13:00:00 2 g via INTRAVENOUS

## 2019-05-05 MED ORDER — FENTANYL CITRATE (PF) 100 MCG/2ML IJ SOLN
50.0000 ug | INTRAMUSCULAR | Status: DC | PRN
  Administered 2019-05-05: 50 ug via INTRAVENOUS

## 2019-05-05 MED ORDER — EPHEDRINE SULFATE 50 MG/ML IJ SOLN
INTRAMUSCULAR | Status: DC | PRN
  Administered 2019-05-05 (×2): 10 mg via INTRAVENOUS

## 2019-05-05 MED ORDER — DEXAMETHASONE SODIUM PHOSPHATE 10 MG/ML IJ SOLN
INTRAMUSCULAR | Status: AC
  Filled 2019-05-05: qty 1

## 2019-05-05 MED ORDER — CHLORHEXIDINE GLUCONATE 4 % EX LIQD: 60.0000 mL | Freq: Once | CUTANEOUS | Status: DC

## 2019-05-05 MED ORDER — FENTANYL CITRATE (PF) 100 MCG/2ML IJ SOLN: 25.0000 ug | INTRAMUSCULAR | Status: DC | PRN

## 2019-05-05 MED ORDER — MIDAZOLAM HCL 2 MG/2ML IJ SOLN
INTRAMUSCULAR | Status: AC
  Filled 2019-05-05: qty 2

## 2019-05-05 MED ORDER — HYDROCODONE-ACETAMINOPHEN 5-325 MG PO TABS: 1.0000 | ORAL_TABLET | Freq: Four times a day (QID) | ORAL | 0 refills | Status: DC | PRN

## 2019-05-05 MED ORDER — LACTATED RINGERS IV SOLN: INTRAVENOUS | Status: DC

## 2019-05-05 MED ORDER — PROMETHAZINE HCL 25 MG/ML IJ SOLN: 6.2500 mg | INTRAMUSCULAR | Status: DC | PRN

## 2019-05-05 MED ORDER — PROPOFOL 10 MG/ML IV BOLUS
INTRAVENOUS | Status: AC
  Filled 2019-05-05: qty 40

## 2019-05-05 MED ORDER — ONDANSETRON HCL 4 MG/2ML IJ SOLN
INTRAMUSCULAR | Status: DC | PRN
  Administered 2019-05-05: 4 mg via INTRAVENOUS

## 2019-05-05 MED ORDER — LACTATED RINGERS IV SOLN
INTRAVENOUS | Status: DC
  Administered 2019-05-05: 13:00:00 via INTRAVENOUS

## 2019-05-05 MED ORDER — BUPIVACAINE LIPOSOME 1.3 % IJ SUSP
INTRAMUSCULAR | Status: DC | PRN
  Administered 2019-05-05: 10 mL via PERINEURAL

## 2019-05-05 MED ORDER — LIDOCAINE 2% (20 MG/ML) 5 ML SYRINGE
INTRAMUSCULAR | Status: DC | PRN
  Administered 2019-05-05: 50 mg via INTRAVENOUS

## 2019-05-05 MED ORDER — OXYCODONE HCL 5 MG/5ML PO SOLN: 5.0000 mg | Freq: Once | ORAL | Status: DC | PRN

## 2019-05-05 MED ORDER — LIDOCAINE 2% (20 MG/ML) 5 ML SYRINGE
INTRAMUSCULAR | Status: AC
  Filled 2019-05-05: qty 5

## 2019-05-05 MED ORDER — POVIDONE-IODINE 10 % EX SWAB: 2.0000 "application " | Freq: Once | CUTANEOUS | Status: DC

## 2019-05-05 MED ORDER — PROPOFOL 10 MG/ML IV BOLUS
INTRAVENOUS | Status: DC | PRN
  Administered 2019-05-05: 200 mg via INTRAVENOUS

## 2019-05-05 MED ORDER — MIDAZOLAM HCL 2 MG/2ML IJ SOLN
1.0000 mg | INTRAMUSCULAR | Status: DC | PRN
  Administered 2019-05-05: 1 mg via INTRAVENOUS

## 2019-05-05 MED ORDER — SCOPOLAMINE 1 MG/3DAYS TD PT72: 1.0000 | MEDICATED_PATCH | Freq: Once | TRANSDERMAL | Status: DC

## 2019-05-05 MED ORDER — ACETAMINOPHEN 10 MG/ML IV SOLN: 1000.0000 mg | Freq: Once | INTRAVENOUS | Status: DC | PRN

## 2019-05-05 MED ORDER — SUCCINYLCHOLINE CHLORIDE 20 MG/ML IJ SOLN
INTRAMUSCULAR | Status: DC | PRN
  Administered 2019-05-05: 140 mg via INTRAVENOUS

## 2019-05-05 MED ORDER — SODIUM CHLORIDE 0.9 % IR SOLN
Status: DC | PRN
  Administered 2019-05-05: 6000 mL

## 2019-05-05 MED ORDER — SUCCINYLCHOLINE CHLORIDE 200 MG/10ML IV SOSY
PREFILLED_SYRINGE | INTRAVENOUS | Status: AC
  Filled 2019-05-05: qty 10

## 2019-05-05 MED ORDER — DEXAMETHASONE SODIUM PHOSPHATE 4 MG/ML IJ SOLN
INTRAMUSCULAR | Status: DC | PRN
  Administered 2019-05-05: 5 mg via INTRAVENOUS

## 2019-05-05 SURGICAL SUPPLY — 74 items
ANCHOR SUT POPLOK 4.5 (Anchor) ×1 IMPLANT
ANCHOR SUT POPLOK 4.5MM (Anchor) ×1 IMPLANT
BENZOIN TINCTURE PRP APPL 2/3 (GAUZE/BANDAGES/DRESSINGS) IMPLANT
BLADE EXCALIBUR 4.0MM X 13CM (MISCELLANEOUS) ×1
BLADE EXCALIBUR 4.0X13 (MISCELLANEOUS) ×3 IMPLANT
BLADE SURG 15 STRL LF DISP TIS (BLADE) IMPLANT
BLADE SURG 15 STRL SS (BLADE)
BUR VERTEX HOODED 4.5 (BURR) IMPLANT
BURR OVAL 8 FLU 4.0MM X 13CM (MISCELLANEOUS) ×1
BURR OVAL 8 FLU 4.0X13 (MISCELLANEOUS) ×1 IMPLANT
CANNULA SHOULDER 7CM (CANNULA) ×4 IMPLANT
CANNULA TWIST IN 8.25X7CM (CANNULA) ×2 IMPLANT
CLOSURE WOUND 1/2 X4 (GAUZE/BANDAGES/DRESSINGS)
COVER WAND RF STERILE (DRAPES) IMPLANT
DECANTER SPIKE VIAL GLASS SM (MISCELLANEOUS) IMPLANT
DERMABOND ADVANCED (GAUZE/BANDAGES/DRESSINGS)
DERMABOND ADVANCED .7 DNX12 (GAUZE/BANDAGES/DRESSINGS) IMPLANT
DRAPE HALF SHEET 70X43 (DRAPES) ×4 IMPLANT
DRAPE SPLIT 6X30 W/TAPE (DRAPES) ×8 IMPLANT
DRAPE STERI 35X30 U-POUCH (DRAPES) ×4 IMPLANT
DRAPE U-SHAPE 47X51 STRL (DRAPES) ×4 IMPLANT
DRSG EMULSION OIL 3X3 NADH (GAUZE/BANDAGES/DRESSINGS) ×4 IMPLANT
DRSG PAD ABDOMINAL 8X10 ST (GAUZE/BANDAGES/DRESSINGS) ×4 IMPLANT
DURAPREP 26ML APPLICATOR (WOUND CARE) ×4 IMPLANT
ELECT MENISCUS 165MM 90D (ELECTRODE) IMPLANT
ELECT REM PT RETURN 9FT ADLT (ELECTROSURGICAL)
ELECTRODE REM PT RTRN 9FT ADLT (ELECTROSURGICAL) ×2 IMPLANT
GAUZE SPONGE 4X4 12PLY STRL (GAUZE/BANDAGES/DRESSINGS) ×4 IMPLANT
GLOVE BIO SURGEON STRL SZ8 (GLOVE) ×8 IMPLANT
GLOVE BIOGEL PI IND STRL 7.0 (GLOVE) IMPLANT
GLOVE BIOGEL PI IND STRL 8 (GLOVE) ×4 IMPLANT
GLOVE BIOGEL PI INDICATOR 7.0 (GLOVE) ×2
GLOVE BIOGEL PI INDICATOR 8 (GLOVE) ×6
GLOVE ECLIPSE 6.5 STRL STRAW (GLOVE) ×2 IMPLANT
GOWN STRL REUS W/ TWL LRG LVL3 (GOWN DISPOSABLE) ×4 IMPLANT
GOWN STRL REUS W/ TWL XL LVL3 (GOWN DISPOSABLE) ×4 IMPLANT
GOWN STRL REUS W/TWL LRG LVL3 (GOWN DISPOSABLE) ×4
GOWN STRL REUS W/TWL XL LVL3 (GOWN DISPOSABLE) ×4
MANIFOLD NEPTUNE II (INSTRUMENTS) ×4 IMPLANT
NDL SCORPION MULTI FIRE (NEEDLE) IMPLANT
NDL SUT 6 .5 CRC .975X.05 MAYO (NEEDLE) IMPLANT
NEEDLE MAYO TAPER (NEEDLE)
NEEDLE SCORPION MULTI FIRE (NEEDLE) ×4 IMPLANT
NS IRRIG 1000ML POUR BTL (IV SOLUTION) IMPLANT
PACK ARTHROSCOPY DSU (CUSTOM PROCEDURE TRAY) ×4 IMPLANT
PACK BASIN DAY SURGERY FS (CUSTOM PROCEDURE TRAY) ×4 IMPLANT
PASSER SUT SWANSON 36MM LOOP (INSTRUMENTS) IMPLANT
PENCIL BUTTON HOLSTER BLD 10FT (ELECTRODE) IMPLANT
PORT APPOLLO RF 90DEGREE MULTI (SURGICAL WAND) ×4 IMPLANT
RESTRAINT HEAD UNIVERSAL NS (MISCELLANEOUS) ×4 IMPLANT
SLEEVE SCD COMPRESS KNEE MED (MISCELLANEOUS) IMPLANT
SLING ARM FOAM STRAP LRG (SOFTGOODS) ×2 IMPLANT
SLING ARM MED ADULT FOAM STRAP (SOFTGOODS) IMPLANT
SLING ARM SM FOAM STRAP (SOFTGOODS) IMPLANT
SLING ARM XL FOAM STRAP (SOFTGOODS) IMPLANT
SPONGE LAP 4X18 RFD (DISPOSABLE) IMPLANT
STRIP CLOSURE SKIN 1/2X4 (GAUZE/BANDAGES/DRESSINGS) IMPLANT
SUCTION FRAZIER HANDLE 10FR (MISCELLANEOUS)
SUCTION TUBE FRAZIER 10FR DISP (MISCELLANEOUS) IMPLANT
SUT ETHIBOND 2 OS 4 DA (SUTURE) IMPLANT
SUT ETHILON 3 0 PS 1 (SUTURE) ×4 IMPLANT
SUT FIBERWIRE #2 38 T-5 BLUE (SUTURE)
SUT HI-FI 2 STRAND C-2 40 (SUTURE) ×2 IMPLANT
SUT PDS AB 2-0 CT2 27 (SUTURE) IMPLANT
SUT VIC AB 0 SH 27 (SUTURE) IMPLANT
SUT VIC AB 2-0 SH 27 (SUTURE)
SUT VIC AB 2-0 SH 27XBRD (SUTURE) IMPLANT
SUT VICRYL 4-0 PS2 18IN ABS (SUTURE) IMPLANT
SUTURE FIBERWR #2 38 T-5 BLUE (SUTURE) IMPLANT
SYR BULB 3OZ (MISCELLANEOUS) IMPLANT
TOWEL GREEN STERILE FF (TOWEL DISPOSABLE) ×4 IMPLANT
TUBING ARTHROSCOPY IRRIG 16FT (MISCELLANEOUS) ×4 IMPLANT
WATER STERILE IRR 1000ML POUR (IV SOLUTION) ×4 IMPLANT
YANKAUER SUCT BULB TIP NO VENT (SUCTIONS) IMPLANT

## 2019-05-05 NOTE — Anesthesia Procedure Notes (Signed)
Anesthesia Regional Block: Interscalene brachial plexus block   Pre-Anesthetic Checklist: ,, timeout performed, Correct Patient, Correct Site, Correct Laterality, Correct Procedure, Correct Position, site marked, Risks and benefits discussed, pre-op evaluation,  At surgeon's request and post-op pain management  Laterality: Left  Prep: Maximum Sterile Barrier Precautions used, chloraprep       Needles:  Injection technique: Single-shot  Needle Type: Echogenic Stimulator Needle     Needle Length: 9cm  Needle Gauge: 22     Additional Needles:   Procedures:,,,, ultrasound used (permanent image in chart),,,,  Narrative:  Start time: 05/05/2019 1:00 PM End time: 05/05/2019 1:02 PM Injection made incrementally with aspirations every 5 mL.  Performed by: Personally  Anesthesiologist: Brennan Bailey, MD  Additional Notes: Risks, benefits, and alternative discussed. Patient gave consent for procedure. Patient prepped and draped in sterile fashion. Sedation administered, patient remains easily responsive to voice. Relevant anatomy identified with ultrasound guidance. Local anesthetic given in 5cc increments with no signs or symptoms of intravascular injection. No pain or paraesthesias with injection. Patient monitored throughout procedure with signs of LAST or immediate complications. Tolerated well. Ultrasound image placed in chart.  Tawny Asal, MD

## 2019-05-05 NOTE — Anesthesia Postprocedure Evaluation (Signed)
Anesthesia Post Note  Patient: [name]  Procedure(s) Performed: Left Shoulder Arthroscopy and Rotator Cuff Repair ACROMIOPLASTY (Left Shoulder)     Patient location during evaluation: PACU Anesthesia Type: General Level of consciousness: awake and alert and oriented Pain management: pain level controlled Vital Signs Assessment: post-procedure vital signs reviewed and stable Respiratory status: spontaneous breathing, nonlabored ventilation and respiratory function stable Cardiovascular status: blood pressure returned to baseline Postop Assessment: no apparent nausea or vomiting Anesthetic complications: no    Last Vitals:  Vitals:   05/05/19 1500 05/05/19 1515  BP: 122/70 133/66  Pulse: 80 77  Resp: 20 20  Temp:    SpO2: 100% 99%    Last Pain:  Vitals:   05/05/19 1520  TempSrc:   PainSc: 0-No pain                 Brennan Bailey

## 2019-05-05 NOTE — Anesthesia Procedure Notes (Signed)
Procedure Name: Intubation Date/Time: 05/05/2019 1:36 PM Performed by: Willa Frater, CRNA Pre-anesthesia Checklist: Patient identified, Emergency Drugs available, Suction available and Patient being monitored Patient Re-evaluated:Patient Re-evaluated prior to induction Oxygen Delivery Method: Circle system utilized Preoxygenation: Pre-oxygenation with 100% oxygen Induction Type: IV induction Ventilation: Mask ventilation without difficulty Laryngoscope Size: 3 Grade View: Grade I Tube type: Oral Number of attempts: 1 Airway Equipment and Method: Stylet and Oral airway Placement Confirmation: ETT inserted through vocal cords under direct vision,  positive ETCO2 and breath sounds checked- equal and bilateral Secured at: 22 cm Tube secured with: Tape Dental Injury: Teeth and Oropharynx as per pre-operative assessment

## 2019-05-05 NOTE — Op Note (Signed)
GABBY RACKERS [id number] 05/05/2019   PRE-OP DIAGNOSIS: left sh RCT  POST-OP DIAGNOSIS: same  PROCEDURE: left sh scope RCR  ANESTHESIA: general and block  Hessie Dibble   Dictation #:  [id number]

## 2019-05-05 NOTE — Progress Notes (Signed)
Assisted Dr. Daiva Huge with left, ultrasound guided, interscalene  block. Side rails up, monitors on throughout procedure. See vital signs in flow sheet. Tolerated Procedure well.

## 2019-05-05 NOTE — Interval H&P Note (Signed)
History and Physical Interval Note:  05/05/2019 1:23 PM  [name]  has presented today for surgery, with the diagnosis of Left Shoulder Rotator Cuff Tear.  The various methods of treatment have been discussed with the patient and family. After consideration of risks, benefits and other options for treatment, the patient has consented to  Procedure(s): Left Shoulder Arthroscopy and Rotator Cuff Repair (Left) as a surgical intervention.  The patient's history has been reviewed, patient examined, no change in status, stable for surgery.  I have reviewed the patient's chart and labs.  Questions were answered to the patient's satisfaction.     Hessie Dibble

## 2019-05-05 NOTE — Transfer of Care (Signed)
Immediate Anesthesia Transfer of Care Note  Patient: [name]  Procedure(s) Performed: Left Shoulder Arthroscopy and Rotator Cuff Repair ACROMIOPLASTY (Left Shoulder)  Patient Location: PACU  Anesthesia Type:GA combined with regional for post-op pain  Level of Consciousness: awake, alert , oriented and drowsy  Airway & Oxygen Therapy: Patient Spontanous Breathing and Patient connected to face mask oxygen  Post-op Assessment: Report given to RN and Post -op Vital signs reviewed and stable  Post vital signs: Reviewed and stable  Last Vitals:  Vitals Value Taken Time  BP    Temp    Pulse 88 05/05/19 1450  Resp 23 05/05/19 1450  SpO2 99 % 05/05/19 1450  Vitals shown include unvalidated device data.  Last Pain:  Vitals:   05/05/19 1244  TempSrc: (P) Oral  PainSc:          Complications: No apparent anesthesia complications

## 2019-05-05 NOTE — Op Note (Addendum)
NAME: [name], [name] MEDICAL RECORD [id number] ACCOUNT [id number] DATE OF BIRTH:November 17, 1950 FACILITY: [facility] LOCATION: [facility] PHYSICIAN:Mashanda Ishibashi Autumn Patty, MD  OPERATIVE REPORT  DATE OF PROCEDURE:  05/05/2019  PREOPERATIVE DIAGNOSES: 1.  Left shoulder impingement. 2.  Left shoulder rotator cuff tear.  POSTOPERATIVE DIAGNOSES: 1.  Left shoulder impingement. 2.  Left shoulder rotator cuff tear.  PROCEDURE: 1.  Left shoulder arthroscopic acromioplasty. 2.  Left shoulder arthroscopic debridement. 3.  Left shoulder arthroscopic rotator cuff repair.  ANESTHESIA:  General and block.  ATTENDING SURGEON:  Melrose Nakayama, MD  ASSISTANT:  Loni Dolly, PA-C   INDICATIONS:  The patient is a 68 year old woman who dislocated her shoulder about 3 or 4 months back.  She went through physical therapy and has been left with a stable shoulder, though it is extremely painful.  By MRI scan, she has evidence of a labral  tear along with a full-thickness rotator cuff tear involving the supraspinatus.  There is no atrophy of the muscle.  She is offered arthroscopic repair.  Informed operative consent was obtained after discussion of possible complications including  reaction to anesthesia and infection.  SUMMARY OF FINDINGS AND PROCEDURE:  Under general anesthesia and a block, a left shoulder arthroscopy was performed through a total of 3 portals.  Glenohumeral joint showed no degenerative change.  She did have some labral tearing but appeared to have a substantial portion of the anteroinferior labrum well attached.  The biceps tendon looked normal.  She had an obvious tear of the rotator cuff seen from below and above.  In the subacromial space, she had a very prominent subacromial morphology, which was addressed with an acromioplasty followed by arthroscopic rotator cuff repair.  DESCRIPTION OF PROCEDURE:  The patient was taken to the operating suite where general anesthetic was applied without  difficulty.  She was also given a block in the preanesthesia area.  She was positioned in beach-chair position and prepped and draped in normal sterile fashion.  After the administration of IV Kefzol and an appropriate time-out, an arthroscopy of the left shoulder was performed through a total of 3 portals.  Findings were as noted above.  Procedure consisted initially of the acromioplasty, which was done with the bur in the lateral position, followed by transfer of the bur to the posterior position.  I then performed an extensive debridement involving some devitalized portions of the supraspinatus and some bursal tissues in the subacromial space.  She seemed to have adequate tissues for repair.  I also debrided the greater tuberosity down to bleeding bone with a bur.  We then passed 2 inverted mattress sutures of Hi-Fi suture and utilized this to repair the rotator cuff using 1 PopLok anchor by [employer] which was 4.5 mm in diameter.  This brought the tendon down onto the bleeding bed of bone and gave [place] a nice tight repair under no significant tension.  The shoulder was thoroughly irrigated followed by removal of arthroscopic equipment.  Loni Dolly assisted throughout the case and was invaluable to its completion in that he passed instruments and made this possible in an arthroscopic fashion.  ESTIMATED BLOOD LOSS:  None.  FLUIDS:  Obtain from the anesthesia records, asking other information.  We did reapproximate the portals loosely with nylon followed by Adaptic, dry gauze, and tape.  DISPOSITION:  The patient was extubated in the operating room and taken to recovery room in stable condition.  She was to go home the same day and follow up in the office in  less than a week.  I will contact her by phone tonight.  LN/NUANCE  D:05/05/2019 T:05/05/2019 JOB:[id number]

## 2019-05-05 NOTE — Discharge Instructions (Signed)
Information for Discharge Teaching: EXPAREL (bupivacaine liposome injectable suspension)   Your surgeon or anesthesiologist gave you EXPAREL(bupivacaine) to help control your pain after surgery.   EXPAREL is a local anesthetic that provides pain relief by numbing the tissue around the surgical site.  EXPAREL is designed to release pain medication over time and can control pain for up to 72 hours.  Depending on how you respond to EXPAREL, you may require less pain medication during your recovery.  Possible side effects:  Temporary loss of sensation or ability to move in the area where bupivacaine was injected.  Nausea, vomiting, constipation  Rarely, numbness and tingling in your mouth or lips, lightheadedness, or anxiety may occur.  Call your doctor right away if you think you may be experiencing any of these sensations, or if you have other questions regarding possible side effects.  Follow all other discharge instructions given to you by your surgeon or nurse. Eat a healthy diet and drink plenty of water or other fluids.  If you return to the hospital for any reason within 96 hours following the administration of EXPAREL, it is important for health care providers to know that you have received this anesthetic. A teal colored band has been placed on your arm with the date, time and amount of EXPAREL you have received in order to alert and inform your health care providers. Please leave this armband in place for the full 96 hours following administration, and then you may remove the band. Post Anesthesia Home Care Instructions  Activity: Get plenty of rest for the remainder of the day. A responsible individual must stay with you for 24 hours following the procedure.  For the next 24 hours, DO NOT: -Drive a car -Paediatric nurse -Drink alcoholic beverages -Take any medication unless instructed by your physician -Make any legal decisions or sign important papers.  Meals: Start with  liquid foods such as gelatin or soup. Progress to regular foods as tolerated. Avoid greasy, spicy, heavy foods. If nausea and/or vomiting occur, drink only clear liquids until the nausea and/or vomiting subsides. Call your physician if vomiting continues.  Special Instructions/Symptoms: Your throat may feel dry or sore from the anesthesia or the breathing tube placed in your throat during surgery. If this causes discomfort, gargle with warm salt water. The discomfort should disappear within 24 hours.  If you had a scopolamine patch placed behind your ear for the management of post- operative nausea and/or vomiting:  1. The medication in the patch is effective for 72 hours, after which it should be removed.  Wrap patch in a tissue and discard in the trash. Wash hands thoroughly with soap and water. 2. You may remove the patch earlier than 72 hours if you experience unpleasant side effects which may include dry mouth, dizziness or visual disturbances. 3. Avoid touching the patch. Wash your hands with soap and water after contact with the patch.    Regional Anesthesia Blocks  1. Numbness or the inability to move the "blocked" extremity may last from 3-48 hours after placement. The length of time depends on the medication injected and your individual response to the medication. If the numbness is not going away after 48 hours, call your surgeon.  2. The extremity that is blocked will need to be protected until the numbness is gone and the  Strength has returned. Because you cannot feel it, you will need to take extra care to avoid injury. Because it may be weak, you may have difficulty moving  it or using it. You may not know what position it is in without looking at it while the block is in effect.  3. For blocks in the legs and feet, returning to weight bearing and walking needs to be done carefully. You will need to wait until the numbness is entirely gone and the strength has returned. You should be  able to move your leg and foot normally before you try and bear weight or walk. You will need someone to be with you when you first try to ensure you do not fall and possibly risk injury.  4. Bruising and tenderness at the needle site are common side effects and will resolve in a few days.  5. Persistent numbness or new problems with movement should be communicated to the surgeon or the [facility] [phone] [place] [phone]).

## 2019-05-06 ENCOUNTER — Encounter (HOSPITAL_BASED_OUTPATIENT_CLINIC_OR_DEPARTMENT_OTHER): Payer: Self-pay | Admitting: Orthopaedic Surgery

## 2019-05-11 ENCOUNTER — Encounter (HOSPITAL_BASED_OUTPATIENT_CLINIC_OR_DEPARTMENT_OTHER): Payer: Self-pay | Admitting: Orthopaedic Surgery

## 2019-06-08 DIAGNOSIS — Z1231 Encounter for screening mammogram for malignant neoplasm of breast: Secondary | ICD-10-CM | POA: Diagnosis not present

## 2019-07-07 ENCOUNTER — Encounter: Payer: Self-pay | Admitting: Gastroenterology

## 2019-07-24 NOTE — Progress Notes (Addendum)
REVIEWED-NO ADDITIONAL RECOMMENDATIONS.  Referring Provider: Dr. Willey Blade Primary Care Physician:  Asencion Noble, MD Primary Gastroenterologist:  Dr. Oneida Alar  Chief Complaint  Patient presents with  . Abdominal Pain    mid upper abd; occ    HPI:   [name] is a 68 y.o. female presenting today at the request of Dr. Willey Blade for recurrent abdominal pain.   Reviewed labs and imaging sent with referral. Labs on 03/28/2019: CMP normal except for slightly elevated alkaline phosphatase at 128. CBC normal. Amylase low at 30 Abdominal ultrasound on 03/23/2019 with probable fatty infiltration of the liver.  Dilated CBD 11 mm diameter, abnormally prominent for age and prior cholecystectomy; recommend correlation with LFTs.  TCS in 2007: Normal exam. Due for repeat in 2017.  EGD 2011 with peptic stricture s/p dilation, mild gastritis likely NSAID related. No H. Pylori.   Today she states in May, for 2 weekends in a row she had epigastric pain. Skipped a couple weeks and started again. Sometimes in the morning. Other times at lunch. In her epigastric area, feels a tightening in this area. No identified triggers. Feels like it did before her cholecystectomy. Had a couple episodes of emesis initially, but none since. Thought she had a stomach bug at first. Last occurrence was a couple weeks ago. No nausea at this time. May last all day to a couple days. Reports a lot of acid reflux. About 4 times a week. Fried and spicy foods worsen symptoms. Also with burning in the epigastric area 2-3 times a week. Takes pepcid chewable. This helps her symptoms. Did not try anything to help with tightening sensation. No unintentional weight loss. Appetite is good. No black stool or blood in the stool. No other abdominal pain.   Occasional dysphagia. Usually meats or breads. About once a week.   Fell at [place] in March and dislocated her shoulder. Surgical repair on July 30th. Was taking a lot of ibuprofen after her  surgery. 400 mg BID for a week or so. Since then, rarely.   Denies fever, chills, lightheadedness, dizziness, feeling like she will pass out.  She needs repeat colonoscopy; however, will wait to schedule after her EGD to prevent further delay of evaluation of her upper GI symptoms.   Past Medical History:  Diagnosis Date  . GERD (gastroesophageal reflux disease)   . HTN (hypertension)   . Hyperlipidemia   . TIA (transient ischemic attack) 2004   no residual    Past Surgical History:  Procedure Laterality Date  . BREAST REDUCTION SURGERY    . CHOLECYSTECTOMY N/A 02/29/2016   Procedure: LAPAROSCOPIC CHOLECYSTECTOMY;  Surgeon: Aviva Signs, MD;  Location: [facility];  Service: General;  Laterality: N/A;  . COLONOSCOPY  04/13/2006   [id number] colon without evidence of polyps, masses, inflammatory changes diverticular or vascular ectasia/Normal retroflex view of the rectum  . ESOPHAGOGASTRODUODENOSCOPY  2011   peptic stricture s/p dialtion, miuld gastritis likely NSAID related. No H. pylori  . JOINT REPLACEMENT    . REPLACEMENT TOTAL KNEE Bilateral   . SHOULDER ARTHROSCOPY WITH ROTATOR CUFF REPAIR AND SUBACROMIAL DECOMPRESSION Left 05/05/2019   Procedure: Left Shoulder Arthroscopy and Rotator Cuff Repair ACROMIOPLASTY;  Surgeon: Melrose Nakayama, MD;  Location: [facility];  Service: Orthopedics;  Laterality: Left;    Current Outpatient Medications  Medication Sig Dispense Refill  . acetaminophen (TYLENOL) 500 MG tablet Take 500 mg by mouth every 6 (six) hours as needed.    Marland Kitchen aspirin 325 MG tablet Take 325  mg by mouth daily.    Marland Kitchen co-enzyme Q-10 30 MG capsule Take 30 mg by mouth 3 (three) times daily.    Marland Kitchen oxybutynin (DITROPAN) 5 MG tablet Take 5 mg by mouth 2 (two) times daily.     . ramipril (ALTACE) 2.5 MG capsule Take 2.5 mg by mouth daily.      . simvastatin (ZOCOR) 40 MG tablet Take 40 mg by mouth at bedtime.      . pantoprazole (PROTONIX) 40 MG tablet Take 1 tablet (40  mg total) by mouth daily before breakfast. 30 tablet 5   No current facility-administered medications for this visit.     Allergies as of 07/25/2019  . (No Known Allergies)    Family History  Problem Relation Age of Onset  . Congestive Heart Failure Mother   . Diabetes Mother   . Hypertension Mother   . Diabetes Father   . Osteoarthritis Father   . Colon cancer Neg Hx     Social History   Socioeconomic History  . Marital status: Married    Spouse name: Not on file  . Number of children: Not on file  . Years of education: Not on file  . Highest education level: Not on file  Occupational History  . Occupation: [employer]    Employer: [employer]  Social Needs  . Financial resource strain: Not on file  . Food insecurity    Worry: Not on file    Inability: Not on file  . Transportation needs    Medical: Not on file    Non-medical: Not on file  Tobacco Use  . Smoking status: Never Smoker  . Smokeless tobacco: Never Used  Substance and Sexual Activity  . Alcohol use: No  . Drug use: No  . Sexual activity: Not on file  Lifestyle  . Physical activity    Days per week: Not on file    Minutes per session: Not on file  . Stress: Not on file  Relationships  . Social Herbalist on phone: Not on file    Gets together: Not on file    Attends religious service: Not on file    Active member of club or organization: Not on file    Attends meetings of clubs or organizations: Not on file    Relationship status: Not on file  . Intimate partner violence    Fear of current or ex partner: Not on file    Emotionally abused: Not on file    Physically abused: Not on file    Forced sexual activity: Not on file  Other Topics Concern  . Not on file  Social History Narrative  . Not on file    Review of Systems: Gen: See HPI  HEENT: No cold or flu like symptoms.  CV: Denies chest pain, heart palpitations Resp: Denies shortness of breath or  cough GI: See HPI GU : Denies urinary burning, urinary frequency, urinary hesitancy MS: Left shoulder pain.  Derm: Denies rash Psych: Denies depression, anxiety Heme: Denies bruising, bleeding  Physical Exam: BP (!) 141/82   Pulse 63   Temp (!) 96.6 F (35.9 C) (Temporal)   Ht 5\' 9"  (1.753 m)   Wt 277 lb 6.4 oz (125.8 kg)   BMI 40.96 kg/m  General:   Alert and oriented. Pleasant and cooperative. Well-nourished and well-developed.  Head:  Normocephalic and atraumatic. Eyes:  Without icterus, sclera clear and conjunctiva pink.  Ears:  Normal  auditory acuity. Nose:  No deformity, discharge,  or lesions. Lungs:  Clear to auscultation bilaterally. No wheezes, rales, or rhonchi. No distress.  Heart:  S1, S2 present without murmurs appreciated.  Abdomen:  +BS, soft, and non-distended. Mild tenderness to palpation in the epigastric area. No HSM noted. No guarding or rebound. No masses appreciated.  Rectal:  Deferred  Msk:  Symmetrical without gross deformities. Normal posture.. Extremities:  Without edema. Neurologic:  Alert and  oriented x4;  grossly normal neurologically. Skin:  Intact without significant lesions or rashes. Psych:  Normal mood and affect.

## 2019-07-25 ENCOUNTER — Ambulatory Visit (INDEPENDENT_AMBULATORY_CARE_PROVIDER_SITE_OTHER): Payer: Medicare HMO | Admitting: Gastroenterology

## 2019-07-25 ENCOUNTER — Encounter: Payer: Self-pay | Admitting: Gastroenterology

## 2019-07-25 ENCOUNTER — Other Ambulatory Visit: Payer: Self-pay

## 2019-07-25 VITALS — BP 141/82 | HR 63 | Temp 96.6°F | Ht 69.0 in | Wt 277.4 lb

## 2019-07-25 DIAGNOSIS — R1013 Epigastric pain: Secondary | ICD-10-CM

## 2019-07-25 DIAGNOSIS — R1310 Dysphagia, unspecified: Secondary | ICD-10-CM

## 2019-07-25 DIAGNOSIS — R1319 Other dysphagia: Secondary | ICD-10-CM

## 2019-07-25 DIAGNOSIS — R748 Abnormal levels of other serum enzymes: Secondary | ICD-10-CM | POA: Diagnosis not present

## 2019-07-25 DIAGNOSIS — K219 Gastro-esophageal reflux disease without esophagitis: Secondary | ICD-10-CM

## 2019-07-25 MED ORDER — PANTOPRAZOLE SODIUM 40 MG PO TBEC: 40.0000 mg | DELAYED_RELEASE_TABLET | Freq: Every day | ORAL | 5 refills | Status: DC

## 2019-07-25 NOTE — Patient Instructions (Addendum)
Please have blood work completed when you can. I am rechecking your liver enzymes to ensure they are within normal limits as your alk phos was slightly elevated in June.   We will get you scheduled for upper endoscopy with possible dilation of your esophagus with Dr. Oneida Alar.   Please start Protonix 40 mg daily 30 minutes before breakfast.   Please follow a GERD diet. Avoid spicy, fatty, fried, and citrus foods. Avoid chocolate, carbonated beverages and caffeine. See handout below.   Avoid all NSAIDs which includes ibuprofen, Aleve, Advil, Goody Powders, and naproxen.   Avoid laying down within 3 hours of eating. Try eating 4-6 small meals a day rather than 3 large meals.   Aliene Altes, PA-C [facility]    Food Choices for Gastroesophageal Reflux Disease, Adult When you have gastroesophageal reflux disease (GERD), the foods you eat and your eating habits are very important. Choosing the right foods can help ease your discomfort. Think about working with a nutrition specialist (dietitian) to help you make good choices. What are tips for following this plan?  Meals  Choose healthy foods that are low in fat, such as fruits, vegetables, whole grains, low-fat dairy products, and lean meat, fish, and poultry.  Eat small meals often instead of 3 large meals a day. Eat your meals slowly, and in a place where you are relaxed. Avoid bending over or lying down until 2-3 hours after eating.  Avoid eating meals 2-3 hours before bed.  Avoid drinking a lot of liquid with meals.  Cook foods using methods other than frying. Bake, grill, or broil food instead.  Avoid or limit: ? Chocolate. ? Peppermint or spearmint. ? Alcohol. ? Pepper. ? Black and decaffeinated coffee. ? Black and decaffeinated tea. ? Bubbly (carbonated) soft drinks. ? Caffeinated energy drinks and soft drinks.  Limit high-fat foods such as: ? Fatty meat or fried foods. ? Whole milk, cream, butter, or  ice cream. ? Nuts and nut butters. ? Pastries, donuts, and sweets made with butter or shortening.  Avoid foods that cause symptoms. These foods may be different for everyone. Common foods that cause symptoms include: ? Tomatoes. ? Oranges, lemons, and limes. ? Peppers. ? Spicy food. ? Onions and garlic. ? Vinegar. Lifestyle  Maintain a healthy weight. Ask your doctor what weight is healthy for you. If you need to lose weight, work with your doctor to do so safely.  Exercise for at least 30 minutes for 5 or more days each week, or as told by your doctor.  Wear loose-fitting clothes.  Do not smoke. If you need help quitting, ask your doctor.  Sleep with the head of your bed higher than your feet. Use a wedge under the mattress or blocks under the bed frame to raise the head of the bed. Summary  When you have gastroesophageal reflux disease (GERD), food and lifestyle choices are very important in easing your symptoms.  Eat small meals often instead of 3 large meals a day. Eat your meals slowly, and in a place where you are relaxed.  Limit high-fat foods such as fatty meat or fried foods.  Avoid bending over or lying down until 2-3 hours after eating.  Avoid peppermint and spearmint, caffeine, alcohol, and chocolate. This information is not intended to replace advice given to you by your health care provider. Make sure you discuss any questions you have with your health care provider. Document Released: 03/23/2012 Document Revised: 01/13/2019 Document Reviewed: 10/28/2016 Elsevier Patient Education  [place].

## 2019-07-29 DIAGNOSIS — R748 Abnormal levels of other serum enzymes: Secondary | ICD-10-CM | POA: Insufficient documentation

## 2019-07-29 DIAGNOSIS — R1013 Epigastric pain: Secondary | ICD-10-CM | POA: Insufficient documentation

## 2019-07-29 DIAGNOSIS — Z23 Encounter for immunization: Secondary | ICD-10-CM | POA: Diagnosis not present

## 2019-07-29 NOTE — Assessment & Plan Note (Signed)
Dysphagia occurring about once a week.  Usually with meats or breads.  Associated with uncontrolled GERD and epigastric burning several times a week.  Taking Pepcid chewables.  Fried and spicy foods worsen GERD symptoms.  Denies BRBPR, melena, or unintentional weight loss.  History of peptic stricture s/p dilation in 2011.  Suspect possible esophageal stricture, web, or ring likely secondary to uncontrolled GERD.  Doubt malignancy.  Start Protonix 40 mg daily 30 minutes before breakfast. Follow GERD diet. Avoid all NSAIDs. Proceed with EGD with possible dilation with Dr. Oneida Alar in the near future. The risks, benefits, and alternatives have been discussed in detail with patient. They have stated understanding and desire to proceed. Follow-up after procedure.

## 2019-07-29 NOTE — Assessment & Plan Note (Addendum)
Epigastric burning 2-3 times a week. Associated with uncontrolled GERD and dysphagia. Takes Pepcid chewables which help.  Was taking ibuprofen 400 mg twice daily in August for a week or so after her shoulder surgery.  Since then, she rarely uses ibuprofen.  Also reporting an intermittent "tightening" sensation in the epigastric area that started in May.  States it feels similar to symptoms prior to her cholecystectomy.  No identified triggers.  Symptoms last all day to a couple days.  Associated with a couple episodes of emesis initially but none since.  Last occurrence a couple weeks ago. Denies melena, BRBPR, or unintentional weight loss. Labs on 03/28/2019 with mild elevation of alk phos at 128.  Otherwise CMP, CBC, and amylase normal.  Abdominal ultrasound on 03/23/2019 with probable fatty liver.  Dilated CBD 11 mm diameter.  Stated abnormally prominent for age and prior cholecystectomy.  Recommend correlation with LFTs.  Abdominal exam with mild tenderness to palpation in the epigastric.  No right-sided abdominal pain.   Possible gastritis, esophagitis, or duodenitis. Doubt PUD. Not sure if intermittent "tightening" sensation is related to underlying gastritis.  It is possible that she may be passing bile duct stones intermittently with the noted dilated CBD although her LFTs were not suggestive of this.  Start Protonix 40 mg daily 30 minutes before breakfast. Advised to follow a GERD diet.  Avoid fatty, fried, spicy, greasy, or citrus foods.  Avoid caffeine, carbonated beverages, and chocolate.  Do not lie down within 3 hours of eating.  Eat several small meals rather than 3 large meals a day.  Prop head of the bed up on wood or bricks to create a 6 inch incline.  GERD handout provided. Avoid NSAIDs. Recheck HFP and add GGT Proceed with EGD with possible dilation in the near future with Dr. Oneida Alar.The risks, benefits, and alternatives have been discussed in detail with patient. They have stated  understanding and desire to proceed.  Follow-up after procedure.

## 2019-07-29 NOTE — Assessment & Plan Note (Addendum)
Uncontrolled GERD.  Worsened with fried and spicy foods.  Associated with epigastric burning 2-3 times a week.  Takes Pepcid chewables which help.  Dysphagia to meats and breads about once a week.  Was taking ibuprofen 400 mg twice daily in August for a week or so after her shoulder surgery.  Since then, she rarely uses ibuprofen.  Also reporting an intermittent "tightening" sensation in the epigastric area that started in May.  States it feels similar to symptoms prior to her cholecystectomy.  No identified triggers.  Symptoms last all day to a couple days.  Associated with a couple episodes of emesis initially but none since.  Last occurrence a couple weeks ago. Denies melena, BRBPR, or unintentional weight loss. Labs on 03/28/2019 with mild elevation of alk phos at 128.  Otherwise CMP, CBC, and amylase normal.  Abdominal ultrasound on 03/23/2019 with probable fatty liver.  Dilated CBD 11 mm diameter.  Stated abnormally prominent for age and prior cholecystectomy.  Recommend correlation with LFTs.  Abdominal exam with mild tenderness to palpation in the epigastric.  No right-sided abdominal pain.   Suspect uncontrolled GERD symptoms are related to dietary habits and body habitus.  Possible gastritis, esophagitis, or duodenitis with reported epigastric pain. Doubt PUD.  Dysphagia may be secondary to esophageal stricture, web, or ring.  Doubt malignancy.  Not sure if intermittent "tightening" sensation is related to underlying gastritis.  It is possible that she may be passing bile duct stones intermittently with the noted dilated CBD although her LFTs were not suggestive of this.  Start Protonix 40 mg daily 30 minutes before breakfast. Advised to follow a GERD diet.  Avoid fatty, fried, spicy, greasy, or citrus foods.  Avoid caffeine, carbonated beverages, and chocolate.  Do not lie down within 3 hours of eating.  Eat several small meals rather than 3 large meals a day.  Prop head of the bed up on wood or bricks  to create a 6 inch incline.  GERD handout provided. Avoid NSAIDs. Proceed with EGD with possible dilation in the near future with Dr. Oneida Alar.The risks, benefits, and alternatives have been discussed in detail with patient. They have stated understanding and desire to proceed.  Follow-up after procedure.

## 2019-07-29 NOTE — Assessment & Plan Note (Signed)
Isolated elevation of alk phos at 128 on 03/28/2019.  Most recent labs in our system are in 2017 with normal LFTs.  She does admit to intermittent tightening sensation in her epigastric area that started in May.  States symptoms feel similar to symptoms she had prior to her cholecystectomy.  No identified triggers.  Symptoms last all day to a couple days.  Associated with a couple episodes of emesis initially but none since.  Last occurrence a couple weeks ago.  Also with uncontrolled GERD and epigastric burning as addressed above.  Ultrasound on 03/23/2019 with probable fatty liver and dilated CBD at 11 mm.  Stated this is abnormally prominent for age and prior cholecystectomy and recommended correlation with LFTs.   It is possible that she is intermittently passing CBD stones; however, her LFTs did not suggest this.  At this point I will recheck HFP and GGT.

## 2019-09-05 ENCOUNTER — Other Ambulatory Visit: Payer: Self-pay | Admitting: Gastroenterology

## 2019-09-05 ENCOUNTER — Telehealth: Payer: Self-pay | Admitting: Gastroenterology

## 2019-09-05 NOTE — Telephone Encounter (Signed)
Pt said her stools start out loose/mushy but if she has more than 2 stools they are watery after the first two.  Most of the time is is about 2 stools per day, though. She has not been having nocturnal stools and no recent antibiotics or sick contacts.  No fever or chills. She said the Protonix has helped with her reflux and epigastric burning.

## 2019-09-05 NOTE — Telephone Encounter (Signed)
Ok. We can try changing her Protonix to Nexium 20 mg daily to see if this helps with her diarrhea and keeps her reflux well controlled. She can also add a daily probiotic to see if this helps with her diarrhea.   Let me know if patient is agreeable to Nexium and I will send in a prescription.   She should also continue to monitor her diarrhea. If it worsens or doesn't improve, she should call back.

## 2019-09-05 NOTE — Telephone Encounter (Signed)
PT said she has been on the Protonix for about 4 weeks. She did well the first couple of weeks. For the last couple of weeks she has diarrhea right after she takes Protonix and eats. Cyril Mourning, please advise!

## 2019-09-05 NOTE — Telephone Encounter (Signed)
Are her stools watery or just loose/mushy? How many stools a day? Any nocturnal stools? Any recent antibiotics or sick contacts? Fever or chills?  How is her reflux/epigastric burning? Has Protonix helped with this?

## 2019-09-05 NOTE — Telephone Encounter (Signed)
Pt said she was seen by [facility] on 07/25/2019 and has been taking protonix. She said for the last 2 weeks she has had diarrhea. Please advise. [phone]

## 2019-09-06 ENCOUNTER — Other Ambulatory Visit: Payer: Self-pay | Admitting: Gastroenterology

## 2019-09-06 DIAGNOSIS — K219 Gastro-esophageal reflux disease without esophagitis: Secondary | ICD-10-CM

## 2019-09-06 MED ORDER — ESOMEPRAZOLE MAGNESIUM 20 MG PO CPDR: 20.0000 mg | DELAYED_RELEASE_CAPSULE | Freq: Every day | ORAL | 5 refills | Status: DC

## 2019-09-06 NOTE — Progress Notes (Signed)
Nexium sent to pharmacy. Discontinued Protonix due to diarrhea.

## 2019-09-06 NOTE — Telephone Encounter (Signed)
Pt is aware and OK to send in Nexium.

## 2019-09-06 NOTE — Telephone Encounter (Signed)
Left VM that Rx has been sent in  

## 2019-09-06 NOTE — Telephone Encounter (Signed)
Rx sent 

## 2019-10-26 ENCOUNTER — Other Ambulatory Visit (HOSPITAL_COMMUNITY)
Admission: RE | Admit: 2019-10-26 | Discharge: 2019-10-26 | Disposition: A | Discharge status: Home / Self Care | Payer: Medicare HMO | Source: Ambulatory Visit | Attending: Gastroenterology | Admitting: Gastroenterology

## 2019-10-26 ENCOUNTER — Other Ambulatory Visit: Payer: Self-pay

## 2019-10-26 DIAGNOSIS — Z01812 Encounter for preprocedural laboratory examination: Secondary | ICD-10-CM | POA: Diagnosis present

## 2019-10-26 DIAGNOSIS — Z20822 Contact with and (suspected) exposure to covid-19: Secondary | ICD-10-CM | POA: Insufficient documentation

## 2019-10-26 LAB — SARS CORONAVIRUS 2 (TAT 6-24 HRS): SARS Coronavirus 2: NEGATIVE

## 2019-10-28 ENCOUNTER — Encounter (HOSPITAL_COMMUNITY)
Admission: RE | Disposition: A | Discharge status: Home / Self Care | Payer: Self-pay | Source: Home / Self Care | Attending: Gastroenterology

## 2019-10-28 ENCOUNTER — Other Ambulatory Visit: Payer: Self-pay

## 2019-10-28 ENCOUNTER — Encounter (HOSPITAL_COMMUNITY): Payer: Self-pay | Admitting: Gastroenterology

## 2019-10-28 ENCOUNTER — Ambulatory Visit (HOSPITAL_COMMUNITY)
Admission: RE | Admit: 2019-10-28 | Discharge: 2019-10-28 | Disposition: A | Discharge status: Home / Self Care | Payer: Medicare HMO | Attending: Gastroenterology | Admitting: Gastroenterology

## 2019-10-28 DIAGNOSIS — R1013 Epigastric pain: Secondary | ICD-10-CM | POA: Diagnosis not present

## 2019-10-28 DIAGNOSIS — I10 Essential (primary) hypertension: Secondary | ICD-10-CM | POA: Diagnosis not present

## 2019-10-28 DIAGNOSIS — Z96653 Presence of artificial knee joint, bilateral: Secondary | ICD-10-CM | POA: Diagnosis not present

## 2019-10-28 DIAGNOSIS — K219 Gastro-esophageal reflux disease without esophagitis: Secondary | ICD-10-CM | POA: Diagnosis not present

## 2019-10-28 DIAGNOSIS — E785 Hyperlipidemia, unspecified: Secondary | ICD-10-CM | POA: Insufficient documentation

## 2019-10-28 DIAGNOSIS — Z8673 Personal history of transient ischemic attack (TIA), and cerebral infarction without residual deficits: Secondary | ICD-10-CM | POA: Insufficient documentation

## 2019-10-28 DIAGNOSIS — Z79899 Other long term (current) drug therapy: Secondary | ICD-10-CM | POA: Insufficient documentation

## 2019-10-28 DIAGNOSIS — K317 Polyp of stomach and duodenum: Secondary | ICD-10-CM | POA: Diagnosis not present

## 2019-10-28 DIAGNOSIS — Z7982 Long term (current) use of aspirin: Secondary | ICD-10-CM | POA: Diagnosis not present

## 2019-10-28 DIAGNOSIS — K2970 Gastritis, unspecified, without bleeding: Secondary | ICD-10-CM

## 2019-10-28 DIAGNOSIS — R1310 Dysphagia, unspecified: Secondary | ICD-10-CM | POA: Diagnosis not present

## 2019-10-28 HISTORY — PX: BIOPSY: SHX5522

## 2019-10-28 HISTORY — PX: SAVORY DILATION: SHX5439

## 2019-10-28 HISTORY — PX: ESOPHAGOGASTRODUODENOSCOPY: SHX5428

## 2019-10-28 SURGERY — EGD (ESOPHAGOGASTRODUODENOSCOPY)
Anesthesia: Moderate Sedation

## 2019-10-28 MED ORDER — ONDANSETRON HCL 4 MG/2ML IJ SOLN
INTRAMUSCULAR | Status: DC | PRN
  Administered 2019-10-28: 4 mg via INTRAVENOUS

## 2019-10-28 MED ORDER — ESOMEPRAZOLE MAGNESIUM 40 MG PO CPDR: DELAYED_RELEASE_CAPSULE | ORAL | 3 refills | Status: DC

## 2019-10-28 MED ORDER — MIDAZOLAM HCL 5 MG/5ML IJ SOLN
INTRAMUSCULAR | Status: AC
  Filled 2019-10-28: qty 10

## 2019-10-28 MED ORDER — SODIUM CHLORIDE 0.9 % IV SOLN: INTRAVENOUS | Status: DC

## 2019-10-28 MED ORDER — ONDANSETRON HCL 4 MG/2ML IJ SOLN
INTRAMUSCULAR | Status: AC
  Filled 2019-10-28: qty 2

## 2019-10-28 MED ORDER — LIDOCAINE VISCOUS HCL 2 % MT SOLN
OROMUCOSAL | Status: DC | PRN
  Administered 2019-10-28: 4 mL via OROMUCOSAL

## 2019-10-28 MED ORDER — MEPERIDINE HCL 100 MG/ML IJ SOLN
INTRAMUSCULAR | Status: DC | PRN
  Administered 2019-10-28: 25 mg via INTRAVENOUS
  Administered 2019-10-28: 50 mg via INTRAVENOUS

## 2019-10-28 MED ORDER — MIDAZOLAM HCL 5 MG/5ML IJ SOLN
INTRAMUSCULAR | Status: DC | PRN
  Administered 2019-10-28 (×2): 2 mg via INTRAVENOUS

## 2019-10-28 MED ORDER — LIDOCAINE VISCOUS HCL 2 % MT SOLN
OROMUCOSAL | Status: AC
  Filled 2019-10-28: qty 15

## 2019-10-28 MED ORDER — MINERAL OIL PO OIL
TOPICAL_OIL | ORAL | Status: AC
  Filled 2019-10-28: qty 30

## 2019-10-28 MED ORDER — STERILE WATER FOR IRRIGATION IR SOLN
Status: DC | PRN
  Administered 2019-10-28: 1.5 mL

## 2019-10-28 MED ORDER — MEPERIDINE HCL 100 MG/ML IJ SOLN
INTRAMUSCULAR | Status: AC
  Filled 2019-10-28: qty 2

## 2019-10-28 NOTE — Discharge Instructions (Signed)
I dilated your esophagus DUE TO YOUR PROBLEM SWALLOWING. I DID NOT SEE A DEFINITE STRICTURE. IT MAY BE DUE TO UNCONTROLLED REFLUX. You have gastritis. YOU HAD AND ONE SMALL STOMACH POLYP REMOVED. I biopsied your stomach.  EAT TO LIVE AND THINK OF FOOD AS MEDICINE. 75% OF YOUR PLATE SHOULD BE FRUITS/VEGGIES.  To have more energy, LESS PROBLEMS SWALLOWING, and to lose weight:      1. CONTINUE YOUR WEIGHT LOSS EFFORTS. I RECOMMEND YOU READ AND FOLLOW RECOMMENDATIONS BY DR. MARK HYMAN, "10-DAY DETOX DIET".    2. If you must eat bread, EAT EZEKIEL BREAD. IT IS IN THE FROZEN SECTION OF THE GROCERY STORE.    3. DRINK WATER WITH FRUIT OR CUCUMBER ADDED. YOUR URINE SHOULD BE LIGHT YELLOW. AVOID SODA, GATORADE, ENERGY DRINKS, OR DIET SODA.     4. AVOID HIGH FRUCTOSE CORN SYRUP AND CAFFEINE.     5. DO NOT chew SUGAR FREE GUM OR USE ARTIFICIAL SWEETENERS. IF NEEDED USE STEVIA AS A SWEETENER.    6. DO NOT EAT ENRICHED WHEAT FLOUR, PASTA, RICE, OR CEREAL.    7. ONLY EAT WILD CAUGHT SEAFOOD, GRASS FED BEEF OR CHICKEN, PORK FROM PASTURE RAISE PIGS, OR EGGS FROM PASTURE RAISED CHICKENS.    8. PRACTICE CHAIR YOGA FOR 15-30 MINS 3 OR 4 TIMES A WEEK AND PROGRESS TO HATHA YOGA OVER NEXT 6 MOS.    9. START TAKING A MULTIVITAMIN, VITAMIN B12, AND VITAMIN D3 2000 IU DAILY.   ADDITIONAL SUPPLEMENTS TO DECREASE CRAVING AND SUPPRESS YOUR APPETITE:    1. CINNAMON 500 MG EVERY AM PRIOR TO FIRST MEAL.   **STABILIZES BLOOD GLUCOSE/REDUCES CRAVINGS**    2. CHROMIUM 400-500 MG WITH MEALS TWICE DAILY.    **FAT BURNER**    3. GREEN TEA EXTRACT ONE DAILY.   **FAT BURNER/SUPPRESSES YOUR APPETITE**    4. ALPHA LIPOIC ACID TWICE DAILY.   **NATURAL ANTI-INFLAMMATORY SUPPLEMENT THAT IS AN ALTERNATIVE TO IBUPROFEN OR NAPROXEN**   CONTINUE NEXIUM. TAKE 30 MINUTES BEFORE FIRST MEAL DAILY.  YOUR BIOPSY RESULTS WILL BE BACK IN 5 BUSINESS DAYS.  FOLLOW UP IN 4 MOS.  UPPER ENDOSCOPY AFTER CARE Read the instructions  outlined below and refer to this sheet in the next week. These discharge instructions provide you with general information on caring for yourself after you leave the hospital. While your treatment has been planned according to the most current medical practices available, unavoidable complications occasionally occur. If you have any problems or questions after discharge, call DR. Jaszmine Navejas, [phone].  ACTIVITY  You may resume your regular activity, but move at a slower pace for the next 24 hours.   Take frequent rest periods for the next 24 hours.   Walking will help get rid of the air and reduce the bloated feeling in your belly (abdomen).   No driving for 24 hours (because of the medicine (anesthesia) used during the test).   You may shower.   Do not sign any important legal documents or operate any machinery for 24 hours (because of the anesthesia used during the test).    NUTRITION  Drink plenty of fluids.   You may resume your normal diet as instructed by your doctor.   Begin with a light meal and progress to your normal diet. Heavy or fried foods are harder to digest and may make you feel sick to your stomach (nauseated).   Avoid alcoholic beverages for 24 hours or as instructed.    MEDICATIONS  You may resume your  normal medications.   WHAT YOU CAN EXPECT TODAY  Some feelings of bloating in the abdomen.   Passage of more gas than usual.    IF YOU HAD A BIOPSY TAKEN DURING THE UPPER ENDOSCOPY:  Eat a soft diet IF YOU HAVE NAUSEA, BLOATING, ABDOMINAL PAIN, OR VOMITING.    FINDING OUT THE RESULTS OF YOUR TEST Not all test results are available during your visit. DR. Oneida Alar WILL CALL YOU WITHIN 7 DAYS OF YOUR PROCEDUE WITH YOUR RESULTS. Do not assume everything is normal if you have not heard from DR. Desman Polak IN ONE WEEK, CALL HER OFFICE AT [phone].  SEEK IMMEDIATE MEDICAL ATTENTION AND CALL THE OFFICE: [phone] IF:  You have more than a spotting of blood  in your stool.   Your belly is swollen (abdominal distention).   You are nauseated or vomiting.   You have a temperature over 101F.   You have abdominal pain or discomfort that is severe or gets worse throughout the day.  Gastritis  Gastritis is an inflammation (the body's way of reacting to injury and/or infection) of the stomach. It is often caused by viral or bacterial (germ) infections. It can also be caused BY ASPIRIN, BC/GOODY POWDER'S, (IBUPROFEN) MOTRIN, OR ALEVE (NAPROXEN), chemicals (including alcohol), SPICY FOODS, and medications. This illness may be associated with generalized malaise (feeling tired, not well), UPPER ABDOMINAL STOMACH cramps, and fever. One common bacterial cause of gastritis is an organism known as H. Pylori. This can be treated with antibiotics.    DYSPHAGIA DYSPHAGIA can be caused by stomach acid backing up into the tube that carries food from the mouth down to the stomach (lower esophagus).  TREATMENT There are a number of  medicines used to treat reflux/stricture, including: Antacids.  Proton-pump inhibitors: NEXIUM PEPCID OR TAGAMET   Lifestyle and home remedies TO MANAGE REFLUX/CHEST PAIN  You may eliminate or reduce the frequency of heartburn by making the following lifestyle changes:  . Control your weight. Being overweight is a major risk factor for heartburn and GERD. Excess pounds put pressure on your abdomen, pushing up your stomach and causing acid to back up into your esophagus.   . Eat smaller meals. 4 TO 6 MEALS A DAY. This reduces pressure on the lower esophageal sphincter, helping to prevent the valve from opening and acid from washing back into your esophagus.   Dolphus Jenny your belt. Clothes that fit tightly around your waist put pressure on your abdomen and the lower esophageal sphincter.   . Eliminate heartburn triggers. Everyone has specific triggers. Common triggers such as fatty or fried foods, spicy food, tomato sauce,  carbonated beverages, alcohol, chocolate, mint, garlic, onion, caffeine and nicotine may make heartburn worse.   Marland Kitchen Avoid stooping or bending. Tying your shoes is OK. Bending over for longer periods to weed your garden isn't, especially soon after eating.   . Don't lie down after a meal. Wait at least three to four hours after eating before going to bed, and don't lie down right after eating.   . IF YOU HAVE TROUBLE WITH REFLUX AT NIGHT, SLEEP WITH ON A WEDGE OR PUT THE HEAD OF YOUR BED ON 6 INCH BLOCKS TO KEEP YOUR HEAD ABOVE YOUR HEART WHILE YOU SLEEP.    Alternative medicine . Several home remedies exist for treating GERD, but they provide only temporary relief. They include drinking baking soda (sodium bicarbonate) added to water or drinking other fluids such as baking soda mixed with cream of tartar and  water.  . Although these liquids create temporary relief by neutralizing, washing away or buffering acids, eventually they aggravate the situation by adding gas and fluid to your stomach, increasing pressure and causing more acid reflux. Further, adding more sodium to your diet may increase your blood pressure and add stress to your heart, and excessive bicarbonate ingestion can alter the acid-base balance in your body.

## 2019-10-28 NOTE — H&P (Signed)
Primary Care Physician:  Asencion Noble, MD Primary Gastroenterologist:  Dr. Oneida Alar  Pre-Procedure History & Physical: HPI:  [name] is a 69 y.o. female here for [employer]  Past Medical History:  Diagnosis Date  . GERD (gastroesophageal reflux disease)   . HTN (hypertension)   . Hyperlipidemia   . TIA (transient ischemic attack) 2004   no residual    Past Surgical History:  Procedure Laterality Date  . BREAST REDUCTION SURGERY    . CHOLECYSTECTOMY N/A 02/29/2016   Procedure: LAPAROSCOPIC CHOLECYSTECTOMY;  Surgeon: Aviva Signs, MD;  Location: [facility];  Service: General;  Laterality: N/A;  . COLONOSCOPY  04/13/2006   [id number] colon without evidence of polyps, masses, inflammatory changes diverticular or vascular ectasia/Normal retroflex view of the rectum  . ESOPHAGOGASTRODUODENOSCOPY  2011   peptic stricture s/p dialtion, miuld gastritis likely NSAID related. No H. pylori  . JOINT REPLACEMENT    . REPLACEMENT TOTAL KNEE Bilateral   . SHOULDER ARTHROSCOPY WITH ROTATOR CUFF REPAIR AND SUBACROMIAL DECOMPRESSION Left 05/05/2019   Procedure: Left Shoulder Arthroscopy and Rotator Cuff Repair ACROMIOPLASTY;  Surgeon: Melrose Nakayama, MD;  Location: [facility];  Service: Orthopedics;  Laterality: Left;    Prior to Admission medications   Medication Sig Start Date End Date Taking? Authorizing Provider  acetaminophen (TYLENOL) 500 MG tablet Take 1,000 mg by mouth every 6 (six) hours as needed (for pain.).    Yes [provider]  aspirin 325 MG tablet Take 325 mg by mouth at bedtime.    Yes [provider]  Coenzyme Q10 (COQ10) 200 MG CAPS Take 200 mg by mouth at bedtime.   Yes [provider]  esomeprazole (NEXIUM) 20 MG capsule Take 1 capsule (20 mg total) by mouth daily before breakfast. 09/06/19 09/05/20 Yes Jodi Mourning, Tivis Ringer, PA-C  oxybutynin (DITROPAN) 5 MG tablet Take 5 mg by mouth at bedtime.    Yes [provider]   ramipril (ALTACE) 2.5 MG capsule Take 2.5 mg by mouth daily.     Yes [provider]  simvastatin (ZOCOR) 40 MG tablet Take 40 mg by mouth at bedtime.     Yes [provider]    Allergies as of 07/25/2019  . (No Known Allergies)    Family History  Problem Relation Age of Onset  . Congestive Heart Failure Mother   . Diabetes Mother   . Hypertension Mother   . Diabetes Father   . Osteoarthritis Father   . Colon cancer Neg Hx     Social History   Socioeconomic History  . Marital status: Married    Spouse name: Not on file  . Number of children: Not on file  . Years of education: Not on file  . Highest education level: Not on file  Occupational History  . Occupation: [place]    Employer: [employer]  Tobacco Use  . Smoking status: Never Smoker  . Smokeless tobacco: Never Used  Substance and Sexual Activity  . Alcohol use: No  . Drug use: No  . Sexual activity: Not on file  Other Topics Concern  . Not on file  Social History Narrative  . Not on file   Social Determinants of Health   Financial Resource Strain:   . Difficulty of Paying Living Expenses: Not on file  Food Insecurity:   . Worried About Charity fundraiser in the Last Year: Not on file  . Ran Out of Food in the Last Year: Not  on file  Transportation Needs:   . Lack of Transportation (Medical): Not on file  . Lack of Transportation (Non-Medical): Not on file  Physical Activity:   . Days of Exercise per Week: Not on file  . Minutes of Exercise per Session: Not on file  Stress:   . Feeling of Stress : Not on file  Social Connections:   . Frequency of Communication with Friends and Family: Not on file  . Frequency of Social Gatherings with Friends and Family: Not on file  . Attends Religious Services: Not on file  . Active Member of Clubs or Organizations: Not on file  . Attends Archivist Meetings: Not on file  . Marital Status: Not on file   Intimate Partner Violence:   . Fear of Current or Ex-Partner: Not on file  . Emotionally Abused: Not on file  . Physically Abused: Not on file  . Sexually Abused: Not on file    Review of Systems: See HPI, otherwise negative ROS   Physical Exam: There were no vitals taken for this visit. General:   Alert,  pleasant and cooperative in NAD Head:  Normocephalic and atraumatic. Neck:  Supple; Lungs:  Clear throughout to auscultation.    Heart:  Regular rate and rhythm. Abdomen:  Soft, nontender and nondistended. Normal bowel sounds, without guarding, and without rebound.   Neurologic:  Alert and  oriented x4;  grossly normal neurologically.  Impression/Plan:    DYSPHAGIA/dyspepsia  PLAN:  EGD/DIL TODAY. DISCUSSED PROCEDURE, BENEFITS, & RISKS: < 1% chance of medication reaction, bleeding, OR perforation.

## 2019-10-28 NOTE — Op Note (Signed)
[facility] Patient Name: [name] Procedure Date: 10/28/2019 10:00 AM MRN: [id number] Date of Birth: March 20, 1951 Attending MD: Barney Drain MD, MD CSN: [id number] Age: 69 Admit Type: Outpatient Procedure:                Upper GI endoscopy WITH COLD FORCEPS                            BIOPSY/ESOPHAGEAL DILATION Indications:              Dyspepsia, Dysphagia Providers:                Barney Drain MD, MD, Otis Peak B. Sharon Seller, RN, Nelma Rothman, Technician Referring MD:             Asencion Noble Medicines:                Meperidine 75 mg IV, Midazolam 4 mg IV, Ondansetron                            4 mg IV Complications:            No immediate complications. Estimated Blood Loss:     Estimated blood loss was minimal. Procedure:                Pre-Anesthesia Assessment:                           - Prior to the procedure, a History and Physical                            was performed, and patient medications and                            allergies were reviewed. The patient's tolerance of                            previous anesthesia was also reviewed. The risks                            and benefits of the procedure and the sedation                            options and risks were discussed with the patient.                            All questions were answered, and informed consent                            was obtained. Prior Anticoagulants: The patient has                            taken no previous anticoagulant or antiplatelet  agents except for aspirin. ASA Grade Assessment: II                            - A patient with mild systemic disease. After                            reviewing the risks and benefits, the patient was                            deemed in satisfactory condition to undergo the                            procedure. After obtaining informed consent, the                            endoscope was passed  under direct vision.                            Throughout the procedure, the patient's blood                            pressure, pulse, and oxygen saturations were                            monitored continuously. The [id number]                            scope was introduced through the mouth, and                            advanced to the second part of duodenum. The upper                            GI endoscopy was accomplished without difficulty.                            The patient tolerated the procedure well. Scope In: 10:21:25 AM Scope Out: 10:29:26 AM Total Procedure Duration: 0 hours 8 minutes 1 second  Findings:      No endoscopic abnormality was evident in the esophagus to explain the       patient's complaint of dysphagia. It was decided, however, to proceed       with dilation of the entire esophagus DUE TO POSSIBLE OCCULT ESOPHAGEAL       WEB. A guidewire was placed and the scope was withdrawn. Dilation was       performed with a Savary dilator with mild resistance at 16 mm and 17 mm.      A single 3 mm sessile polyp with no stigmata of recent bleeding was       found in the cardia. The polyp was removed with a cold biopsy forceps.       Resection and retrieval were complete.      Localized mild inflammation characterized by congestion (edema) and       erythema was found in the gastric antrum. Biopsies(5) were taken with a  cold forceps for histology.      The examined duodenum was normal. Impression:               - No endoscopic esophageal abnormality to explain                            patient's dysphagia MOST LIKELY DUE TO UNCONTROLLED                            REFLUX. Esophagus dilated. Dilated.                           - A single gastric polyp. Resected and retrieved.                           - MILD Gastritis. Biopsied. Moderate Sedation:      Moderate (conscious) sedation was administered by the endoscopy nurse       and supervised by the  endoscopist. The following parameters were       monitored: oxygen saturation, heart rate, blood pressure, and response       to care. Total physician intraservice time was 18 minutes. Recommendation:           - Patient has a contact number available for                            emergencies. The signs and symptoms of potential                            delayed complications were discussed with the                            patient. Return to normal activities tomorrow.                            Written discharge instructions were provided to the                            patient.                           - High fiber diet.                           - Continue present medications.                           - Await pathology results.                           - Return to my office in 4 months. Procedure Code(s):        --- Professional ---                           [phone], Esophagogastroduodenoscopy, flexible,  transoral; with insertion of guide wire followed by                            passage of dilator(s) through esophagus over guide                            wire                           43239, 59, Esophagogastroduodenoscopy, flexible,                            transoral; with biopsy, single or multiple                           G0500, Moderate sedation services provided by the                            same physician or other qualified health care                            professional performing a gastrointestinal                            endoscopic service that sedation supports,                            requiring the presence of an independent trained                            observer to assist in the monitoring of the                            patient's level of consciousness and physiological                            status; initial 15 minutes of intra-service time;                            patient age 26 years or older (additional  time may                            be reported with [phone], as appropriate) Diagnosis Code(s):        --- Professional ---                           R13.10, Dysphagia, unspecified                           K31.7, Polyp of stomach and duodenum                           K29.70, Gastritis, unspecified, without bleeding                           R10.13,  Epigastric pain CPT copyright 2019 American Medical Association. All rights reserved. The codes documented in this report are preliminary and upon coder review may  be revised to meet current compliance requirements. Barney Drain, MD Barney Drain MD, MD 10/28/2019 10:49:24 AM This report has been signed electronically. Number of Addenda: 0

## 2019-10-31 LAB — SURGICAL PATHOLOGY

## 2019-11-02 ENCOUNTER — Telehealth: Payer: Self-pay

## 2019-11-02 NOTE — Telephone Encounter (Signed)
T/C from [employer] asking if there was some reason the pt could not take Omeprazole.  She is faxing paperwork over to review.

## 2019-11-03 ENCOUNTER — Telehealth: Payer: Self-pay | Admitting: Gastroenterology

## 2019-11-03 NOTE — Telephone Encounter (Signed)
OV MADE °

## 2019-11-03 NOTE — Telephone Encounter (Signed)
PLEASE CALL PT. SHE HAD BENIGN POLYPS REMOVED FROM HER STOMACH. SHE DOES NOT HAVE H PYLORI GASTRITIS.  DRINK WATER TO KEEP YOUR URINE LIGHT YELLOW. CONTINUE YOUR WEIGHT LOSS EFFORTS FOLLOWING RECOMMENDATIONS FROM DR. MARK HYMAN. CONTINUE NEXIUM. TAKE 30 MINUTES BEFORE YOUR FIRST MEAL DAILY. FOLLOW UP IN 4 MOS.

## 2019-11-04 ENCOUNTER — Other Ambulatory Visit: Payer: Self-pay | Admitting: Gastroenterology

## 2019-11-04 ENCOUNTER — Telehealth: Payer: Self-pay | Admitting: Gastroenterology

## 2019-11-04 DIAGNOSIS — R197 Diarrhea, unspecified: Secondary | ICD-10-CM

## 2019-11-04 MED ORDER — DICYCLOMINE HCL 10 MG PO CAPS: 10.0000 mg | ORAL_CAPSULE | Freq: Three times a day (TID) | ORAL | 1 refills | Status: DC

## 2019-11-04 NOTE — Telephone Encounter (Signed)
Pt was checking to see if her results were back from her procedure. [phone]

## 2019-11-04 NOTE — Telephone Encounter (Signed)
See result note. Pt is aware.  

## 2019-11-04 NOTE — Telephone Encounter (Signed)
I called and informed [name].   She said this week she has had diarrhea after every meal for three days even if she ate chicken noodle soup.  She thinks some it was coming from stress.  Her father is in nursing home and being moved and she works part time at [employer] and everything has been stressful.  The only time she has the diarrhea is when she eats.  She is aware that Dr. Oneida Alar is on vacation and I will route this to Aliene Altes, who saw her in the office.  She is aware Cyril Mourning is seeing [name] and we leave at noon today.

## 2019-11-04 NOTE — Telephone Encounter (Signed)
Spoke with patient. She is having loose/mushy stools after eating for the last 3 days. 3 stools a day. Not watery. No nocturnal symptoms. Abdominal cramping prior to BM that resolves thereafter. No blood in the stool or black stools. Recent increase in stress. Similar symptoms have occurred in the past. Prior to diarrhea, was having 1 BM a day. No recent antibiotics.   Suspect IBS-D related to stress. Sending in Bentyl 10 mg up to 3 times daily with meals and at bedtime as needed. Advised of side effects including dry mouth, dizziness, and constipation. She will call on Monday with a progress report.   [name]

## 2019-11-08 MED ORDER — OMEPRAZOLE 20 MG PO CPDR: DELAYED_RELEASE_CAPSULE | ORAL | 3 refills | Status: DC

## 2019-11-08 NOTE — Addendum Note (Signed)
Addended by: Danie Binder on: 11/08/2019 05:17 PM   Modules accepted: Orders

## 2019-11-08 NOTE — Telephone Encounter (Signed)
PLEASE CALL PT. SENT OMEPRAZOLE WITH REFILLS x 1 YEAR.

## 2019-11-08 NOTE — Telephone Encounter (Signed)
Great.  Thanks

## 2019-11-08 NOTE — Telephone Encounter (Signed)
I called pt and she said she is doing better, The Bentyl is working and no side effects.

## 2019-11-08 NOTE — Telephone Encounter (Signed)
Fax received from [facility] that Esomeprazole is not covered.  Current formulary alternative is Omeprazole.    Dr. Oneida Alar, please advise!

## 2019-11-09 NOTE — Telephone Encounter (Signed)
LMOM to call.

## 2019-11-09 NOTE — Telephone Encounter (Signed)
Pt is aware. If this does not work, she will let us know.

## 2019-11-10 ENCOUNTER — Telehealth: Payer: Self-pay

## 2019-11-10 NOTE — Telephone Encounter (Signed)
Pt said she has only tried the Protonix and she kept diarrhea all the time. She said she will try the Omeprazole and let us know how it does.

## 2019-11-10 NOTE — Telephone Encounter (Signed)
REVIEWED-NO ADDITIONAL RECOMMENDATIONS. 

## 2019-11-10 NOTE — Telephone Encounter (Signed)
PLEASE CALL PT. WE NEED TO KNOW OF SHE HAS TRIED OMEPRAZOLE AND HAS IT FAILED TO CONTROL HER SYMPTOMS?

## 2019-11-10 NOTE — Telephone Encounter (Signed)
Pt was aware the Omeprazole was sent in yesterday.  She would like for [place] to see if she can have a tier exception.  [facility] form on Dr. Nona Dell desk to review.

## 2019-11-23 ENCOUNTER — Ambulatory Visit: Payer: Medicare HMO | Attending: Internal Medicine

## 2019-11-23 DIAGNOSIS — Z23 Encounter for immunization: Secondary | ICD-10-CM | POA: Insufficient documentation

## 2019-11-23 NOTE — Progress Notes (Signed)
   Covid-19 Vaccination Clinic  Name:  [name]    MRN: [id number] DOB: Mar 12, 1951  11/23/2019  [name] was observed post Covid-19 immunization for 15 minutes without incidence. She was provided with Vaccine Information Sheet and instruction to access the V-Safe system.   [name] was instructed to call 911 with any severe reactions post vaccine: Marland Kitchen Difficulty breathing  . Swelling of your face and throat  . A fast heartbeat  . A bad rash all over your body  . Dizziness and weakness    Immunizations Administered    Name Date Dose VIS Date Route   Moderna COVID-19 Vaccine 11/23/2019  2:00 PM 0.5 mL 09/06/2019 Intramuscular   Manufacturer: Moderna   Lot: [id number]   Airport [id number]

## 2019-12-21 ENCOUNTER — Ambulatory Visit: Payer: Medicare HMO | Attending: Internal Medicine

## 2019-12-21 DIAGNOSIS — Z23 Encounter for immunization: Secondary | ICD-10-CM

## 2019-12-21 NOTE — Progress Notes (Signed)
   Covid-19 Vaccination Clinic  Name:  [name]    MRN: [id number] DOB: 01-03-1951  12/21/2019  [name] was observed post Covid-19 immunization for 15 minutes without incident. She was provided with Vaccine Information Sheet and instruction to access the V-Safe system.   [name] was instructed to call 911 with any severe reactions post vaccine: Marland Kitchen Difficulty breathing  . Swelling of face and throat  . A fast heartbeat  . A bad rash all over body  . Dizziness and weakness   Immunizations Administered    Name Date Dose VIS Date Route   Moderna COVID-19 Vaccine 12/21/2019  1:32 PM 0.5 mL 09/06/2019 Intramuscular   Manufacturer: Moderna   Lot: [id number]   AydenBE:[id number]

## 2020-02-21 NOTE — Progress Notes (Signed)
Primary Care Physician:  Asencion Noble, MD  Primary GI: Dr. Oneida Alar  Patient Location: Home   Provider Location: [facility] office   Reason for Visit: Follow-up   Persons present on the virtual encounter, with roles:  Aliene Altes, PA-C (provider), [name] (patient)   Total time (minutes) spent on medical discussion: 17 minutes  Virtual Visit via Telephone Note Due to COVID-19, visit is conducted virtually and was requested by patient.   I connected with [name] on 02/23/20 at 10:00 AM EDT by telephone and verified that I am speaking with the correct person using two identifiers.   I discussed the limitations, risks, security and privacy concerns of performing an evaluation and management service by telephone and the availability of in person appointments. I also discussed with the patient that there may be a patient responsible charge related to this service. The patient expressed understanding and agreed to proceed.  Chief Complaint  Patient presents with  . Gastroesophageal Reflux    doing better, swallowing better since procedure     History of Present Illness: 69 year old female presenting for follow-up of epigastric abdominal pain, GERD, dysphagia, and elevated alk phos. She was seen in our office on 08/04/2019 which was her initial consult for the same.  Of note, patient is also overdue for colonoscopy.  Last colonoscopy in 2007 with normal exam.  At the time of her last office visit, GERD was uncontrolled with associated epigastric burning 2-3 times a week and dysphagia only taking Pepcid chewables.  Regarding elevated alk phos, this was noted on labs in June 2020, other LFTs within normal limits.  Ultrasound with probable fatty liver and dilated CBD at 11 mm.  She did report intermittent tightening sensation in the epigastric area back in May 2020 that felt similar to symptoms prior to cholecystectomy initially with a couple episodes of emesis but this had resolved.  She  is started on Protonix 40 mg daily, scheduled for EGD with possible dilation, and ordered HFP and GGT.  HFP and GGT were not completed.  EGD 10/28/2019: Normal-appearing esophagus s/p empiric dilation, single gastric polyp resected and retrieved, mild gastritis s/p biopsy.  Suspected dysphagia is most likely due to uncontrolled GERD.  Pathology revealed fundic gland polyp. She was to continue her current medications.  She was on Nexium at that time and ultimately this was switched to omeprazole due to insurance not covering Nexium.  Previously, Protonix had caused diarrhea.  Telephone call 11/04/2019.  I spoke with patient and she reported having loose/mushy stool after eating for the last 3 days.  3 stools a days.  Not watery.  No nocturnal symptoms.  Abdominal cramping prior to BMs that resolves thereafter.  No blood in the stool or black stool.  Reported increased stress with similar symptoms in the past.  Prior to diarrhea, was having 1 BM a day.  No recent antibiotics.  Suspected IBS related to stress.  Sent in a prescription for Bentyl 10 mg up to 3 times daily with meals and at bedtime as needed.  Follow-up call on 11/08/2019 with patient reporting she was doing much better.  Today:  Unable to connect to video due to her Wi-Fi being out.  She is okay with proceeding with a telephone visit.  GERD: Improved. Taking omeprazole 20 mg daily. No breakthrough symptoms. Trying to limit fried foods. Avoiding salads as well.   Dyspepsia: Resolved. No abdominal pain at this time. No NSAIDs other than 325 mg aspirin.  Dysphagia: Resolved.  Diarrhea: Taking Bentyl as needed. Maybe once every other week. Diarrhea is rare at this point. BMs daily. Typically, soft and formed. Sometimes lower abdominal cramping right before diarrhea. Notes dairy is a trigger of diarrhea. Spicy foods or lettuce will also cause diarrhea.   Colon cancer screening: Wants to hold off on colonoscopy at this time. No blood in the  stool or black stool.    Past Medical History:  Diagnosis Date  . GERD (gastroesophageal reflux disease)   . HTN (hypertension)   . Hyperlipidemia   . TIA (transient ischemic attack) 2004   no residual     Past Surgical History:  Procedure Laterality Date  . BIOPSY  10/28/2019   Procedure: BIOPSY;  Surgeon: Danie Binder, MD;  Location: [facility];  Service: Endoscopy;;  gastric   . BREAST REDUCTION SURGERY    . CHOLECYSTECTOMY N/A 02/29/2016   Procedure: LAPAROSCOPIC CHOLECYSTECTOMY;  Surgeon: Aviva Signs, MD;  Location: [facility];  Service: General;  Laterality: N/A;  . COLONOSCOPY  04/13/2006   [id number] colon without evidence of polyps, masses, inflammatory changes diverticular or vascular ectasia/Normal retroflex view of the rectum  . ESOPHAGOGASTRODUODENOSCOPY  2011   peptic stricture s/p dialtion, miuld gastritis likely NSAID related. No H. pylori  . ESOPHAGOGASTRODUODENOSCOPY N/A 10/28/2019   Procedure: ESOPHAGOGASTRODUODENOSCOPY (EGD);  Surgeon: Danie Binder, MD; Normal-appearing esophagus s/p empiric dilation, single gastric polyp resected and retrieved, mild gastritis s/p biopsy.  Pathology with fundic gland polyp.  Marland Kitchen JOINT REPLACEMENT    . REPLACEMENT TOTAL KNEE Bilateral   . SAVORY DILATION N/A 10/28/2019   Procedure: SAVORY DILATION;  Surgeon: Danie Binder, MD;  Location: [facility];  Service: Endoscopy;  Laterality: N/A;  . SHOULDER ARTHROSCOPY WITH ROTATOR CUFF REPAIR AND SUBACROMIAL DECOMPRESSION Left 05/05/2019   Procedure: Left Shoulder Arthroscopy and Rotator Cuff Repair ACROMIOPLASTY;  Surgeon: Melrose Nakayama, MD;  Location: [place];  Service: Orthopedics;  Laterality: Left;     Current Meds  Medication Sig  . acetaminophen (TYLENOL) 500 MG tablet Take 1,000 mg by mouth every 6 (six) hours as needed (for pain.).   Marland Kitchen aspirin 325 MG tablet Take 325 mg by mouth at bedtime.   . Coenzyme Q10 (COQ10) 200 MG CAPS Take 200 mg by mouth at  bedtime.  . dicyclomine (BENTYL) 10 MG capsule Take 1 capsule (10 mg total) by mouth 4 (four) times daily -  before meals and at bedtime. (Patient taking differently: Take 10 mg by mouth as needed. )  . omeprazole (PRILOSEC) 20 MG capsule 1 PO 30 MINS PRIOR TO BREAKFAST.  Marland Kitchen oxybutynin (DITROPAN) 5 MG tablet Take 5 mg by mouth at bedtime.   . ramipril (ALTACE) 2.5 MG capsule Take 2.5 mg by mouth daily.    . simvastatin (ZOCOR) 40 MG tablet Take 40 mg by mouth at bedtime.       Family History  Problem Relation Age of Onset  . Congestive Heart Failure Mother   . Diabetes Mother   . Hypertension Mother   . Diabetes Father   . Osteoarthritis Father   . Colon cancer Neg Hx     Social History   Socioeconomic History  . Marital status: Married    Spouse name: Not on file  . Number of children: Not on file  . Years of education: Not on file  . Highest education level: Not on file  Occupational History  . Occupation: [place]    Employer: [employer]  Tobacco Use  . Smoking status: Never Smoker  . Smokeless tobacco: Never Used  Substance and Sexual Activity  . Alcohol use: No  . Drug use: No  . Sexual activity: Not on file  Other Topics Concern  . Not on file  Social History Narrative  . Not on file   Social Determinants of Health   Financial Resource Strain:   . Difficulty of Paying Living Expenses:   Food Insecurity:   . Worried About Charity fundraiser in the Last Year:   . Arboriculturist in the Last Year:   Transportation Needs:   . Film/video editor (Medical):   Marland Kitchen Lack of Transportation (Non-Medical):   Physical Activity:   . Days of Exercise per Week:   . Minutes of Exercise per Session:   Stress:   . Feeling of Stress :   Social Connections:   . Frequency of Communication with Friends and Family:   . Frequency of Social Gatherings with Friends and Family:   . Attends Religious Services:   . Active Member of Clubs or  Organizations:   . Attends Archivist Meetings:   Marland Kitchen Marital Status:      Review of Systems: Gen: Denies fever, chills, lightheadedness, dizziness, presyncope, syncope. CV: Denies chest pain or palpitations Resp: Denies dyspnea at rest or cough GI: see HPI Derm: Denies rash Heme: See HPI  Observations/Objective: No distress. Pleasant. Unable to perform complete physical exam due to telephone encounter. No video available.   Assessment and Plan: 68 year old female presenting for follow-up of epigastric abdominal pain, GERD, dysphagia, and elevated alk phos.   Dysphagia: Resolved. EGD January 2021 with normal-appearing esophagus s/p empiric dilation.  Notably, she is also having frequent reflux symptoms prior to EGD that have also resolved with omeprazole 20 mg daily.  Dysphagia may have been due to an occult cervical web but may also have been related to uncontrolled GERD.  She will continue her current medications and monitor for return of symptoms.  GERD: Well-controlled omeprazole 20 mg daily.  No breakthrough symptoms.  No alarm symptoms.  She will continue on her current medications, avoid known dietary triggers and and follow-up in 6 months.   Epigastric abdominal pain: Patient was experiencing epigastric burning 2-3 times a week with associated uncontrolled GERD at her last visit in October 2020.  She underwent EGD in January 2021 revealing a normal esophagus, single gastric polyp that was benign, and mild gastritis.  GERD symptoms are now well controlled on omeprazole 20 mg daily and patient has had resolution of abdominal pain.  No NSAIDs other than 325 mg aspirin daily.  She will continue omeprazole 20 mg daily and follow-up in 6 months.  Diarrhea: Patient had called in January 2021 reporting new onset of loose/mushy stools after eating following increased stress with similar symptoms prior.  Suspected IBS and Bentyl 10 mg up to 3 times daily and at bedtime was prescribed.   Currently using Bentyl maybe once every other week as diarrhea is rare at this point.  Upon further discussion, I suspect diarrhea may be more associated with dietary intolerances as she notes diarrhea triggered by dairy products, spicy foods, and salads.  We discussed limiting/discontinuing Bentyl and trying to avoid dietary triggers.  She is advised to follow a lactose-free diet or take Lactaid pills prior to any dairy products.  Lactose-free handout provided.  Elevated alk phos: Mild elevation of alk phos at 128 was noted on labs in  June 2020 with other LFTs within normal limits.  Ultrasound with probable fatty liver and dilated CBD at 11 mm.  Suspect this is likely related to post-cholecystectomy state.  Had ordered repeat HFP and GGT at last visit but this was not completed.  We will have patient complete prior labs.  Colon cancer screening: Patient is due for repeat colonoscopy.  Last colonoscopy in normal exam in 2007 with recommendations to repeat in 2017.  Patient desires to hold off on colonoscopy for now.  Will discuss at next office visit in 6 months.  Follow Up Instructions: Follow-up in 6 months.  Call with questions or concerns prior.   I discussed the assessment and treatment plan with the patient. The patient was provided an opportunity to ask questions and all were answered. The patient agreed with the plan and demonstrated an understanding of the instructions.   The patient was advised to call back or seek an in-person evaluation if the symptoms worsen or if the condition fails to improve as anticipated.  I provided 17 minutes of non-face-to-face time during this encounter.  Aliene Altes, PA-C [facility]

## 2020-02-22 ENCOUNTER — Encounter: Payer: Self-pay | Admitting: Gastroenterology

## 2020-02-22 ENCOUNTER — Telehealth (INDEPENDENT_AMBULATORY_CARE_PROVIDER_SITE_OTHER): Payer: Medicare HMO | Admitting: Gastroenterology

## 2020-02-22 ENCOUNTER — Other Ambulatory Visit: Payer: Self-pay

## 2020-02-22 DIAGNOSIS — R1310 Dysphagia, unspecified: Secondary | ICD-10-CM | POA: Diagnosis not present

## 2020-02-22 DIAGNOSIS — K219 Gastro-esophageal reflux disease without esophagitis: Secondary | ICD-10-CM | POA: Diagnosis not present

## 2020-02-22 DIAGNOSIS — R748 Abnormal levels of other serum enzymes: Secondary | ICD-10-CM | POA: Diagnosis not present

## 2020-02-22 DIAGNOSIS — R1013 Epigastric pain: Secondary | ICD-10-CM | POA: Diagnosis not present

## 2020-02-22 DIAGNOSIS — R197 Diarrhea, unspecified: Secondary | ICD-10-CM

## 2020-02-22 NOTE — Patient Instructions (Addendum)
Please have your labs completed.  This is to recheck one of your liver numbers that was slightly elevated in June.  Be sure you are fasting and have not eaten a fatty meal for dinner the night prior.  Continue omeprazole 20 mg daily 30 minutes before breakfast.  Continue to avoid all NSAIDs including ibuprofen, Aleve, Advil, and Goody powders.  Continue to avoid your known reflux triggers.  Additional common triggers include fried, fatty, greasy, spicy, citrus foods, carbonated beverages, and caffeine.  Avoid dietary triggers of diarrhea including dairy products, spicy foods, and salads.  If you are going to eat any dairy products, I recommend you take Lactaid tablets prior to consuming dairy to prevent diarrhea.  It is okay to use Bentyl as needed but the goal will be to eventually get away from this completely and control your diarrhea with dietary changes as I suspect diet is likely to be main cause.  We will plan to see back in 6 months.  Call with questions or concerns prior.  Aliene Altes, PA-C [facility]   Lactose-Free Diet, Adult If you have lactose intolerance, you are not able to digest lactose. Lactose is a natural sugar found mainly in dairy milk and dairy products. You may need to avoid all foods and beverages that contain lactose. A lactose-free diet can help you do this. Which foods have lactose? Lactose is found in dairy milk and dairy products, such as:  Yogurt.  Cheese.  Butter.  Margarine.  Sour cream.  Cream.  Whipped toppings and nondairy creamers.  Ice cream and other dairy-based desserts. Lactose is also found in foods or products made with dairy milk or milk ingredients. To find out whether a food contains dairy milk or a milk ingredient, look at the ingredients list. Avoid foods with the statement "May contain milk" and foods that contain:  Milk  powder.  Whey.  Curd.  Caseinate.  Lactose.  Lactalbumin.  Lactoglobulin. What are alternatives to dairy milk and foods made with milk products?  Lactose-free milk.  Soy milk with added calcium and vitamin D.  Almond milk, coconut milk, rice milk, or other nondairy milk alternatives with added calcium and vitamin D. Note that these are low in protein.  Soy products, such as soy yogurt, soy cheese, soy ice cream, and soy-based sour cream.  Other nut milk products, such as almond yogurt, almond cheese, cashew yogurt, cashew cheese, cashew ice cream, coconut yogurt, and coconut ice cream. What are tips for following this plan?  Do not consume foods, beverages, vitamins, minerals, or medicines containing lactose. Read ingredient lists carefully.  Look for the words "lactose-free" on labels.  Use lactase enzyme drops or tablets as directed by your health care provider.  Use lactose-free milk or a milk alternative, such as soy milk or almond milk, for drinking and cooking.  Make sure you get enough calcium and vitamin D in your diet. A lactose-free eating plan can be lacking in these important nutrients.  Take calcium and vitamin D supplements as directed by your health care provider. Talk to your health care provider about supplements if you are not able to get enough calcium and vitamin D from food. What foods can I eat?  Fruits All fresh, canned, frozen, or dried fruits that are not processed with lactose. Vegetables All fresh, frozen, and canned vegetables without cheese, cream, or butter sauces. Grains Any that are not made with dairy milk or dairy products. Meats and other proteins Any meat, fish, poultry,  and other protein sources that are not made with dairy milk or dairy products. Soy cheese and yogurt. Fats and oils Any that are not made with dairy milk or dairy products. Beverages Lactose-free milk. Soy, rice, or almond milk with added calcium and vitamin D.  Fruit and vegetable juices. Sweets and desserts Any that are not made with dairy milk or dairy products. Seasonings and condiments Any that are not made with dairy milk or dairy products. Calcium Calcium is found in many foods that contain lactose and is important for bone health. The amount of calcium you need depends on your age:  Adults younger than 50 years: 1,000 mg of calcium a day.  Adults older than 50 years: 1,200 mg of calcium a day. If you are not getting enough calcium, you may get it from other sources, including:  Orange juice with calcium added. There are 300-350 mg of calcium in 1 cup of orange juice.  Calcium-fortified soy milk. There are 300-400 mg of calcium in 1 cup of calcium-fortified soy milk.  Calcium-fortified rice or almond milk. There are 300 mg of calcium in 1 cup of calcium-fortified rice or almond milk.  Calcium-fortified breakfast cereals. There are 100-1,000 mg of calcium in calcium-fortified breakfast cereals.  Spinach, cooked. There are 145 mg of calcium in  cup of cooked spinach.  Edamame, cooked. There are 130 mg of calcium in  cup of cooked edamame.  Collard greens, cooked. There are 125 mg of calcium in  cup of cooked collard greens.  Kale, frozen or cooked. There are 90 mg of calcium in  cup of cooked or frozen kale.  Almonds. There are 95 mg of calcium in  cup of almonds.  Broccoli, cooked. There are 60 mg of calcium in 1 cup of cooked broccoli. The items listed above may not be a complete list of recommended foods and beverages. Contact a dietitian for more options. What foods are not recommended? Fruits None, unless they are made with dairy milk or dairy products. Vegetables None, unless they are made with dairy milk or dairy products. Grains Any grains that are made with dairy milk or dairy products. Meats and other proteins None, unless they are made with dairy milk or dairy products. Dairy All dairy products, including milk,  goat's milk, buttermilk, kefir, acidophilus milk, flavored milk, evaporated milk, condensed milk, dulce de [place], eggnog, yogurt, cheese, and cheese spreads. Fats and oils Any that are made with milk or milk products. Margarines and salad dressings that contain milk or cheese. Cream. Half and half. Cream cheese. Sour cream. Chip dips made with sour cream or yogurt. Beverages Hot chocolate. Cocoa with lactose. Instant iced teas. Powdered fruit drinks. Smoothies made with dairy milk or yogurt. Sweets and desserts Any that are made with milk or milk products. Seasonings and condiments Chewing gum that has lactose. Spice blends if they contain lactose. Artificial sweeteners that contain lactose. Nondairy creamers. The items listed above may not be a complete list of foods and beverages to avoid. Contact a dietitian for more information. Summary  If you are lactose intolerant, it means that you have a hard time digesting lactose, a natural sugar found in milk and milk products.  Following a lactose-free diet can help you manage this condition.  Calcium is important for bone health and is found in many foods that contain lactose. Talk with your health care provider about other sources of calcium. This information is not intended to replace advice given to you by  your health care provider. Make sure you discuss any questions you have with your health care provider. Document Revised: 10/20/2017 Document Reviewed: 10/20/2017 Elsevier Patient Education  2020 [employer].

## 2020-02-23 ENCOUNTER — Encounter: Payer: Self-pay | Admitting: Gastroenterology

## 2020-02-29 ENCOUNTER — Telehealth: Payer: Self-pay | Admitting: *Deleted

## 2020-02-29 NOTE — Telephone Encounter (Signed)
Pt consented to a virtual/telephone visit on 02/22/20.

## 2020-02-29 NOTE — Telephone Encounter (Signed)
[name], you are scheduled for a virtual visit with your provider today.  Just as we do with appointments in the office, we must obtain your consent to participate.  Your consent will be active for this visit and any virtual visit you may have with one of our providers in the next 365 days.  If you have a MyChart account, I can also send a copy of this consent to you electronically.  All virtual visits are billed to your insurance company just like a traditional visit in the office.  As this is a virtual visit, video technology does not allow for your provider to perform a traditional examination.  This may limit your provider's ability to fully assess your condition.  If your provider identifies any concerns that need to be evaluated in person or the need to arrange testing such as labs, EKG, etc, we will make arrangements to do so.  Although advances in technology are sophisticated, we cannot ensure that it will always work on either your end or our end.  If the connection with a video visit is poor, we may have to switch to a telephone visit.  With either a video or telephone visit, we are not always able to ensure that we have a secure connection.   I need to obtain your verbal consent now.   Are you willing to proceed with your visit today?

## 2020-04-02 ENCOUNTER — Other Ambulatory Visit: Payer: Self-pay

## 2020-04-02 ENCOUNTER — Telehealth: Payer: Self-pay

## 2020-04-02 DIAGNOSIS — R748 Abnormal levels of other serum enzymes: Secondary | ICD-10-CM

## 2020-04-02 NOTE — Telephone Encounter (Signed)
Pt went to [facility] and said it was closed. New lab orders were placed for [facility]. Pt would prefer to go to [facility].

## 2020-04-27 LAB — HEPATIC FUNCTION PANEL
ALT: 13 IU/L (ref 0–32)
AST: 14 IU/L (ref 0–40)
Albumin: 3.9 g/dL (ref 3.8–4.8)
Alkaline Phosphatase: 145 IU/L — ABNORMAL HIGH (ref 48–121)
Bilirubin Total: 0.5 mg/dL (ref 0.0–1.2)
Bilirubin, Direct: 0.14 mg/dL (ref 0.00–0.40)
Total Protein: 6.5 g/dL (ref 6.0–8.5)

## 2020-04-27 LAB — GAMMA GT: GGT: 67 IU/L — ABNORMAL HIGH (ref 0–60)

## 2020-04-27 NOTE — Progress Notes (Signed)
Alk phos slightly higher than it was in June 2020, up to 145 from 128. GGT is also slightly elevated at 67 suggesting that elevated alk phos is coming from the liver. I recommend we check a few more labs and complete an MRI to evaluate her bile ducts.  Magda Paganini, please arrange:  ANA, AMA, and SMA   [facility] Clinical Pool:  Please arrange MRI/MRCP. Dx: elevated alk phos and GGT, dilated CBD on [place]

## 2020-04-30 ENCOUNTER — Other Ambulatory Visit: Payer: Self-pay | Admitting: Emergency Medicine

## 2020-04-30 DIAGNOSIS — R748 Abnormal levels of other serum enzymes: Secondary | ICD-10-CM

## 2020-05-01 ENCOUNTER — Other Ambulatory Visit: Payer: Self-pay | Admitting: *Deleted

## 2020-05-01 ENCOUNTER — Encounter: Payer: Self-pay | Admitting: *Deleted

## 2020-05-01 DIAGNOSIS — K838 Other specified diseases of biliary tract: Secondary | ICD-10-CM

## 2020-05-01 DIAGNOSIS — R748 Abnormal levels of other serum enzymes: Secondary | ICD-10-CM

## 2020-05-08 LAB — MITOCHONDRIAL ANTIBODIES: Mitochondrial Ab: <20 Units (ref 0.0–20.0)

## 2020-05-08 LAB — ANA: ANA Titer 1: NEGATIVE

## 2020-05-08 LAB — ANTI-SMOOTH MUSCLE ANTIBODY, IGG: Smooth Muscle Ab: 8 Units (ref 0–19)

## 2020-05-21 ENCOUNTER — Other Ambulatory Visit: Payer: Self-pay | Admitting: Gastroenterology

## 2020-05-21 ENCOUNTER — Ambulatory Visit (HOSPITAL_COMMUNITY)
Admission: RE | Admit: 2020-05-21 | Discharge: 2020-05-21 | Disposition: A | Discharge status: Home / Self Care | Payer: Medicare HMO | Source: Ambulatory Visit | Attending: Gastroenterology | Admitting: Gastroenterology

## 2020-05-21 ENCOUNTER — Other Ambulatory Visit: Payer: Self-pay

## 2020-05-21 DIAGNOSIS — K838 Other specified diseases of biliary tract: Secondary | ICD-10-CM | POA: Diagnosis present

## 2020-05-21 DIAGNOSIS — R748 Abnormal levels of other serum enzymes: Secondary | ICD-10-CM

## 2020-05-21 MED ORDER — GADOBUTROL 1 MMOL/ML IV SOLN
10.0000 mL | Freq: Once | INTRAVENOUS | Status: AC | PRN
  Administered 2020-05-21: 10 mL via INTRAVENOUS

## 2020-05-22 ENCOUNTER — Other Ambulatory Visit: Payer: Self-pay | Admitting: *Deleted

## 2020-05-22 DIAGNOSIS — K838 Other specified diseases of biliary tract: Secondary | ICD-10-CM

## 2020-05-31 ENCOUNTER — Telehealth: Payer: Self-pay | Admitting: Gastroenterology

## 2020-05-31 NOTE — Telephone Encounter (Signed)
PATIENT CALLED ASKING WHEN SHE WAS GOING TO BE REFERRED TO A DOCTOR FOR HER BILE DUCT STONE   SHE HAD A MRI

## 2020-05-31 NOTE — Telephone Encounter (Signed)
Referral was sent to [facility]. Per referral in Epic, message has been sent to nurse to advise on scheduling.   Tried to call pt, no answer, left message on voicemail and gave phone# to [place] if needed.

## 2020-06-05 ENCOUNTER — Other Ambulatory Visit: Payer: Self-pay

## 2020-06-05 ENCOUNTER — Telehealth: Payer: Self-pay

## 2020-06-05 DIAGNOSIS — K838 Other specified diseases of biliary tract: Secondary | ICD-10-CM

## 2020-06-05 NOTE — Telephone Encounter (Signed)
Marissa Banister, MD  Timothy Lasso, RN; Mansouraty, Telford Nab., MD We just received word from Laneta Simmers that there is extra time available for anesthesia support at both hospitals throughout the month of September.   I have availability this Thursday (aug 2) or next (Aug 9th) to help her with ERCP for common bile duct stone.   Thanks       Previous Messages   ----- Message -----  From: Timothy Lasso, RN  Sent: 06/04/2020  8:13 AM EDT  To: Marissa Banister, MD, *    ----- Message -----  From: Valma Cava  Sent: 05/29/2020  8:36 AM EDT  To: Timothy Lasso, RN   [name], this patient is being referred for an ERCP. Please review and advise! Thank you!

## 2020-06-05 NOTE — Telephone Encounter (Signed)
The pt has been scheduled for ERCP with Dr Ardis Hughs on 06/14/20 at 115 pm.  COVID test on 06/12/20. Pt instructed and medications reviewed.  Patient instructions mailed to home.  Patient to call with any questions or concerns.

## 2020-06-12 ENCOUNTER — Other Ambulatory Visit (HOSPITAL_COMMUNITY)
Admission: RE | Admit: 2020-06-12 | Discharge: 2020-06-12 | Disposition: A | Discharge status: Home / Self Care | Payer: Medicare HMO | Source: Ambulatory Visit | Attending: Gastroenterology | Admitting: Gastroenterology

## 2020-06-12 DIAGNOSIS — Z20822 Contact with and (suspected) exposure to covid-19: Secondary | ICD-10-CM | POA: Diagnosis not present

## 2020-06-12 DIAGNOSIS — Z01812 Encounter for preprocedural laboratory examination: Secondary | ICD-10-CM | POA: Diagnosis present

## 2020-06-12 LAB — SARS CORONAVIRUS 2 (TAT 6-24 HRS): SARS Coronavirus 2: NEGATIVE

## 2020-06-14 ENCOUNTER — Encounter (HOSPITAL_COMMUNITY): Payer: Self-pay | Admitting: Gastroenterology

## 2020-06-14 ENCOUNTER — Ambulatory Visit (HOSPITAL_COMMUNITY): Payer: Medicare HMO

## 2020-06-14 ENCOUNTER — Ambulatory Visit (HOSPITAL_COMMUNITY)
Admission: RE | Admit: 2020-06-14 | Discharge: 2020-06-14 | Disposition: A | Discharge status: Home / Self Care | Payer: Medicare HMO | Attending: Gastroenterology | Admitting: Gastroenterology

## 2020-06-14 ENCOUNTER — Ambulatory Visit (HOSPITAL_COMMUNITY): Payer: Medicare HMO | Admitting: Certified Registered Nurse Anesthetist

## 2020-06-14 ENCOUNTER — Other Ambulatory Visit: Payer: Self-pay

## 2020-06-14 ENCOUNTER — Encounter (HOSPITAL_COMMUNITY)
Admission: RE | Disposition: A | Discharge status: Home / Self Care | Payer: Self-pay | Source: Home / Self Care | Attending: Gastroenterology

## 2020-06-14 DIAGNOSIS — Z6841 Body Mass Index (BMI) 40.0 and over, adult: Secondary | ICD-10-CM | POA: Insufficient documentation

## 2020-06-14 DIAGNOSIS — K9186 Retained cholelithiasis following cholecystectomy: Secondary | ICD-10-CM | POA: Insufficient documentation

## 2020-06-14 DIAGNOSIS — Z8673 Personal history of transient ischemic attack (TIA), and cerebral infarction without residual deficits: Secondary | ICD-10-CM | POA: Insufficient documentation

## 2020-06-14 DIAGNOSIS — K8050 Calculus of bile duct without cholangitis or cholecystitis without obstruction: Secondary | ICD-10-CM

## 2020-06-14 DIAGNOSIS — Z96653 Presence of artificial knee joint, bilateral: Secondary | ICD-10-CM | POA: Insufficient documentation

## 2020-06-14 DIAGNOSIS — K838 Other specified diseases of biliary tract: Secondary | ICD-10-CM

## 2020-06-14 DIAGNOSIS — I10 Essential (primary) hypertension: Secondary | ICD-10-CM | POA: Insufficient documentation

## 2020-06-14 HISTORY — PX: SPHINCTEROTOMY: SHX5544

## 2020-06-14 HISTORY — PX: ENDOSCOPIC RETROGRADE CHOLANGIOPANCREATOGRAPHY (ERCP) WITH PROPOFOL: SHX5810

## 2020-06-14 SURGERY — ENDOSCOPIC RETROGRADE CHOLANGIOPANCREATOGRAPHY (ERCP) WITH PROPOFOL
Anesthesia: General

## 2020-06-14 MED ORDER — SODIUM CHLORIDE 0.9 % IV SOLN: INTRAVENOUS | Status: DC

## 2020-06-14 MED ORDER — PROPOFOL 10 MG/ML IV BOLUS
INTRAVENOUS | Status: DC | PRN
  Administered 2020-06-14: 200 mg via INTRAVENOUS

## 2020-06-14 MED ORDER — SUGAMMADEX SODIUM 200 MG/2ML IV SOLN
INTRAVENOUS | Status: DC | PRN
  Administered 2020-06-14: 300 mg via INTRAVENOUS

## 2020-06-14 MED ORDER — ROCURONIUM BROMIDE 10 MG/ML (PF) SYRINGE
PREFILLED_SYRINGE | INTRAVENOUS | Status: DC | PRN
  Administered 2020-06-14: 50 mg via INTRAVENOUS

## 2020-06-14 MED ORDER — INDOMETHACIN 50 MG RE SUPP
RECTAL | Status: AC
  Filled 2020-06-14: qty 2

## 2020-06-14 MED ORDER — PROPOFOL 10 MG/ML IV BOLUS
INTRAVENOUS | Status: AC
  Filled 2020-06-14: qty 20

## 2020-06-14 MED ORDER — LIDOCAINE 2% (20 MG/ML) 5 ML SYRINGE
INTRAMUSCULAR | Status: DC | PRN
  Administered 2020-06-14: 60 mg via INTRAVENOUS

## 2020-06-14 MED ORDER — PROPOFOL 500 MG/50ML IV EMUL
INTRAVENOUS | Status: AC
  Filled 2020-06-14: qty 50

## 2020-06-14 MED ORDER — GLUCAGON HCL RDNA (DIAGNOSTIC) 1 MG IJ SOLR
INTRAMUSCULAR | Status: AC
  Filled 2020-06-14: qty 1

## 2020-06-14 MED ORDER — CIPROFLOXACIN IN D5W 400 MG/200ML IV SOLN
INTRAVENOUS | Status: DC | PRN
  Administered 2020-06-14: 400 mg via INTRAVENOUS

## 2020-06-14 MED ORDER — CIPROFLOXACIN IN D5W 400 MG/200ML IV SOLN
INTRAVENOUS | Status: AC
  Filled 2020-06-14: qty 200

## 2020-06-14 MED ORDER — PROPOFOL 500 MG/50ML IV EMUL
INTRAVENOUS | Status: AC
  Filled 2020-06-14: qty 100

## 2020-06-14 MED ORDER — FENTANYL CITRATE (PF) 100 MCG/2ML IJ SOLN
INTRAMUSCULAR | Status: DC | PRN
Start: 2020-06-14 — End: 2020-06-14
  Administered 2020-06-14: 100 ug via INTRAVENOUS

## 2020-06-14 MED ORDER — SODIUM CHLORIDE 0.9 % IV SOLN
INTRAVENOUS | Status: DC | PRN
  Administered 2020-06-14: 30 mL

## 2020-06-14 MED ORDER — LACTATED RINGERS IV SOLN: INTRAVENOUS | Status: DC

## 2020-06-14 MED ORDER — INDOMETHACIN 50 MG RE SUPP
RECTAL | Status: DC | PRN
  Administered 2020-06-14: 100 mg via RECTAL

## 2020-06-14 MED ORDER — FENTANYL CITRATE (PF) 100 MCG/2ML IJ SOLN
INTRAMUSCULAR | Status: AC
  Filled 2020-06-14: qty 2

## 2020-06-14 MED ORDER — SUCCINYLCHOLINE CHLORIDE 20 MG/ML IJ SOLN
INTRAMUSCULAR | Status: DC | PRN
  Administered 2020-06-14: 120 mg via INTRAVENOUS

## 2020-06-14 NOTE — Anesthesia Procedure Notes (Signed)
Procedure Name: Intubation Performed by: Essica Kiker J, CRNA Pre-anesthesia Checklist: Patient identified, Emergency Drugs available, Suction available, Patient being monitored and Timeout performed Patient Re-evaluated:Patient Re-evaluated prior to induction Oxygen Delivery Method: Circle system utilized Preoxygenation: Pre-oxygenation with 100% oxygen Induction Type: IV induction Ventilation: Mask ventilation without difficulty Laryngoscope Size: Mac and 4 Grade View: Grade I Tube type: Oral Tube size: 7.0 mm Number of attempts: 1 Airway Equipment and Method: Stylet Placement Confirmation: ETT inserted through vocal cords under direct vision,  positive ETCO2 and breath sounds checked- equal and bilateral Secured at: 21 cm Tube secured with: Tape Dental Injury: Teeth and Oropharynx as per pre-operative assessment        

## 2020-06-14 NOTE — Op Note (Signed)
[facility] Patient Name: [name] Procedure Date: 06/14/2020 MRN: [id number] Attending MD: Milus Banister , MD Date of Birth: [birth year] CSN: [id number] Age: 69 Admit Type: Outpatient Procedure:                ERCP Indications:              Lap chole 2017 for cholelithiasis, cholecystitis,                            no IOC; uneventful. Recent slightly elevated liver                            tests led to imaging. MRI showed CHD 93mm, CBD 78mm                            containing a 29mm stone Providers:                Milus Banister, MD, Cleda Daub, RN, William Dalton, Technician Referring MD:             Aliene Altes, PA Medicines:                General Anesthesia, Cipro 400 mg IV, Indomethacin                            562 mg PR Complications:            No immediate complications. Estimated blood loss:                            None Estimated Blood Loss:     Estimated blood loss: none. Procedure:                Pre-Anesthesia Assessment:                           - Prior to the procedure, a History and Physical                            was performed, and patient medications and                            allergies were reviewed. The patient's tolerance of                            previous anesthesia was also reviewed. The risks                            and benefits of the procedure and the sedation                            options and risks were discussed with the patient.  All questions were answered, and informed consent                            was obtained. Prior Anticoagulants: The patient has                            taken no previous anticoagulant or antiplatelet                            agents. ASA Grade Assessment: II - A patient with                            mild systemic disease. After reviewing the risks                            and benefits, the patient was deemed in                             satisfactory condition to undergo the procedure.                           After obtaining informed consent, the scope was                            passed under direct vision. Throughout the                            procedure, the patient's blood pressure, pulse, and                            oxygen saturations were monitored continuously. The                            TJF-Q180V ([id number] Olympus Duodenoscope was                            introduced through the mouth, and used to inject                            contrast into and used to inject contrast into the                            bile duct. The ERCP was accomplished without                            difficulty. The patient tolerated the procedure                            well. Scope In: Scope Out: Findings:      A scout film of the abdomen was obtained. Surgical clips, consistent       with a previous cholecystectomy, were seen in the area of the right       upper quadrant of the abdomen. The esophagus was successfully intubated  under direct vision. The scope was advanced to a normal major papilla in       the descending duodenum without detailed examination of the pharynx,       larynx and associated structures, and upper GI tract. There was a small       duodenal periampullary diverticulum that did not interfere with biliary       cannulation. A .039 Jagtome over a .025 wire was used to cannulate the       bile duct after a .035 wire was temporarily placed in the main       pancreatic duct to facilitate biliary cannulation. Contrast was injected       and cholangiogram revealed a diffused dilated extrahepatic biliary tree       that tapered smoothy into the duodenum. No biliary leak. There was not a       clear stone however MRI images were convincing enough for a 1mm CBD that       I proceeded with an adequate biliary sphincteromy and then balloon       sweeping, several times with  9-83mm and then 12-67mm biliary retrieval       balloon. No stones were delivered into the duodenum but there was some       biodebris/small sludge particles noted. Completion, occlusion       cholangiogram revealed no filling defects. The main pancreatic duct was       cannulated with a wire and gently injected with contrast a single time. Impression:               - Biliary dilation noted. S/p biliary                            sphincterotomy and balloon sweeping today. The                            stone noted by MRI last month probably passed prior                            to this examination.                           - Duodenal periampullary diverticululm. Moderate Sedation:      Not Applicable - Patient had care per Anesthesia. Recommendation:           - Discharge patient to home.                           - Follow clinically. Procedure Code(s):        --- Professional ---                           [phone], Endoscopic retrograde                            cholangiopancreatography (ERCP); with removal of                            calculi/debris from biliary/pancreatic duct(s)  43262, Endoscopic retrograde                            cholangiopancreatography (ERCP); with                            sphincterotomy/papillotomy Diagnosis Code(s):        --- Professional ---                           K80.50, Calculus of bile duct without cholangitis                            or cholecystitis without obstruction CPT copyright 2019 American Medical Association. All rights reserved. The codes documented in this report are preliminary and upon coder review may  be revised to meet current compliance requirements. Milus Banister, MD 06/14/2020 2:04:52 PM This report has been signed electronically. Number of Addenda: 0

## 2020-06-14 NOTE — Anesthesia Preprocedure Evaluation (Signed)
Anesthesia Evaluation  Patient identified by MRN, date of birth, ID band Patient awake    Reviewed: Allergy & Precautions, NPO status , Patient's Chart, lab work & pertinent test results  History of Anesthesia Complications Negative for: history of anesthetic complications  Airway Mallampati: II  TM Distance: >3 FB Neck ROM: Full    Dental  (+) Missing, Caps, Dental Advisory Given   Pulmonary neg pulmonary ROS,    Pulmonary exam normal        Cardiovascular hypertension, Pt. on medications + DOE  Normal cardiovascular exam Rhythm:Regular Rate:Normal     Neuro/Psych TIA (2004)   GI/Hepatic Neg liver ROS, GERD  Controlled,  Endo/Other  Morbid obesity  Renal/GU negative Renal ROS     Musculoskeletal negative musculoskeletal ROS (+)   Abdominal   Peds  Hematology negative hematology ROS (+)   Anesthesia Other Findings Day of surgery medications reviewed with the patient.  Reproductive/Obstetrics                             Anesthesia Physical  Anesthesia Plan  ASA: III  Anesthesia Plan: General   Post-op Pain Management: GA combined w/ Regional for post-op pain   Induction: Intravenous  PONV Risk Score and Plan: 4 or greater and Ondansetron, Dexamethasone, Treatment may vary due to age or medical condition and Midazolam  Airway Management Planned: Oral ETT  Additional Equipment:   Intra-op Plan:   Post-operative Plan: Extubation in OR  Informed Consent: I have reviewed the patients History and Physical, chart, labs and discussed the procedure including the risks, benefits and alternatives for the proposed anesthesia with the patient or authorized representative who has indicated his/her understanding and acceptance.     Dental advisory given  Plan Discussed with: CRNA  Anesthesia Plan Comments:         Anesthesia Quick Evaluation

## 2020-06-14 NOTE — H&P (Signed)
HPI: This is a woman sent for ERCP by Aliene Altes at [facility] for retained CBD stone following lap chole 4 years ago.  MRI with MRCP confirmed stone last month after she was noted to have elevated liver tests.   ROS: complete GI ROS as described in HPI, all other review negative.  Constitutional:  No unintentional weight loss   Past Medical History:  Diagnosis Date  . GERD (gastroesophageal reflux disease)   . HTN (hypertension)   . Hyperlipidemia   . TIA (transient ischemic attack) 2004   no residual    Past Surgical History:  Procedure Laterality Date  . BIOPSY  10/28/2019   Procedure: BIOPSY;  Surgeon: Danie Binder, MD;  Location: [facility];  Service: Endoscopy;;  gastric   . BREAST REDUCTION SURGERY    . CHOLECYSTECTOMY N/A 02/29/2016   Procedure: LAPAROSCOPIC CHOLECYSTECTOMY;  Surgeon: Aviva Signs, MD;  Location: [facility];  Service: General;  Laterality: N/A;  . COLONOSCOPY  04/13/2006   PPI:RJJOAC colon without evidence of polyps, masses, inflammatory changes diverticular or vascular ectasia/Normal retroflex view of the rectum  . ESOPHAGOGASTRODUODENOSCOPY  2011   peptic stricture s/p dialtion, miuld gastritis likely NSAID related. No H. pylori  . ESOPHAGOGASTRODUODENOSCOPY N/A 10/28/2019   Procedure: ESOPHAGOGASTRODUODENOSCOPY (EGD);  Surgeon: Danie Binder, MD; Normal-appearing esophagus s/p empiric dilation, single gastric polyp resected and retrieved, mild gastritis s/p biopsy.  Pathology with fundic gland polyp.  Marland Kitchen JOINT REPLACEMENT    . REPLACEMENT TOTAL KNEE Bilateral   . SAVORY DILATION N/A 10/28/2019   Procedure: SAVORY DILATION;  Surgeon: Danie Binder, MD;  Location: [facility];  Service: Endoscopy;  Laterality: N/A;  . SHOULDER ARTHROSCOPY WITH ROTATOR CUFF REPAIR AND SUBACROMIAL DECOMPRESSION Left 05/05/2019   Procedure: Left Shoulder Arthroscopy and Rotator Cuff Repair ACROMIOPLASTY;  Surgeon: Melrose Nakayama, MD;  Location: [facility];  Service: Orthopedics;  Laterality: Left;    Current Facility-Administered Medications  Medication Dose Route Frequency Provider Last Rate Last Admin  . 0.9 %  sodium chloride infusion   Intravenous Continuous Milus Banister, MD      . lactated ringers infusion   Intravenous Continuous Milus Banister, MD        Allergies as of 06/05/2020  . (No Known Allergies)    Family History  Problem Relation Age of Onset  . Congestive Heart Failure Mother   . Diabetes Mother   . Hypertension Mother   . Diabetes Father   . Osteoarthritis Father   . Colon cancer Neg Hx     Social History   Socioeconomic History  . Marital status: Married    Spouse name: Not on file  . Number of children: Not on file  . Years of education: Not on file  . Highest education level: Not on file  Occupational History  . Occupation: [employer]    Employer: [employer]  Tobacco Use  . Smoking status: Never Smoker  . Smokeless tobacco: Never Used  Vaping Use  . Vaping Use: Never used  Substance and Sexual Activity  . Alcohol use: No  . Drug use: No  . Sexual activity: Not on file  Other Topics Concern  . Not on file  Social History Narrative  . Not on file   Social Determinants of Health   Financial Resource Strain:   . Difficulty of Paying Living Expenses: Not on file  Food Insecurity:   . Worried About Charity fundraiser  in the Last Year: Not on file  . Ran Out of Food in the Last Year: Not on file  Transportation Needs:   . Lack of Transportation (Medical): Not on file  . Lack of Transportation (Non-Medical): Not on file  Physical Activity:   . Days of Exercise per Week: Not on file  . Minutes of Exercise per Session: Not on file  Stress:   . Feeling of Stress : Not on file  Social Connections:   . Frequency of Communication with Friends and Family: Not on file  . Frequency of Social Gatherings with Friends and Family: Not on file  .  Attends Religious Services: Not on file  . Active Member of Clubs or Organizations: Not on file  . Attends Archivist Meetings: Not on file  . Marital Status: Not on file  Intimate Partner Violence:   . Fear of Current or Ex-Partner: Not on file  . Emotionally Abused: Not on file  . Physically Abused: Not on file  . Sexually Abused: Not on file     Physical Exam: There were no vitals taken for this visit. Constitutional: generally well-appearing Psychiatric: alert and oriented x3 Abdomen: soft, nontender, nondistended, no obvious ascites, no peritoneal signs, normal bowel sounds No peripheral edema noted in lower extremities  Assessment and plan: 69 y.o. female with CBD stone, lap chole 2017  For ERCP today  Please see the "Patient Instructions" section for addition details about the plan.  Owens Loffler, MD [facility] 06/14/2020, 11:36 AM

## 2020-06-14 NOTE — Discharge Instructions (Signed)

## 2020-06-14 NOTE — Transfer of Care (Signed)
Immediate Anesthesia Transfer of Care Note  Patient: [name]  Procedure(s) Performed: ENDOSCOPIC RETROGRADE CHOLANGIOPANCREATOGRAPHY (ERCP) WITH PROPOFOL (N/A )  Patient Location: PACU  Anesthesia Type:General  Level of Consciousness: sedated, patient cooperative and responds to stimulation  Airway & Oxygen Therapy: Patient Spontanous Breathing and Patient connected to face mask oxygen  Post-op Assessment: Report given to RN and Post -op Vital signs reviewed and stable  Post vital signs: Reviewed and stable  Last Vitals:  Vitals Value Taken Time  BP 167/82 06/14/20 1403  Temp    Pulse 76 06/14/20 1405  Resp 19 06/14/20 1405  SpO2 96 % 06/14/20 1405  Vitals shown include unvalidated device data.  Last Pain:  Vitals:   06/14/20 1151  TempSrc: Oral  PainSc: 0-No pain         Complications: No complications documented.

## 2020-06-14 NOTE — Anesthesia Postprocedure Evaluation (Signed)
Anesthesia Post Note  Patient: [name]  Procedure(s) Performed: ENDOSCOPIC RETROGRADE CHOLANGIOPANCREATOGRAPHY (ERCP) WITH PROPOFOL (N/A ) SPHINCTEROTOMY     Patient location during evaluation: PACU Anesthesia Type: General Level of consciousness: awake and alert Pain management: pain level controlled Vital Signs Assessment: post-procedure vital signs reviewed and stable Respiratory status: spontaneous breathing, nonlabored ventilation, respiratory function stable and patient connected to nasal cannula oxygen Cardiovascular status: blood pressure returned to baseline and stable Postop Assessment: no apparent nausea or vomiting Anesthetic complications: no   No complications documented.  Last Vitals:  Vitals:   06/14/20 1420 06/14/20 1430  BP: (!) 173/128 (!) 179/82  Pulse: 69 (!) 56  Resp: (!) 26 18  Temp:    SpO2: 98% 98%    Last Pain:  Vitals:   06/14/20 1430  TempSrc:   PainSc: 0-No pain                 Barnet Glasgow

## 2020-06-19 ENCOUNTER — Encounter (HOSPITAL_COMMUNITY): Payer: Self-pay | Admitting: Gastroenterology

## 2020-08-23 NOTE — Progress Notes (Signed)
Referring Provider: Asencion Noble, MD Primary Care Physician:  Asencion Noble, MD Primary GI Physician: Dr. Abbey Chatters  Chief Complaint  Patient presents with  . Gastroesophageal Reflux    doing ok  . Hemorrhoids    bleeding, itching    HPI:   [name] is a 69 y.o. female presenting today for follow-up of elevated alk phos and GERD.  History of GERD, dyspepsia and dysphagia s/p EGD January 2021 revealing normal esophagus s/p empiric dilation, single gastric polyp is benign, mild gastritis, intermittent diarrhea likely secondary to dietary intolerances with possible component of IBS exacerbated by stress.  Last colonoscopy in 2007 was normal.  She is overdue for screening.   She was last seen via telephone visit 02/22/2020.  GERD was well controlled on omeprazole 20 mg daily.  Dysphagia had resolved.  Dyspepsia had resolved.  Diarrhea had improved and was only using Bentyl once every other week.  She preferred to hold off on colonoscopy.  She is advised to continue omeprazole 40 mg daily, avoid dietary triggers of diarrhea including dairy products or take Lactaid tablets, spicy foods, and salads, update HFP and obtain GGT to follow-up on elevated alk phos.  04/26/2020: Alk phos 145 (H), GGT 67 (H).  05/02/2020: AMA negative, ANA negative, ASMA negative 05/21/2020: MRI/MRCP: 5 mm stone in CBD with minimal intrahepatic and mild to moderate extrahepatic biliary dilation, hepatic steatosis, small benign cyst of the left kidney. 06/14/2020: ERCP with Dr. Ardis Hughs; biliary dilation noticed s/p biliary sphincterotomy and balloon sweeping.  The stone noted on MRI probably passed prior to examination, duodenal periampullary diverticulum.  Today:  GERD: Well controlled on omeprazole 20 mg daily. No dysphagia.   Elevated alk phos/CBD stone: Doing well after her procedure. No nausea or vomiting. No abdominal pain.   Has history of hemorrhoids. States these developed over the last few years. For the last few  weeks, she has had rectal itching. Hemorrhoids are prolapsed. Using preparation H ([employer] brand) twice daily most days but has had to continue to use it. No sharp rectal pain. Occasional burning. No constipation. Occasional diarrhea. Most recent episode was following a salad. Otherwise, diarrhea hasn't been a problem. Limiting dairy products. Hemorrhoids flared after her most recent episode of diarrhea. No blood in the stool. Had a small amount of bright red blood on toilet tissue when she wiped x1 at the onset of hemorrhoid symptoms.  No melena.  Colon cancer screening: Ok with scheduling colonoscopy in January.    Past Medical History:  Diagnosis Date  . GERD (gastroesophageal reflux disease)   . HTN (hypertension)   . Hyperlipidemia   . TIA (transient ischemic attack) 2004   no residual    Past Surgical History:  Procedure Laterality Date  . BIOPSY  10/28/2019   Procedure: BIOPSY;  Surgeon: Danie Binder, MD;  Location: [facility];  Service: Endoscopy;;  gastric   . BREAST REDUCTION SURGERY    . CHOLECYSTECTOMY N/A 02/29/2016   Procedure: LAPAROSCOPIC CHOLECYSTECTOMY;  Surgeon: Aviva Signs, MD;  Location: [facility];  Service: General;  Laterality: N/A;  . COLONOSCOPY  04/13/2006   LFY:BOFBPZ colon without evidence of polyps, masses, inflammatory changes diverticular or vascular ectasia/Normal retroflex view of the rectum  . ENDOSCOPIC RETROGRADE CHOLANGIOPANCREATOGRAPHY (ERCP) WITH PROPOFOL N/A 06/14/2020   Procedure: ENDOSCOPIC RETROGRADE CHOLANGIOPANCREATOGRAPHY (ERCP) WITH PROPOFOL;  Surgeon: Milus Banister, MD;  Location: [facility];  Service: Endoscopy;  Laterality: N/A;  . ESOPHAGOGASTRODUODENOSCOPY  2011   peptic stricture s/p  dialtion, miuld gastritis likely NSAID related. No H. pylori  . ESOPHAGOGASTRODUODENOSCOPY N/A 10/28/2019   Procedure: ESOPHAGOGASTRODUODENOSCOPY (EGD);  Surgeon: Danie Binder, MD; Normal-appearing esophagus s/p empiric dilation, single gastric  polyp resected and retrieved, mild gastritis s/p biopsy.  Pathology with fundic gland polyp.  Marland Kitchen JOINT REPLACEMENT    . REPLACEMENT TOTAL KNEE Bilateral   . SAVORY DILATION N/A 10/28/2019   Procedure: SAVORY DILATION;  Surgeon: Danie Binder, MD;  Location: [facility];  Service: Endoscopy;  Laterality: N/A;  . SHOULDER ARTHROSCOPY WITH ROTATOR CUFF REPAIR AND SUBACROMIAL DECOMPRESSION Left 05/05/2019   Procedure: Left Shoulder Arthroscopy and Rotator Cuff Repair ACROMIOPLASTY;  Surgeon: Melrose Nakayama, MD;  Location: [facility];  Service: Orthopedics;  Laterality: Left;  . SPHINCTEROTOMY  06/14/2020   Procedure: SPHINCTEROTOMY;  Surgeon: Milus Banister, MD;  Location: [facility];  Service: Endoscopy;;  balloon sweep    Current Outpatient Medications  Medication Sig Dispense Refill  . acetaminophen (TYLENOL) 500 MG tablet Take 500-1,000 mg by mouth every 6 (six) hours as needed (for pain.).     Marland Kitchen aspirin 325 MG tablet Take 325 mg by mouth at bedtime.     . Coenzyme Q10 (COQ10) 200 MG CAPS Take 200 mg by mouth at bedtime.    Marland Kitchen omeprazole (PRILOSEC) 20 MG capsule 1 PO 30 MINS PRIOR TO BREAKFAST. (Patient taking differently: Take 20 mg by mouth daily as needed (acid reflux). ) 90 capsule 3  . oxybutynin (DITROPAN) 5 MG tablet Take 5 mg by mouth at bedtime.     . phenylephrine-shark liver oil-mineral oil-petrolatum (PREPARATION H) 0.25-14-74.9 % rectal ointment Place 1 application rectally 2 (two) times daily as needed for hemorrhoids.    . ramipril (ALTACE) 2.5 MG capsule Take 2.5 mg by mouth daily.      . simvastatin (ZOCOR) 40 MG tablet Take 40 mg by mouth at bedtime.       No current facility-administered medications for this visit.    Allergies as of 08/24/2020  . (No Known Allergies)    Family History  Problem Relation Age of Onset  . Congestive Heart Failure Mother   . Diabetes Mother   . Hypertension Mother   . Diabetes Father   . Osteoarthritis Father   .  Colon cancer Neg Hx     Social History   Socioeconomic History  . Marital status: Married    Spouse name: Not on file  . Number of children: Not on file  . Years of education: Not on file  . Highest education level: Not on file  Occupational History  . Occupation: Pooler    Employer: [employer]  Tobacco Use  . Smoking status: Never Smoker  . Smokeless tobacco: Never Used  Vaping Use  . Vaping Use: Never used  Substance and Sexual Activity  . Alcohol use: No  . Drug use: No  . Sexual activity: Not on file  Other Topics Concern  . Not on file  Social History Narrative  . Not on file   Social Determinants of Health   Financial Resource Strain:   . Difficulty of Paying Living Expenses: Not on file  Food Insecurity:   . Worried About Charity fundraiser in the Last Year: Not on file  . Ran Out of Food in the Last Year: Not on file  Transportation Needs:   . Lack of Transportation (Medical): Not on file  . Lack of Transportation (Non-Medical): Not on file  Physical Activity:   . Days of Exercise per Week: Not on file  . Minutes of Exercise per Session: Not on file  Stress:   . Feeling of Stress : Not on file  Social Connections:   . Frequency of Communication with Friends and Family: Not on file  . Frequency of Social Gatherings with Friends and Family: Not on file  . Attends Religious Services: Not on file  . Active Member of Clubs or Organizations: Not on file  . Attends Archivist Meetings: Not on file  . Marital Status: Not on file    Review of Systems: Gen: Denies fever, chills, cold or flulike symptoms, lightheadedness, dizziness, presyncope, syncope CV: Denies chest pain or palpitations Resp: Denies dyspnea or cough GI: See HPI Heme: See HPI  Physical Exam: BP (!) 149/79   Pulse 63   Temp (!) 96.8 F (36 C) (Temporal)   Ht _0  (1.753 m)   Wt 285 lb 9.6 oz (129.5 kg)   BMI 42.18 kg/m  General:   Alert and  oriented. No distress noted. Pleasant and cooperative.  Head:  Normocephalic and atraumatic. Eyes:  Conjuctiva clear without scleral icterus. Heart:  S1, S2 present without murmurs appreciated. Lungs:  Clear to auscultation bilaterally. No wheezes, rales, or rhonchi. No distress.  Abdomen:  +BS, soft, non-tender and non-distended. No rebound or guarding. No HSM or masses noted. Rectal: Patient declined Msk:  Symmetrical without gross deformities. Normal posture. Extremities:  Without edema. Neurologic:  Alert and  oriented x4 Psych: Normal mood and affect.

## 2020-08-24 ENCOUNTER — Encounter: Payer: Self-pay | Admitting: Gastroenterology

## 2020-08-24 ENCOUNTER — Encounter: Payer: Self-pay | Admitting: Internal Medicine

## 2020-08-24 ENCOUNTER — Ambulatory Visit: Payer: Medicare HMO | Admitting: Gastroenterology

## 2020-08-24 ENCOUNTER — Other Ambulatory Visit: Payer: Self-pay

## 2020-08-24 VITALS — BP 149/79 | HR 63 | Temp 96.8°F | Ht 69.0 in | Wt 285.6 lb

## 2020-08-24 DIAGNOSIS — R748 Abnormal levels of other serum enzymes: Secondary | ICD-10-CM

## 2020-08-24 DIAGNOSIS — K219 Gastro-esophageal reflux disease without esophagitis: Secondary | ICD-10-CM | POA: Diagnosis not present

## 2020-08-24 DIAGNOSIS — L290 Pruritus ani: Secondary | ICD-10-CM | POA: Diagnosis not present

## 2020-08-24 DIAGNOSIS — Z1211 Encounter for screening for malignant neoplasm of colon: Secondary | ICD-10-CM

## 2020-08-24 NOTE — Assessment & Plan Note (Addendum)
Patient had isolated elevated alk phos dating back to June 2020 with associated intermittent tightening sensation in her epigastric area and nausea similar to symptoms prior to her cholecystectomy.  Labs in July 2021 with Alk phos 145 (H), GGT 67 (H),  AMA negative, ANA negative, ASMA negative. MRI/MRCP August 2021 with 5 mm stone in CBD with minimal intrahepatic and mild to moderate extrahepatic biliary dilation, hepatic steatosis. ERCP in September 2021 with Dr. Ardis Hughs with biliary dilation noticed s/p biliary sphincterotomy and balloon sweeping.  The stone noted on MRI probably passed prior to examination, duodenal periampullary diverticulum.  She is currently feeling well and has not had any return of epigastric pain or nausea.  She has not had any repeat labs since her procedure.  We will repeat HFP 1 more time to ensure her alk phos has returned to normal.

## 2020-08-24 NOTE — Assessment & Plan Note (Addendum)
Patient reports history of hemorrhoids that are prolapsed and currently causing symptoms including rectal itching and occasional burning.  No sharp rectal pain.  States hemorrhoids flared after an episode of diarrhea.  Has been using Preparation H with mild improvement.  Had low-volume toilet tissue hematochezia x1 when symptoms first began.  Notably, she is overdue for colonoscopy with last colonoscopy in 2007 with normal exam.  Patient declined rectal exam today.  Suspect her single episode of toilet tissue hematochezia was likely secondary to hemorrhoids.  Plan: Start [facility] hemorrhoid cream compounded lidocaine twice daily for the next 10-14 days. Proceed with colonoscopy with propofol with Dr. Abbey Chatters in the near future.  Patient prefers to be scheduled in January 2022.  The risks, benefits, and alternatives have been discussed with the patient in detail. The patient states understanding and desires to proceed.  ASA III Follow-up in 6 months.  Advise she call with any worsening symptoms.

## 2020-08-24 NOTE — Patient Instructions (Addendum)
Please have labs completed to recheck your liver enzymes. I have placed orders for [facility].  We will get you scheduled for a colonoscopy in the near future with Dr. Abbey Chatters.  I am calling in a compounded hemorrhoid cream to [facility].  They will call you when the cream is ready to pick up.  We will apply this twice daily to your rectum for 10-14 days.  Continue taking omeprazole 20 mg daily.  We will plan to see you back in the office in 6 months or sooner if needed.  Do not hesitate to call if you have questions or concerns prior.  It was good to see you today!  I hope he had a great Thanksgiving and Christmas!  Aliene Altes, PA-C [facility]

## 2020-08-24 NOTE — Assessment & Plan Note (Signed)
Well-controlled on omeprazole 20 mg daily.  Advise she continue her current medications and follow-up in 6 months.

## 2020-08-24 NOTE — Assessment & Plan Note (Signed)
69 year old female overdue for screening colonoscopy.  Last colonoscopy was in 2007 with normal exam.  Currently dealing with hemorrhoid symptoms including rectal itching, occasional rectal burning, and a single episode of low volume toilet tissue hematochezia which began following an episode of diarrhea.  Otherwise, no significant upper or lower GI symptoms.  No family history of colon cancer.  Patient declined rectal exam today.  Plan: Proceed with colonoscopy with propofol with Dr. Abbey Chatters in the near future.  Patient prefers to be scheduled in January 2022. The risks, benefits, and alternatives have been discussed with the patient in detail. The patient states understanding and desires to proceed.  ASA III Hemorrhoid symptoms addressed above. Follow-up after procedure.

## 2020-08-27 ENCOUNTER — Telehealth: Payer: Self-pay | Admitting: *Deleted

## 2020-08-27 ENCOUNTER — Encounter: Payer: Self-pay | Admitting: *Deleted

## 2020-08-27 NOTE — Telephone Encounter (Signed)
Patient needs TCS w/ propofol, Dr. Abbey Chatters, ASA 3. Called pt and she has been scheduled for 1/4 at 11:00am. Aware will mail instructions.

## 2020-09-04 LAB — HEPATIC FUNCTION PANEL
ALT: 17 IU/L (ref 0–32)
AST: 13 IU/L (ref 0–40)
Albumin: 3.9 g/dL (ref 3.8–4.8)
Alkaline Phosphatase: 146 IU/L — ABNORMAL HIGH (ref 44–121)
Bilirubin Total: 0.4 mg/dL (ref 0.0–1.2)
Bilirubin, Direct: 0.11 mg/dL (ref 0.00–0.40)
Total Protein: 7.1 g/dL (ref 6.0–8.5)

## 2020-09-10 ENCOUNTER — Other Ambulatory Visit: Payer: Self-pay

## 2020-09-10 DIAGNOSIS — R748 Abnormal levels of other serum enzymes: Secondary | ICD-10-CM

## 2020-09-15 IMAGING — MR MR 3D RECON AT SCANNER
19 of 20 series · 44 of 48 positions shown · IV contrast (gadavist)
Comparison: Abdominal ultrasound of 03/23/2019

CLINICAL DATA: Elevated GGT and alkaline phosphatase levels with
dilated common bile duct on ultrasound.

EXAM:
MRI ABDOMEN WITHOUT AND WITH CONTRAST (INCLUDING MRCP)
TECHNIQUE: Multiplanar multisequence MR imaging of the abdomen was performed
both before and after the administration of intravenous contrast.
Heavily T2-weighted images of the biliary and pancreatic ducts were
obtained, and three-dimensional MRCP images were rendered by post
processing.
CONTRAST:  10mL GADAVIST GADOBUTROL 1 MMOL/ML IV SOLN

[Series 4: ax haste · axial · 6.0mm · 1.19mm/px · z∈[-154,+55]mm · 2 of 30 slices shown]
[im 1/30]
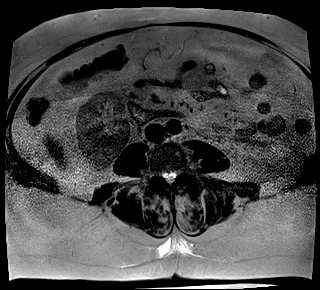
[im 30/30]
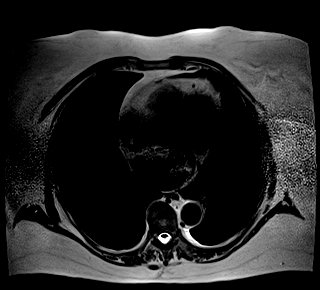

[Series 5: bSSFP · coronal · 6.0mm · 0.74mm/px · 1 of 38 slices shown]
[im 1/38]
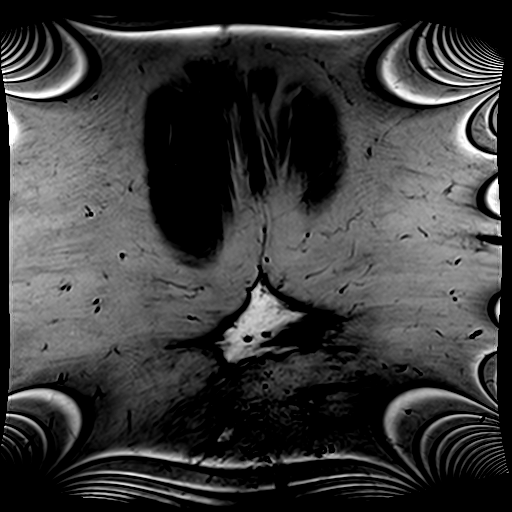

[Series 6: T2 fat-sat · axial · 6.0mm · 1.19mm/px · 1 of 36 slices shown]
[im 1/36]
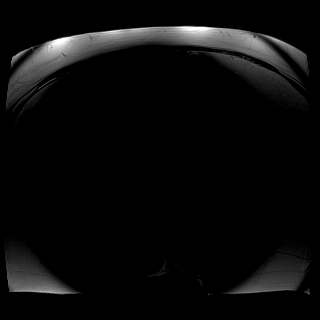

[Series 7: DWI · axial · 6.0mm · 1.42mm/px · 1 of 34 slices shown (1 of 4)]
[im 1/34]
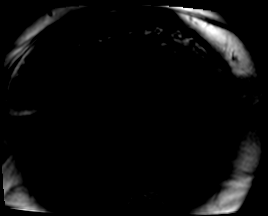

[Series 7: DWI · axial · 6.0mm · 1.42mm/px · 1 of 34 slices shown (2 of 4)]
[im 1/34]
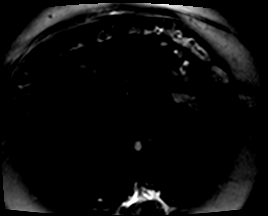

[Series 7: DWI · axial · 6.0mm · 1.42mm/px · 1 of 34 slices shown (3 of 4)]
[im 1/34]
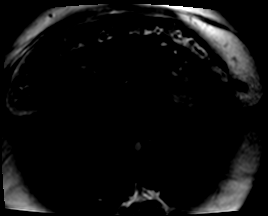

[Series 8: DWI · axial · 6.0mm · 1.42mm/px · 1 of 34 slices shown (4 of 4)]
[im 1/34]
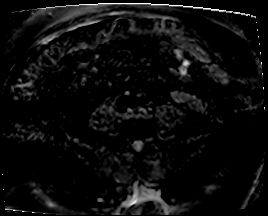

[Series 9: MRCP · coronal · 4.0mm · 1.12mm/px · 1 of 15 slices shown]
[im 1/15]
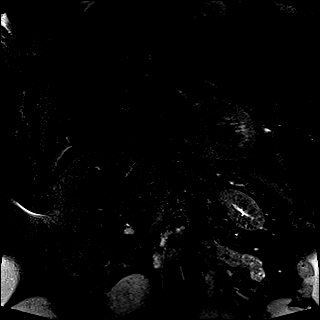

[Series 14: ax in and · axial · 3.0mm · 1.19mm/px · z∈[-159,+54]mm · 5 of 144 slices shown]
[im 1/144]
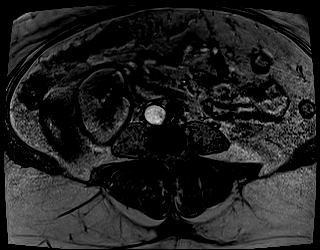
[im 36/144]
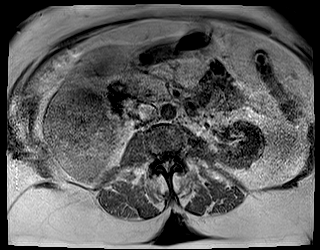
[im 72/144]
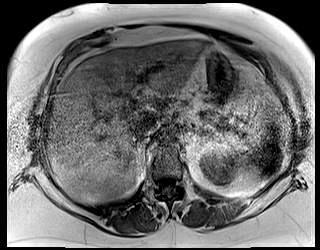
[im 108/144]
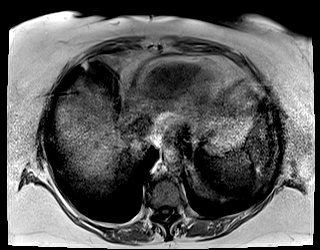
[im 144/144]
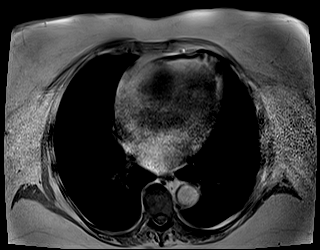

[Series 15: T1 dynamic · axial · non-contrast · 3.0mm · 1.25mm/px · z∈[-178,+59]mm · 3 of 80 slices shown (1 of 4)]
[im 1/80]
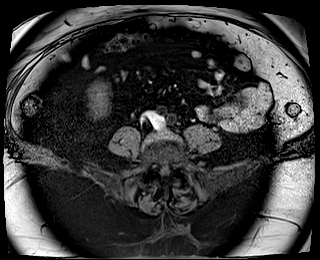
[im 40/80]
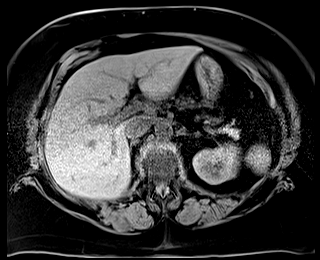
[im 80/80]
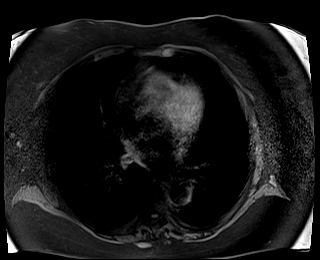

[Series 17: T1 dynamic post-contrast · axial · 3.0mm · 1.25mm/px · z∈[-178,+59]mm · 3 of 80 slices shown (1 of 6)]
[im 1/80]
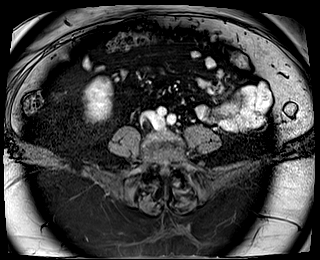
[im 40/80]
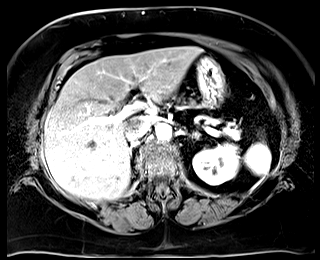
[im 80/80]
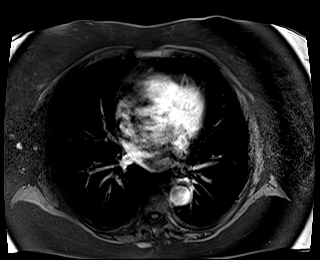

[Series 18: T1 dynamic · axial · 3.0mm · 1.25mm/px · z∈[-178,+59]mm · 3 of 80 slices shown (2 of 4)]
[im 1/80]
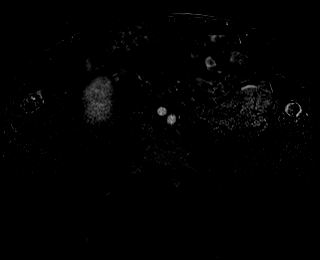
[im 40/80]
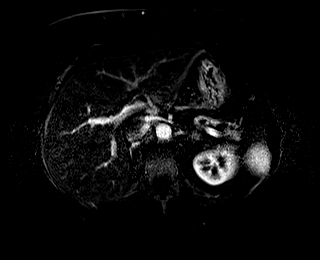
[im 80/80]
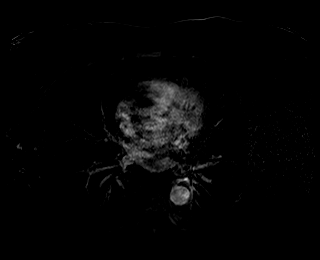

[Series 19: T1 dynamic post-contrast · axial · 3.0mm · 1.25mm/px · z∈[-178,+59]mm · 3 of 80 slices shown (2 of 6)]
[im 1/80]
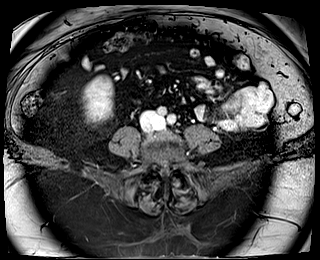
[im 40/80]
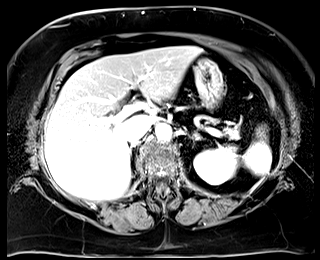
[im 80/80]
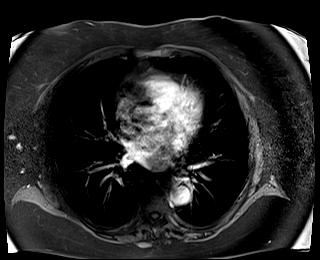

[Series 20: T1 dynamic · axial · 3.0mm · 1.25mm/px · z∈[-178,+59]mm · 3 of 80 slices shown (3 of 4)]
[im 1/80]
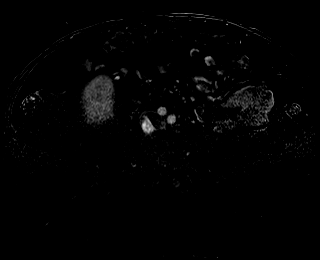
[im 40/80]
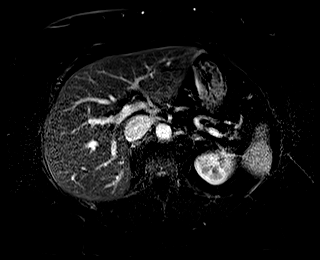
[im 80/80]
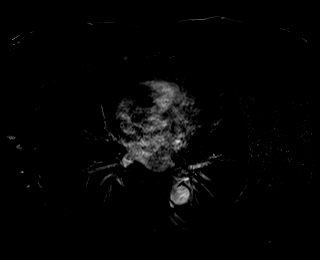

[Series 21: T1 dynamic post-contrast · axial · 3.0mm · 1.25mm/px · z∈[-178,+59]mm · 3 of 80 slices shown (3 of 6)]
[im 1/80]
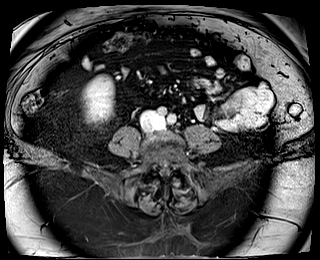
[im 40/80]
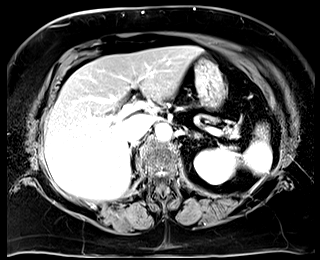
[im 80/80]
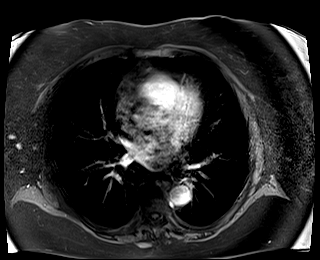

[Series 22: T1 dynamic · axial · 3.0mm · 1.25mm/px · z∈[-178,+59]mm · 3 of 80 slices shown (4 of 4)]
[im 1/80]
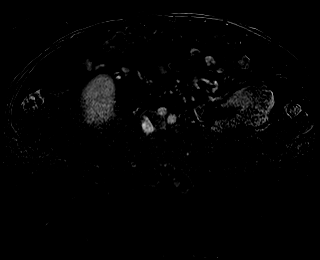
[im 40/80]
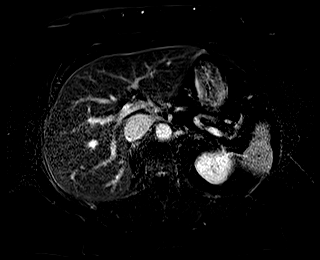
[im 80/80]
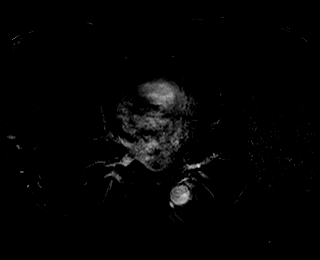

[Series 25: T1 dynamic post-contrast · coronal · 3.0mm · 1.31mm/px · 3 of 80 slices shown (4 of 6)]
[im 1/80]
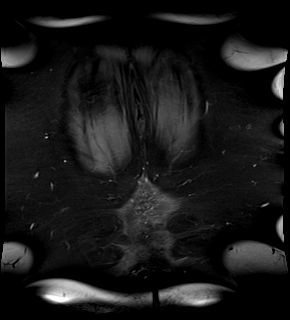
[im 40/80]
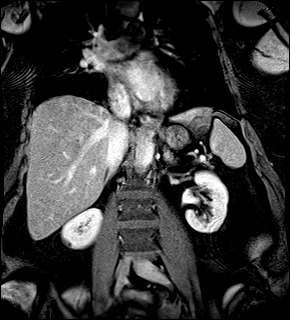
[im 80/80]
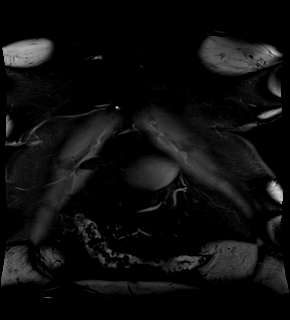

[Series 26: T1 dynamic post-contrast · axial · 3.0mm · 1.25mm/px · z∈[-178,+59]mm · 3 of 80 slices shown (5 of 6)]
[im 1/80]
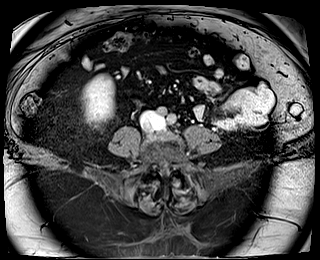
[im 40/80]
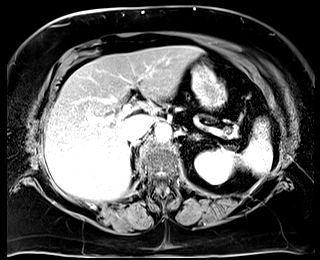
[im 80/80]
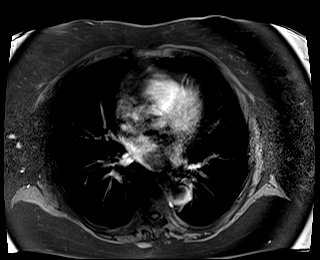

[Series 27: T1 dynamic post-contrast · axial · 3.0mm · 1.25mm/px · z∈[-178,+59]mm · 3 of 80 slices shown (6 of 6)]
[im 1/80]
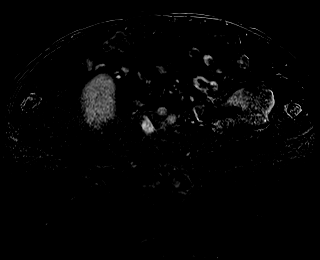
[im 40/80]
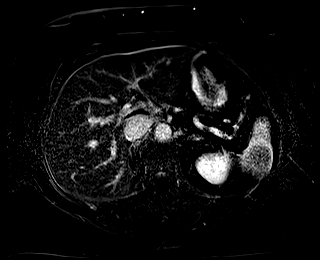
[im 80/80]
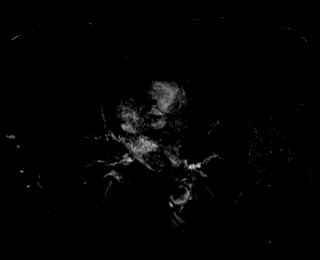

[44 of 48 positions shown; findings below may reference images not displayed]

FINDINGS: Despite efforts by the technologist and patient, motion artifact is
present on today's exam and could not be eliminated. This reduces
exam sensitivity and specificity. Body habitus reduces diagnostic
sensitivity and specificity.

Lower chest: Unremarkable

Hepatobiliary: Mild diffuse hepatic steatosis. The gallbladder is
absent.

There is minimal intrahepatic biliary dilatation with the common
hepatic duct measuring 1.3 cm in diameter and the common bile duct
measuring 0.9 cm in diameter.

A 5 mm stone is present in the common bile duct on image [DATE]. There
is also some distal truncation of the common bile duct making it
difficult to completely exclude the possibility of an ampullary
stone.

No significant focal hepatic lesion identified.

Pancreas:  Unremarkable

Spleen:  Unremarkable

Adrenals/Urinary Tract: The adrenal glands appear normal. A 0.9 cm
exophytic lesion from the left kidney lower pole has high
precontrast T1 signal characteristics but no enhancement compatible
with a small benign Bosniak category 2 cyst.

Stomach/Bowel: Unremarkable

Vascular/Lymphatic:  Unremarkable

Other:  No supplemental non-categorized findings.

Musculoskeletal: Unremarkable
IMPRESSION: 1. 5 mm stone in the common bile duct with minimal intrahepatic and
mild to moderate extrahepatic biliary dilatation. There is also some
distal truncation of the common bile duct making it difficult to
completely exclude the possibility of an ampullary stone.
2. Mild diffuse hepatic steatosis.
3. Small benign Bosniak category 2 cyst from the left kidney lower
pole.
4. Despite efforts by the technologist and patient, motion artifact
is present on today's exam and could not be eliminated. This reduces
exam sensitivity and specificity.

## 2020-09-18 LAB — GAMMA GT: GGT: 16 IU/L (ref 0–60)

## 2020-09-19 ENCOUNTER — Other Ambulatory Visit: Payer: Self-pay

## 2020-09-19 DIAGNOSIS — R7989 Other specified abnormal findings of blood chemistry: Secondary | ICD-10-CM

## 2020-10-04 NOTE — Patient Instructions (Signed)
   Your procedure is scheduled on: 10/09/2020  Report to [facility] at 9:00    AM.  Call this number if you have problems the morning of surgery: [phone]   Remember:              Follow Directions on the letter you received from Your Physician's office regarding the Bowel Prep              No Smoking the day of Procedure :   Take these medicines the morning of surgery with A SIP OF WATER: Omeprazole   Do not wear jewelry, make-up or nail polish.    Do not bring valuables to the hospital.  Contacts, dentures or bridgework may not be worn into surgery.  .   Patients discharged the day of surgery will not be allowed to drive home.     Colonoscopy, Adult, Care After This sheet gives you information about how to care for yourself after your procedure. Your health care provider may also give you more specific instructions. If you have problems or questions, contact your health care provider. What can I expect after the procedure? After the procedure, it is common to have:  A small amount of blood in your stool for 24 hours after the procedure.  Some gas.  Mild abdominal cramping or bloating.  Follow these instructions at home: General instructions   For the first 24 hours after the procedure: ? Do not drive or use machinery. ? Do not sign important documents. ? Do not drink alcohol. ? Do your regular daily activities at a slower pace than normal. ? Eat soft, easy-to-digest foods. ? Rest often.  Take over-the-counter or prescription medicines only as told by your health care provider.  It is up to you to get the results of your procedure. Ask your health care provider, or the department performing the procedure, when your results will be ready. Relieving cramping and bloating  Try walking around when you have cramps or feel bloated.  Apply heat to your abdomen as told by your health care provider. Use a heat source that your health care provider recommends, such as a  moist heat pack or a heating pad. ? Place a towel between your skin and the heat source. ? Leave the heat on for 20-30 minutes. ? Remove the heat if your skin turns bright red. This is especially important if you are unable to feel pain, heat, or cold. You may have a greater risk of getting burned. Eating and drinking  Drink enough fluid to keep your urine clear or pale yellow.  Resume your normal diet as instructed by your health care provider. Avoid heavy or fried foods that are hard to digest.  Avoid drinking alcohol for as long as instructed by your health care provider. Contact a health care provider if:  You have blood in your stool 2-3 days after the procedure. Get help right away if:  You have more than a small spotting of blood in your stool.  You pass large blood clots in your stool.  Your abdomen is swollen.  You have nausea or vomiting.  You have a fever.  You have increasing abdominal pain that is not relieved with medicine. This information is not intended to replace advice given to you by your health care provider. Make sure you discuss any questions you have with your health care provider. Document Released: 05/06/2004 Document Revised: 06/16/2016 Document Reviewed: 12/04/2015 Elsevier Interactive Patient Education  [employer].

## 2020-10-08 ENCOUNTER — Other Ambulatory Visit: Payer: Self-pay

## 2020-10-08 ENCOUNTER — Other Ambulatory Visit (HOSPITAL_COMMUNITY)
Admission: RE | Admit: 2020-10-08 | Discharge: 2020-10-08 | Disposition: A | Discharge status: Home / Self Care | Payer: Medicare HMO | Source: Ambulatory Visit | Attending: Internal Medicine | Admitting: Internal Medicine

## 2020-10-08 ENCOUNTER — Encounter (HOSPITAL_COMMUNITY)
Admission: RE | Admit: 2020-10-08 | Discharge: 2020-10-08 | Disposition: A | Discharge status: Home / Self Care | Payer: Medicare HMO | Source: Ambulatory Visit | Attending: Internal Medicine | Admitting: Internal Medicine

## 2020-10-08 ENCOUNTER — Encounter (HOSPITAL_COMMUNITY): Payer: Self-pay

## 2020-10-08 DIAGNOSIS — Z01812 Encounter for preprocedural laboratory examination: Secondary | ICD-10-CM | POA: Insufficient documentation

## 2020-10-08 DIAGNOSIS — Z20822 Contact with and (suspected) exposure to covid-19: Secondary | ICD-10-CM | POA: Insufficient documentation

## 2020-10-08 DIAGNOSIS — I10 Essential (primary) hypertension: Secondary | ICD-10-CM | POA: Insufficient documentation

## 2020-10-08 DIAGNOSIS — Z01810 Encounter for preprocedural cardiovascular examination: Secondary | ICD-10-CM | POA: Diagnosis present

## 2020-10-09 ENCOUNTER — Encounter (HOSPITAL_COMMUNITY): Payer: Self-pay | Admitting: *Deleted

## 2020-10-09 ENCOUNTER — Ambulatory Visit (HOSPITAL_COMMUNITY): Payer: Medicare HMO | Admitting: Certified Registered"

## 2020-10-09 ENCOUNTER — Ambulatory Visit (HOSPITAL_COMMUNITY)
Admission: RE | Admit: 2020-10-09 | Discharge: 2020-10-09 | Disposition: A | Discharge status: Home / Self Care | Payer: Medicare HMO | Attending: Internal Medicine | Admitting: Internal Medicine

## 2020-10-09 ENCOUNTER — Other Ambulatory Visit: Payer: Self-pay

## 2020-10-09 ENCOUNTER — Encounter (HOSPITAL_COMMUNITY)
Admission: RE | Disposition: A | Discharge status: Home / Self Care | Payer: Self-pay | Source: Home / Self Care | Attending: Internal Medicine

## 2020-10-09 DIAGNOSIS — Z79899 Other long term (current) drug therapy: Secondary | ICD-10-CM | POA: Insufficient documentation

## 2020-10-09 DIAGNOSIS — K648 Other hemorrhoids: Secondary | ICD-10-CM | POA: Diagnosis not present

## 2020-10-09 DIAGNOSIS — Z8249 Family history of ischemic heart disease and other diseases of the circulatory system: Secondary | ICD-10-CM | POA: Insufficient documentation

## 2020-10-09 DIAGNOSIS — Z8261 Family history of arthritis: Secondary | ICD-10-CM | POA: Diagnosis not present

## 2020-10-09 DIAGNOSIS — Z7982 Long term (current) use of aspirin: Secondary | ICD-10-CM | POA: Diagnosis not present

## 2020-10-09 DIAGNOSIS — D125 Benign neoplasm of sigmoid colon: Secondary | ICD-10-CM | POA: Insufficient documentation

## 2020-10-09 DIAGNOSIS — K635 Polyp of colon: Secondary | ICD-10-CM

## 2020-10-09 DIAGNOSIS — Z1211 Encounter for screening for malignant neoplasm of colon: Secondary | ICD-10-CM

## 2020-10-09 DIAGNOSIS — Z833 Family history of diabetes mellitus: Secondary | ICD-10-CM | POA: Insufficient documentation

## 2020-10-09 DIAGNOSIS — I10 Essential (primary) hypertension: Secondary | ICD-10-CM | POA: Diagnosis not present

## 2020-10-09 DIAGNOSIS — Z8673 Personal history of transient ischemic attack (TIA), and cerebral infarction without residual deficits: Secondary | ICD-10-CM | POA: Insufficient documentation

## 2020-10-09 DIAGNOSIS — K5730 Diverticulosis of large intestine without perforation or abscess without bleeding: Secondary | ICD-10-CM | POA: Diagnosis not present

## 2020-10-09 DIAGNOSIS — Z8719 Personal history of other diseases of the digestive system: Secondary | ICD-10-CM | POA: Diagnosis not present

## 2020-10-09 HISTORY — PX: POLYPECTOMY: SHX5525

## 2020-10-09 HISTORY — PX: COLONOSCOPY WITH PROPOFOL: SHX5780

## 2020-10-09 LAB — HEPATIC FUNCTION PANEL
ALT: 18 IU/L (ref 0–32)
AST: 15 IU/L (ref 0–40)
Albumin: 3.9 g/dL (ref 3.8–4.8)
Alkaline Phosphatase: 150 IU/L — ABNORMAL HIGH (ref 44–121)
Bilirubin Total: 0.6 mg/dL (ref 0.0–1.2)
Bilirubin, Direct: 0.15 mg/dL (ref 0.00–0.40)
Total Protein: 6.6 g/dL (ref 6.0–8.5)

## 2020-10-09 LAB — SARS CORONAVIRUS 2 (TAT 6-24 HRS): SARS Coronavirus 2: NEGATIVE

## 2020-10-09 SURGERY — COLONOSCOPY WITH PROPOFOL
Anesthesia: General

## 2020-10-09 MED ORDER — LACTATED RINGERS IV SOLN: INTRAVENOUS | Status: DC

## 2020-10-09 MED ORDER — LACTATED RINGERS IV SOLN: INTRAVENOUS | Status: DC | PRN

## 2020-10-09 MED ORDER — PROPOFOL 10 MG/ML IV BOLUS
INTRAVENOUS | Status: DC | PRN
  Administered 2020-10-09: 50 mg via INTRAVENOUS

## 2020-10-09 MED ORDER — PROPOFOL 500 MG/50ML IV EMUL
INTRAVENOUS | Status: DC | PRN
  Administered 2020-10-09: 150 ug/kg/min via INTRAVENOUS

## 2020-10-09 MED ORDER — STERILE WATER FOR IRRIGATION IR SOLN
Status: DC | PRN
  Administered 2020-10-09: 100 mL

## 2020-10-09 NOTE — Anesthesia Preprocedure Evaluation (Signed)
Anesthesia Evaluation  Patient identified by MRN, date of birth, ID band Patient awake    Reviewed: Allergy & Precautions, H&P , NPO status , Patient's Chart, lab work & pertinent test results, reviewed documented beta blocker date and time   Airway Mallampati: II  TM Distance: >3 FB Neck ROM: full    Dental no notable dental hx. (+) Teeth Intact   Pulmonary neg pulmonary ROS,    Pulmonary exam normal breath sounds clear to auscultation       Cardiovascular Exercise Tolerance: Good hypertension, + DOE   Rhythm:regular Rate:Normal     Neuro/Psych TIAnegative psych ROS   GI/Hepatic Neg liver ROS, GERD  Medicated,  Endo/Other  negative endocrine ROS  Renal/GU negative Renal ROS  negative genitourinary   Musculoskeletal   Abdominal   Peds  Hematology negative hematology ROS (+)   Anesthesia Other Findings   Reproductive/Obstetrics negative OB ROS                             Anesthesia Physical Anesthesia Plan  ASA: II  Anesthesia Plan: General   Post-op Pain Management:    Induction:   PONV Risk Score and Plan: Propofol infusion  Airway Management Planned:   Additional Equipment:   Intra-op Plan:   Post-operative Plan:   Informed Consent: I have reviewed the patients History and Physical, chart, labs and discussed the procedure including the risks, benefits and alternatives for the proposed anesthesia with the patient or authorized representative who has indicated his/her understanding and acceptance.     Dental Advisory Given  Plan Discussed with: CRNA  Anesthesia Plan Comments:         Anesthesia Quick Evaluation

## 2020-10-09 NOTE — H&P (Addendum)
Primary Care Physician:  Asencion Noble, MD Primary Gastroenterologist:  Dr. Abbey Chatters  Pre-Procedure History & Physical: HPI:  [name] is a 70 y.o. female is here for a colonoscopy for colon cancer screening purposes.  Patient denies any family history of colorectal cancer.  No melena or hematochezia.  No abdominal pain or unintentional weight loss.  No change in bowel habits.  Overall feels well from a GI standpoint.  Past Medical History:  Diagnosis Date  . GERD (gastroesophageal reflux disease)   . HTN (hypertension)   . Hyperlipidemia   . TIA (transient ischemic attack) 2004   no residual    Past Surgical History:  Procedure Laterality Date  . BIOPSY  10/28/2019   Procedure: BIOPSY;  Surgeon: Danie Binder, MD;  Location: [facility];  Service: Endoscopy;;  gastric   . BREAST REDUCTION SURGERY    . CHOLECYSTECTOMY N/A 02/29/2016   Procedure: LAPAROSCOPIC CHOLECYSTECTOMY;  Surgeon: Aviva Signs, MD;  Location: [facility];  Service: General;  Laterality: N/A;  . COLONOSCOPY  04/13/2006   ON:[id number] colon without evidence of polyps, masses, inflammatory changes diverticular or vascular ectasia/Normal retroflex view of the rectum  . ENDOSCOPIC RETROGRADE CHOLANGIOPANCREATOGRAPHY (ERCP) WITH PROPOFOL N/A 06/14/2020   Procedure: ENDOSCOPIC RETROGRADE CHOLANGIOPANCREATOGRAPHY (ERCP) WITH PROPOFOL;  Surgeon: Milus Banister, MD;  Location: [facility];  Service: Endoscopy;  Laterality: N/A;  . ESOPHAGOGASTRODUODENOSCOPY  2011   peptic stricture s/p dialtion, miuld gastritis likely NSAID related. No H. pylori  . ESOPHAGOGASTRODUODENOSCOPY N/A 10/28/2019   Procedure: ESOPHAGOGASTRODUODENOSCOPY (EGD);  Surgeon: Danie Binder, MD; Normal-appearing esophagus s/p empiric dilation, single gastric polyp resected and retrieved, mild gastritis s/p biopsy.  Pathology with fundic gland polyp.  Marland Kitchen JOINT REPLACEMENT    . REPLACEMENT TOTAL KNEE Bilateral   . SAVORY DILATION N/A 10/28/2019   Procedure:  SAVORY DILATION;  Surgeon: Danie Binder, MD;  Location: [facility];  Service: Endoscopy;  Laterality: N/A;  . SHOULDER ARTHROSCOPY WITH ROTATOR CUFF REPAIR AND SUBACROMIAL DECOMPRESSION Left 05/05/2019   Procedure: Left Shoulder Arthroscopy and Rotator Cuff Repair ACROMIOPLASTY;  Surgeon: Melrose Nakayama, MD;  Location: [place];  Service: Orthopedics;  Laterality: Left;  . SPHINCTEROTOMY  06/14/2020   Procedure: SPHINCTEROTOMY;  Surgeon: Milus Banister, MD;  Location: [facility];  Service: Endoscopy;;  balloon sweep    Prior to Admission medications   Medication Sig Start Date End Date Taking? Authorizing Provider  acetaminophen (TYLENOL) 500 MG tablet Take 1,000 mg by mouth every 6 (six) hours as needed for mild pain.   Yes [provider]  Coenzyme Q10 (COQ10) 200 MG CAPS Take 200 mg by mouth at bedtime.   Yes [provider]  omeprazole (PRILOSEC) 20 MG capsule 1 PO 30 MINS PRIOR TO BREAKFAST. Patient taking differently: Take 20 mg by mouth daily as needed (acid reflux). 11/08/19  Yes Fields, Sandi L, MD  oxybutynin (DITROPAN) 5 MG tablet Take 5 mg by mouth at bedtime.   Yes [provider]  phenylephrine-shark liver oil-mineral oil-petrolatum (PREPARATION H) 0.25-14-74.9 % rectal ointment Place 1 application rectally 2 (two) times daily as needed for hemorrhoids.   Yes [provider]  ramipril (ALTACE) 2.5 MG capsule Take 2.5 mg by mouth daily.   Yes [provider]  simvastatin (ZOCOR) 40 MG tablet Take 40 mg by mouth at bedtime.   Yes [provider]  aspirin 325 MG tablet Take 325 mg by mouth at bedtime.     [provider]  Allergies as of 08/27/2020  . (No Known Allergies)    Family History  Problem Relation Age of Onset  . Congestive Heart Failure Mother   . Diabetes Mother   . Hypertension Mother   . Diabetes Father   . Osteoarthritis Father   . Colon cancer Neg Hx     Social History    Socioeconomic History  . Marital status: Married    Spouse name: Not on file  . Number of children: Not on file  . Years of education: Not on file  . Highest education level: Not on file  Occupational History  . Occupation: [employer]    Employer: [employer]  Tobacco Use  . Smoking status: Never Smoker  . Smokeless tobacco: Never Used  Vaping Use  . Vaping Use: Never used  Substance and Sexual Activity  . Alcohol use: No  . Drug use: No  . Sexual activity: Not on file  Other Topics Concern  . Not on file  Social History Narrative  . Not on file   Social Determinants of Health   Financial Resource Strain: Not on file  Food Insecurity: Not on file  Transportation Needs: Not on file  Physical Activity: Not on file  Stress: Not on file  Social Connections: Not on file  Intimate Partner Violence: Not on file    Review of Systems: See HPI, otherwise negative ROS  Impression/Plan: [name] is here for a colonoscopy to be performed for colon cancer screening purposes.  The risks of the procedure including infection, bleed, or perforation as well as benefits, limitations, alternatives and imponderables have been reviewed with the patient. Questions have been answered. All parties agreeable.

## 2020-10-09 NOTE — Op Note (Signed)
[facility] Patient Name: [name] Procedure Date: 10/09/2020 10:20 AM MRN: [id number] Date of Birth: 1951/09/15 Attending MD: Elon Alas. Edgar Frisk CSN: [id number] Age: 70 Admit Type: Outpatient Procedure:                Colonoscopy Indications:              Screening for colorectal malignant neoplasm Providers:                Elon Alas. Abbey Chatters, DO, Lambert Mody, Aram Candela Referring MD:              Medicines:                See the Anesthesia note for documentation of the                            administered medications Complications:            No immediate complications. Estimated Blood Loss:     Estimated blood loss was minimal. Procedure:                Pre-Anesthesia Assessment:                           - The anesthesia plan was to use monitored                            anesthesia care (MAC).                           After obtaining informed consent, the colonoscope                            was passed under direct vision. Throughout the                            procedure, the patient's blood pressure, pulse, and                            oxygen saturations were monitored continuously. The                            [id number]-[id number] ([id number]) scope was introduced through                            the anus and advanced to the the cecum, identified                            by appendiceal orifice and ileocecal valve. The                            colonoscopy was performed without difficulty. The                            patient tolerated the procedure well. The quality  of the bowel preparation was evaluated using the                            BBPS [facility] Bowel Preparation Scale) with scores                            of: Right Colon = 2 (minor amount of residual                            staining, small fragments of stool and/or opaque                            liquid, but mucosa seen well),  Transverse Colon = 2                            (minor amount of residual staining, small fragments                            of stool and/or opaque liquid, but mucosa seen                            well) and Left Colon = 2 (minor amount of residual                            staining, small fragments of stool and/or opaque                            liquid, but mucosa seen well). The total BBPS score                            equals 6. The quality of the bowel preparation was                            fair. Scope In: 10:47:43 AM Scope Out: 10:58:17 AM Scope Withdrawal Time: 0 hours 8 minutes 13 seconds  Total Procedure Duration: 0 hours 10 minutes 34 seconds  Findings:      The perianal and digital rectal examinations were normal.      Non-bleeding internal hemorrhoids were found during retroflexion.      Multiple small-mouthed diverticula were found in the sigmoid colon and       descending colon.      A 6 mm polyp was found in the sigmoid colon. The polyp was sessile. The       polyp was removed with a cold snare. Resection and retrieval were       complete.      The exam was otherwise without abnormality. Impression:               - Preparation of the colon was fair.                           - Non-bleeding internal hemorrhoids.                           -  Diverticulosis in the sigmoid colon and in the                            descending colon.                           - One 6 mm polyp in the sigmoid colon, removed with                            a cold snare. Resected and retrieved.                           - The examination was otherwise normal. Moderate Sedation:      Per Anesthesia Care Recommendation:           - Patient has a contact number available for                            emergencies. The signs and symptoms of potential                            delayed complications were discussed with the                            patient. Return to normal activities  tomorrow.                            Written discharge instructions were provided to the                            patient.                           - Resume previous diet.                           - Continue present medications.                           - Await pathology results.                           - Repeat colonoscopy in 5 years for surveillance.                           - Return to GI clinic as previously scheduled. Procedure Code(s):        --- Professional ---                           [phone], Colonoscopy, flexible; with removal of                            tumor(s), polyp(s), or other lesion(s) by snare                            technique Diagnosis Code(s):        ---  Professional ---                           Z12.11, Encounter for screening for malignant                            neoplasm of colon                           K64.8, Other hemorrhoids                           K63.5, Polyp of colon                           K57.30, Diverticulosis of large intestine without                            perforation or abscess without bleeding CPT copyright 2019 American Medical Association. All rights reserved. The codes documented in this report are preliminary and upon coder review may  be revised to meet current compliance requirements. Elon Alas. Abbey Chatters, DO [place] Abbey Chatters, DO 10/09/2020 11:01:32 AM This report has been signed electronically. Number of Addenda: 0

## 2020-10-09 NOTE — Anesthesia Postprocedure Evaluation (Signed)
Anesthesia Post Note  Patient: [name]  Procedure(s) Performed: COLONOSCOPY WITH PROPOFOL (N/A ) POLYPECTOMY  Patient location during evaluation: PACU Anesthesia Type: General Level of consciousness: awake, oriented, awake and alert and patient cooperative Pain management: pain level controlled Vital Signs Assessment: post-procedure vital signs reviewed and stable Respiratory status: spontaneous breathing, respiratory function stable and nonlabored ventilation Cardiovascular status: stable Postop Assessment: no apparent nausea or vomiting Anesthetic complications: no   No complications documented.   Last Vitals:  Vitals:   10/09/20 0941  BP: (!) 146/75  Resp: 20  Temp: 36.7 C  SpO2: 98%    Last Pain:  Vitals:   10/09/20 1044  TempSrc:   PainSc: 0-No pain                 Shamiyah Ngu

## 2020-10-09 NOTE — Discharge Instructions (Signed)
°  Colonoscopy Discharge Instructions  Read the instructions outlined below and refer to this sheet in the next few weeks. These discharge instructions provide you with general information on caring for yourself after you leave the hospital. Your doctor may also give you specific instructions. While your treatment has been planned according to the most current medical practices available, unavoidable complications occasionally occur.   ACTIVITY You may resume your regular activity, but move at a slower pace for the next 24 hours.  Take frequent rest periods for the next 24 hours.  Walking will help get rid of the air and reduce the bloated feeling in your belly (abdomen).  No driving for 24 hours (because of the medicine (anesthesia) used during the test).   Do not sign any important legal documents or operate any machinery for 24 hours (because of the anesthesia used during the test).  NUTRITION Drink plenty of fluids.  You may resume your normal diet as instructed by your doctor.  Begin with a light meal and progress to your normal diet. Heavy or fried foods are harder to digest and may make you feel sick to your stomach (nauseated).  Avoid alcoholic beverages for 24 hours or as instructed.  MEDICATIONS You may resume your normal medications unless your doctor tells you otherwise.  WHAT YOU CAN EXPECT TODAY Some feelings of bloating in the abdomen.  Passage of more gas than usual.  Spotting of blood in your stool or on the toilet paper.  IF YOU HAD POLYPS REMOVED DURING THE COLONOSCOPY: No aspirin products for 7 days or as instructed.  No alcohol for 7 days or as instructed.  Eat a soft diet for the next 24 hours.  FINDING OUT THE RESULTS OF YOUR TEST Not all test results are available during your visit. If your test results are not back during the visit, make an appointment with your caregiver to find out the results. Do not assume everything is normal if you have not heard from your  caregiver or the medical facility. It is important for you to follow up on all of your test results.  SEEK IMMEDIATE MEDICAL ATTENTION IF: You have more than a spotting of blood in your stool.  Your belly is swollen (abdominal distention).  You are nauseated or vomiting.  You have a temperature over 101.  You have abdominal pain or discomfort that is severe or gets worse throughout the day.   Your colonoscopy revealed 1 polyp(s) which I removed successfully. Await pathology results, my office will contact you. I recommend repeating colonoscopy in 5 years for surveillance purposes.   You also have diverticulosis and internal hemorrhoids. I would recommend increasing fiber in your diet or adding OTC Benefiber/Metamucil. Be sure to drink at least 4 to 6 glasses of water daily. Follow-up with GI as previously scheduled.    I hope you have a great rest of your week!  Alyha Marines K. Jostin Rue, D.O. Gastroenterology and Hepatology [facility]  

## 2020-10-09 NOTE — Transfer of Care (Signed)
Immediate Anesthesia Transfer of Care Note  Patient: [name]  Procedure(s) Performed: COLONOSCOPY WITH PROPOFOL (N/A ) POLYPECTOMY  Patient Location: PACU  Anesthesia Type:General  Level of Consciousness: awake, alert , oriented and patient cooperative  Airway & Oxygen Therapy: Patient Spontanous Breathing  Post-op Assessment: Report given to RN, Post -op Vital signs reviewed and stable and Patient moving all extremities X 4  Post vital signs: Reviewed and stable  Last Vitals:  Vitals Value Taken Time  BP    Temp    Pulse    Resp 21 10/09/20 1105  SpO2    Vitals shown include unvalidated device data.  Last Pain:  Vitals:   10/09/20 1044  TempSrc:   PainSc: 0-No pain      Patients Stated Pain Goal: 7 (10/09/20 0941)  Complications: No complications documented.

## 2020-10-10 LAB — SURGICAL PATHOLOGY

## 2020-10-12 ENCOUNTER — Encounter (HOSPITAL_COMMUNITY): Payer: Self-pay | Admitting: Internal Medicine

## 2020-11-22 ENCOUNTER — Other Ambulatory Visit: Payer: Self-pay

## 2020-11-22 ENCOUNTER — Telehealth: Payer: Self-pay

## 2020-11-22 NOTE — Telephone Encounter (Signed)
Which medication does she need?

## 2020-11-22 NOTE — Progress Notes (Signed)
error 

## 2020-11-22 NOTE — Telephone Encounter (Signed)
RX called in to [facility] for compounded hemorrhoid cream with lidocaine apply anorectally bid for 2 weeks. With one refill.

## 2020-11-22 NOTE — Telephone Encounter (Signed)
error 

## 2020-11-22 NOTE — Telephone Encounter (Signed)
Pt needs refill sent to [facility] on [address].

## 2020-11-22 NOTE — Telephone Encounter (Signed)
I'm sorry.  It's for Apothecary Hemorrhoid Cream. Apply rectally x 2 daily as directed.

## 2020-11-22 NOTE — Telephone Encounter (Signed)
Phoned and advised the pt that her Rx has been phoned to [facility].

## 2021-02-21 ENCOUNTER — Ambulatory Visit: Payer: Medicare HMO | Admitting: Gastroenterology

## 2021-05-08 ENCOUNTER — Ambulatory Visit: Payer: Medicare HMO | Admitting: Physician Assistant

## 2021-05-16 ENCOUNTER — Telehealth: Payer: Self-pay | Admitting: Internal Medicine

## 2021-05-16 NOTE — Telephone Encounter (Signed)
Please let patient know if she is to have labs prior to her upcoming office visit.

## 2021-05-16 NOTE — Telephone Encounter (Signed)
Lets repeat an HFP fasting prior to her next visit as it has been >6 months since it was checked last. Please arrange. Dx: Elevated alk phos.

## 2021-05-16 NOTE — Telephone Encounter (Signed)
Will this pt need any other lab besides HFP before her next visit with you. I looked to see what was in the system that you ordered but I didn't see anything to release. Please advise

## 2021-05-17 ENCOUNTER — Other Ambulatory Visit: Payer: Self-pay

## 2021-05-17 DIAGNOSIS — R7989 Other specified abnormal findings of blood chemistry: Secondary | ICD-10-CM

## 2021-05-17 NOTE — Telephone Encounter (Signed)
Phoned the pt back and LM with her husband that I have put lab form in the mail to the pt to have done prior to her visit and that it is a fasting. Also wrote on a sticky note for the pt that this lab is a fasting and to have done prior to office visit.

## 2021-05-29 LAB — HEPATIC FUNCTION PANEL
AG Ratio: 1.3 (calc) (ref 1.0–2.5)
ALT: 14 U/L (ref 6–29)
AST: 12 U/L (ref 10–35)
Albumin: 3.6 g/dL (ref 3.6–5.1)
Alkaline phosphatase (APISO): 89 U/L (ref 37–153)
Bilirubin, Direct: 0.1 mg/dL (ref 0.0–0.2)
Globulin: 2.8 g/dL (calc) (ref 1.9–3.7)
Indirect Bilirubin: 0.4 mg/dL (calc) (ref 0.2–1.2)
Total Bilirubin: 0.5 mg/dL (ref 0.2–1.2)
Total Protein: 6.4 g/dL (ref 6.1–8.1)

## 2021-05-30 NOTE — Progress Notes (Signed)
Referring Provider: Asencion Noble, MD Primary Care Physician:  Asencion Noble, MD Primary GI Physician: Dr. Abbey Chatters  Chief Complaint  Patient presents with   Anal Itching   Gastroesophageal Reflux    Doing good     HPI:   [name] is a 70 y.o. female presenting today for routine follow-up.  History of fatty liver, elevated alk phos noted June 2020 with associated dyspepsia and nausea, ultimately found to have 5 mm stone in the CBD s/p ERCP September 2021 which revealed duodenal periampullary diverticulum, biliary dilation s/p biliary sphincterotomy and balloon sweeping though no stone extraction, suspected stone had already passed. Previously AMA, ANA, ASMA negative.  Also with history of GERD, dysphagia, intermittent diarrhea likely secondary to dietary intolerances with possible component of IBS exacerbated by stress. EGD on file January 2021 with normal esophagus s/p empiric dilation with clinical improvement, single benign gastric polyp, mild gastritis.  Colonoscopy updated January 2022 with nonbleeding internal hemorrhoids, diverticulosis in the sigmoid and descending colon, 6 mm tubular adenoma.  Recommended repeat in 5 years. She is presenting today for routine follow-up.   Last seen in our office 08/24/2020.  GERD well controlled on omeprazole 20 mg daily.  She was doing well after ERCP.  Denied nausea, vomiting, abdominal pain.  Reported history of hemorrhoids with some rectal itching and occasional burning.  Denies constipation.  Occasional diarrhea, but had not been much of a problem lately with limiting dairy products.  She did have a flare after eating a salad and noticed that when her hemorrhoids flared up.  Noticed a small amount of bright red blood on toilet tissue x1.  She was scheduled for colonoscopy, prescribed [facility] hemorrhoid cream, plan to recheck HFP, and continue omeprazole daily.  HFP 09/03/2020 with alk phos still elevated at 146.  AST, ALT, T bili within  normal limits. GGT 09/17/2020 within normal limits. HFP January 2022 with alk phos elevated at 150.  Recommended following up with PCP for further evaluation as GGT was previously normal. HFP 05/29/2021 entirely normal.  Alk phos normalized.  Today: GERD: Doing well with omeprazole 20 mg PRN.  Only required this a couple of times in the last 2 weeks.  Denies dysphagia, nausea, vomiting, abdominal pain.  She has been trying to follow a healthier diet.  Unfortunately, her husband has been in the hospital since 8/15 which has made it difficult to eat healthy.  She is hoping to get back to this soon.    Intermittent rectal itching well-controlled with hemorrhoid creams.  Denies diarrhea, constipation, hard stools, straining, rectal pain, bright red blood per rectum, or melena.  Past Medical History:  Diagnosis Date   GERD (gastroesophageal reflux disease)    HTN (hypertension)    Hyperlipidemia    TIA (transient ischemic attack) 2004   no residual    Past Surgical History:  Procedure Laterality Date   BIOPSY  10/28/2019   Procedure: BIOPSY;  Surgeon: Danie Binder, MD;  [facility]: [facility];  Service: Endoscopy;;  gastric    BREAST REDUCTION SURGERY     CHOLECYSTECTOMY N/A 02/29/2016   Procedure: LAPAROSCOPIC CHOLECYSTECTOMY;  Surgeon: Aviva Signs, MD;  Location: [facility];  Service: General;  Laterality: N/A;   COLONOSCOPY  04/13/2006   [id number] colon without evidence of polyps, masses, inflammatory changes diverticular or vascular ectasia/Normal retroflex view of the rectum   COLONOSCOPY WITH PROPOFOL N/A 10/09/2020   Surgeon: Eloise Harman, DO; nonbleeding internal hemorrhoids, diverticulosis in the sigmoid  and descending colon, 6 mm tubular adenoma.  Recommended repeat in 5 years.   ENDOSCOPIC RETROGRADE CHOLANGIOPANCREATOGRAPHY (ERCP) WITH PROPOFOL N/A 06/14/2020   Procedure: ENDOSCOPIC RETROGRADE CHOLANGIOPANCREATOGRAPHY (ERCP) WITH PROPOFOL;  Surgeon: Milus Banister,  MD;  Location: [facility];  Service: Endoscopy;  Laterality: N/A;   ESOPHAGOGASTRODUODENOSCOPY  2011   peptic stricture s/p dialtion, miuld gastritis likely NSAID related. No H. pylori   ESOPHAGOGASTRODUODENOSCOPY N/A 10/28/2019   Procedure: ESOPHAGOGASTRODUODENOSCOPY (EGD);  Surgeon: Danie Binder, MD; Normal-appearing esophagus s/p empiric dilation, single gastric polyp resected and retrieved, mild gastritis s/p biopsy.  Pathology with fundic gland polyp.   JOINT REPLACEMENT     POLYPECTOMY  10/09/2020   Procedure: POLYPECTOMY;  Surgeon: Eloise Harman, DO;  [facility];  Service: Endoscopy;;   REPLACEMENT TOTAL KNEE Bilateral    SAVORY DILATION N/A 10/28/2019   Procedure: SAVORY DILATION;  Surgeon: Danie Binder, MD;  [facility]: [facility];  Service: Endoscopy;  Laterality: N/A;   SHOULDER ARTHROSCOPY WITH ROTATOR CUFF REPAIR AND SUBACROMIAL DECOMPRESSION Left 05/05/2019   Procedure: Left Shoulder Arthroscopy and Rotator Cuff Repair ACROMIOPLASTY;  Surgeon: Melrose Nakayama, MD;  Location: [place];  Service: Orthopedics;  Laterality: Left;   SPHINCTEROTOMY  06/14/2020   Procedure: SPHINCTEROTOMY;  Surgeon: Milus Banister, MD;  Location: [facility];  Service: Endoscopy;;  balloon sweep    Current Outpatient Medications  Medication Sig Dispense Refill   acetaminophen (TYLENOL) 500 MG tablet Take 1,000 mg by mouth every 6 (six) hours as needed for mild pain.     aspirin 325 MG tablet Take 325 mg by mouth at bedtime.      Coenzyme Q10 (COQ10) 200 MG CAPS Take 200 mg by mouth at bedtime.     Lidocaine, Anorectal, (RECTICARE EX) Apply topically as needed.     oxybutynin (DITROPAN) 5 MG tablet Take 5 mg by mouth at bedtime.     ramipril (ALTACE) 2.5 MG capsule Take 2.5 mg by mouth daily.     simvastatin (ZOCOR) 40 MG tablet Take 40 mg by mouth at bedtime.     omeprazole (PRILOSEC) 20 MG capsule 1 PO 30 MINS PRIOR TO BREAKFAST. 90 capsule 3   No  current facility-administered medications for this visit.    Allergies as of 05/31/2021   (No Known Allergies)    Family History  Problem Relation Age of Onset   Congestive Heart Failure Mother    Diabetes Mother    Hypertension Mother    Diabetes Father    Osteoarthritis Father    Colon cancer Neg Hx     Social History   Socioeconomic History   Marital status: Married    Spouse name: Not on file   Number of children: Not on file   Years of education: Not on file   Highest education level: Not on file  Occupational History   Occupation: [employer]    Employer: [employer]  Tobacco Use   Smoking status: Never   Smokeless tobacco: Never  Vaping Use   Vaping Use: Never used  Substance and Sexual Activity   Alcohol use: No   Drug use: No   Sexual activity: Not on file  Other Topics Concern   Not on file  Social History Narrative   Not on file   Social Determinants of Health   Financial Resource Strain: Not on file  Food Insecurity: Not on file  Transportation Needs: Not on file  Physical Activity: Not  on file  Stress: Not on file  Social Connections: Not on file    Review of Systems: Gen: Denies fever, chills, cold or flulike symptoms, pre-syncope, syncope. CV: Denies chest pain, palpitations. Resp: Denies dyspnea or cough.  GI: See HPI Heme: See HPI  Physical Exam: BP 140/80   Pulse 65   Temp (!) 97.5 F (36.4 C) (Temporal)   Ht _0  (1.753 m)   Wt 284 lb 9.6 oz (129.1 kg)   BMI 42.03 kg/m  General:   Alert and oriented. No distress noted. Pleasant and cooperative.  Head:  Normocephalic and atraumatic. Eyes:  Conjuctiva clear without scleral icterus. Heart:  S1, S2 present without murmurs appreciated. Lungs:  Clear to auscultation bilaterally. No wheezes, rales, or rhonchi. No distress.  Abdomen:  +BS, soft, non-tender and non-distended. No rebound or guarding. No HSM or masses noted. Msk:  Symmetrical without gross  deformities. Normal posture. Extremities:  With trace bilateral LE pitting edema. Neurologic:  Alert and  oriented x4 Psych:  Normal mood and affect.    Assessment: 70 year old female with history of fatty liver, elevated alk phos in the setting of CBD stone s/p ERCP September 2021, GERD, dysphagia improved with empiric dilation in Jan 2021, intermittent diarrhea secondary to dietary intolerances and possibly component of IBS presenting today for routine follow-up.  GERD: Well-controlled on omeprazole as needed.  No alarm symptoms.  Elevated alk phos: In the setting of CBD stone s/p ERCP September 2021 which revealed duodenal periampullary diverticulum, biliary dilation s/p biliary sphincterotomy and balloon sweeping though no stone extraction, suspected stone had already passed. Previously AMA, ANA, ASMA negative. Alk phos remained elevated for a few months following ERCP, but ultimately normalized 05/29/2021.   Rectal itching: In the setting of known hemorrhoids.  Well-controlled with rectal creams.  Colonoscopy up-to-date January 2022 with nonbleeding internal hemorrhoids, diverticulosis, and 6 mm tubular adenoma, due for repeat in 2027.   Plan: 1.  Continue omeprazole 20 mg daily as needed. 2.  Reinforced GERD diet/lifestyle.  Written instructions provided. 3.  Continue rectal creams as needed for rectal itching/hemorrhoids. 4.  Follow-up in 1 year or sooner if needed.    Aliene Altes, PA-C [facility] 05/31/2021

## 2021-05-31 ENCOUNTER — Other Ambulatory Visit: Payer: Self-pay

## 2021-05-31 ENCOUNTER — Ambulatory Visit: Payer: Medicare HMO | Admitting: Gastroenterology

## 2021-05-31 ENCOUNTER — Telehealth: Payer: Self-pay | Admitting: Internal Medicine

## 2021-05-31 ENCOUNTER — Encounter: Payer: Self-pay | Admitting: Gastroenterology

## 2021-05-31 VITALS — BP 140/80 | HR 65 | Temp 97.5°F | Ht 69.0 in | Wt 284.6 lb

## 2021-05-31 DIAGNOSIS — L290 Pruritus ani: Secondary | ICD-10-CM | POA: Diagnosis not present

## 2021-05-31 DIAGNOSIS — R748 Abnormal levels of other serum enzymes: Secondary | ICD-10-CM | POA: Diagnosis not present

## 2021-05-31 DIAGNOSIS — K219 Gastro-esophageal reflux disease without esophagitis: Secondary | ICD-10-CM | POA: Diagnosis not present

## 2021-05-31 MED ORDER — OMEPRAZOLE 20 MG PO CPDR: DELAYED_RELEASE_CAPSULE | ORAL | 3 refills | Status: DC

## 2021-05-31 NOTE — Telephone Encounter (Signed)
Refill request received for Omeprazole 20 mg cap. 90 day supply. [facility] [place].

## 2021-05-31 NOTE — Addendum Note (Signed)
Addended by: Mahala Menghini on: 05/31/2021 11:23 AM   Modules accepted: Orders

## 2021-05-31 NOTE — Patient Instructions (Addendum)
Continue your omeprazole 20 mg as needed. If you start requiring this 3 times a week, recommend you start daily use.   Follow a GERD diet:  Avoid fried, fatty, greasy, spicy, citrus foods. Avoid caffeine and carbonated beverages. Avoid chocolate. Try eating 4-6 small meals a day rather than 3 large meals. Do not eat within 3 hours of laying down. Prop head of bed up on wood or bricks to create a 6 inch incline.  For intermittent rectal itching: This is secondary to hemorrhoids.  Continue to use rectal creams as needed.   It was good to see you today! I am sorry you are under a lot of stress at this time. You will be in my thoughts.   We will plan to see you in 1 year or sooner if needed.    Aliene Altes, PA-C [facility]

## 2021-06-03 ENCOUNTER — Other Ambulatory Visit: Payer: Self-pay

## 2021-06-04 ENCOUNTER — Telehealth: Payer: Self-pay

## 2021-06-04 NOTE — Telephone Encounter (Signed)
Phoned the pt to see if she has picked up her Omeprazole because a PA was not needed. The pt's mailbox was full and could not leave a message.

## 2022-01-27 ENCOUNTER — Encounter: Payer: Self-pay | Admitting: Gastroenterology

## 2022-01-27 ENCOUNTER — Ambulatory Visit: Payer: Medicare HMO | Admitting: Gastroenterology

## 2022-01-27 VITALS — BP 130/80 | HR 61 | Temp 97.7°F | Ht 69.0 in | Wt 276.2 lb

## 2022-01-27 DIAGNOSIS — M958 Other specified acquired deformities of musculoskeletal system: Secondary | ICD-10-CM | POA: Diagnosis not present

## 2022-01-27 DIAGNOSIS — R1011 Right upper quadrant pain: Secondary | ICD-10-CM | POA: Diagnosis not present

## 2022-01-27 DIAGNOSIS — R630 Anorexia: Secondary | ICD-10-CM | POA: Insufficient documentation

## 2022-01-27 DIAGNOSIS — R140 Abdominal distension (gaseous): Secondary | ICD-10-CM | POA: Diagnosis not present

## 2022-01-27 NOTE — Patient Instructions (Addendum)
Please have your blood work done at [facility].  We will touch base with you regarding results and determine next step as far as if imaging will be necessary. ?Take omeprazole 20 mg every morning before breakfast especially right now when you are having the symptoms.  Also can provide protection from ulcers in the setting of ibuprofen use. ?Continue probiotic daily for now. ?If symptoms persist, you may require CT scan of your abdomen. ?

## 2022-01-27 NOTE — Progress Notes (Signed)
? ? ? ?GI Office Note   ? ?Referring Provider: Asencion Noble, MD ?Primary Care Physician:  Asencion Noble, MD  ?Primary Gastroenterologist: Elon Alas. Abbey Chatters, DO ? ? ?Chief Complaint  ? ?Chief Complaint  ?Patient presents with  ? Bloated  ?  States that she feels full.   ? ? ?History of Present Illness  ? ?[name] is a 71 y.o. female presenting today for follow-up.  Last seen in the office back in August 2022. History of fatty liver, elevated alk phos noted June 2020 with associated dyspepsia and nausea, ultimately found to have 5 mm stone in the CBD s/p ERCP September 2021 which revealed duodenal periampullary diverticulum, biliary dilation s/p biliary sphincterotomy and balloon sweeping though no stone extraction, suspected stone had already passed. Previously AMA, ANA, ASMA negative.  Also with history of GERD, dysphagia, intermittent diarrhea likely secondary to dietary intolerances with possible component of IBS exacerbated by stress. EGD on file January 2021 with normal esophagus s/p empiric dilation with clinical improvement, single benign gastric polyp, mild gastritis.  Colonoscopy updated January 2022 with nonbleeding internal hemorrhoids, diverticulosis in the sigmoid and descending colon, 6 mm tubular adenoma. Recommended repeat in 5 years. She is presenting today for routine follow-up.  ?  ?Week after Easter, started having heaviness and bloating especially after meals, in the RUQ.  Stomach symptoms daily. She had not been on omeprazole every day before onset of symptoms but started taking daily and also added probiotic last week. Feeling a little better. Also has been using ibuprofen more regularly in setting of back pain/flare. She has has a couple of spells of diarrhea. Usually will have BM every day. No melena, brbpr. Does better with smaller portions of food. She notes that she never gets hungry. Down few pounds, 8 pounds in last month or so. Has been under a lot of stress. Husband hospitalized and  came home just around Easter.  ? ?  ?Medications  ? ?Current Outpatient Medications  ?Medication Sig Dispense Refill  ? aspirin 325 MG tablet Take 325 mg by mouth at bedtime.     ? Coenzyme Q10 (COQ10) 200 MG CAPS Take 200 mg by mouth at bedtime.    ? Lidocaine, Anorectal, (RECTICARE EX) Apply topically as needed.    ? omeprazole (PRILOSEC) 20 MG capsule 1 PO 30 MINS PRIOR TO BREAKFAST. 90 capsule 3  ? oxybutynin (DITROPAN) 5 MG tablet Take 5 mg by mouth at bedtime.    ? ramipril (ALTACE) 2.5 MG capsule Take 2.5 mg by mouth daily.    ? simvastatin (ZOCOR) 40 MG tablet Take 40 mg by mouth at bedtime.    ? ?No current facility-administered medications for this visit.  ? ? ?Allergies  ? ?Allergies as of 01/27/2022  ? (No Known Allergies)  ? ?    ?Review of Systems  ? ?General: Negative for fever, chills, fatigue, weakness. See hpi. ?ENT: Negative for hoarseness, difficulty swallowing , nasal congestion. ?CV: Negative for chest pain, angina, palpitations, dyspnea on exertion, peripheral edema.  ?Respiratory: Negative for dyspnea at rest, dyspnea on exertion, cough, sputum, wheezing.  ?GI: See history of present illness. ?GU:  Negative for dysuria, hematuria, urinary incontinence, urinary frequency, nocturnal urination.  ?Endo: Negative for unusual weight change.  ?   ?Physical Exam  ? ?BP 130/80 (BP Location: Right Arm, Patient Position: Sitting, Cuff Size: Large)   Pulse 61   Temp 97.7 ?F (36.5 ?C) (Temporal)   Ht '5\' 9"'  (1.753 m)   Wt  276 lb 3.2 oz (125.3 kg)   SpO2 100%   BMI 40.79 kg/m?  ?  ?General: Well-nourished, well-developed in no acute distress.  ?Eyes: No icterus. ?Mouth: Oropharyngeal mucosa moist and pink , no lesions erythema or exudate. ?Lungs: Clear to auscultation bilaterally.  ?Heart: Regular rate and rhythm, no murmurs rubs or gallops.  ?Abdomen: Bowel sounds are normal, nontender, nondistended, no hepatosplenomegaly or masses,  ?no abdominal bruits, no rebound or guarding. Rectus diastasis with  ?abd wall defect/hernia above umbilicus. ?Rectal: not performed ?Extremities: No lower extremity edema. No clubbing or deformities. ?Neuro: Alert and oriented x 4   ?Skin: Warm and dry, no jaundice.   ?Psych: Alert and cooperative, normal mood and affect. ? ?Labs  ? ?Lab Results  ?Component Value Date  ? ALT 14 05/29/2021  ? AST 12 05/29/2021  ? GGT 16 09/17/2020  ? ALKPHOS 89 05/29/2021  ? BILITOT 0.5 05/29/2021  ? ? ?Imaging Studies  ? ?No results found. ? ?Assessment  ? ?71 year old female with history of fatty liver, elevated alk phos in the setting of CBD stone s/p ERCP September 2021, GERD, dysphagia improved with empiric dilation in Jan 2021, intermittent diarrhea secondary to dietary intolerances and possibly component of IBS presenting today for routine follow-up. ? ?Right upper quadrant discomfort: Several week history of postprandial heaviness/bloating especially in the right upper quadrant.  No associated vomiting.  Typical reflux symptoms generally infrequent, using omeprazole only as needed but with recent symptoms she has been taking daily.  Using ibuprofen more regularly for a flare in her back pain.  Prior history of CBD stone as outlined.  For completeness will check labs to make sure no biliary process occurring.  Symptoms may be secondary to gastritis in the setting of NSAID use or dyspepsia exacerbated by increased stress recently.  She is concerned about diminished appetite, 8 pound weight loss more recently. ? ?Abnormal abdominal wall: Possible hernia above umbilicus and she also has diastases recti.  It is not clear that this is contributing to her symptoms.  May require further imaging. ? ?Elevated isolated alk phos: Noted in the setting of CBD stone.  Negative AMA, ANA, ASMA.  Alk phos remained elevated for several months after ERCP was eventually normalized.  Update labs. ? ?PLAN  ? ?CBC, CMET, Lipase, TTG IgA, IgA. ?May require CT A/P with contrast if persistent symptoms, await labs.   ?Continue omeprazole 20 mg every day especially right now since she is using ibuprofen for back pain. ?Continue probiotic daily. ?She will continue to monitor her weight at home.  She will let us know if she has any additional weight loss greater than 5 pounds. ? ? ?Laureen Ochs. Mada Sadik, MHS, PA-C ?[facility] ? ?

## 2022-01-28 LAB — COMPREHENSIVE METABOLIC PANEL
ALT: 14 IU/L (ref 0–32)
AST: 17 IU/L (ref 0–40)
Albumin/Globulin Ratio: 1.5 (ref 1.2–2.2)
Albumin: 4 g/dL (ref 3.8–4.8)
Alkaline Phosphatase: 135 IU/L — ABNORMAL HIGH (ref 44–121)
BUN/Creatinine Ratio: 14 (ref 12–28)
BUN: 10 mg/dL (ref 8–27)
Bilirubin Total: 0.4 mg/dL (ref 0.0–1.2)
CO2: 24 mmol/L (ref 20–29)
Calcium: 9.5 mg/dL (ref 8.7–10.3)
Chloride: 104 mmol/L (ref 96–106)
Creatinine, Ser: 0.73 mg/dL (ref 0.57–1.00)
Globulin, Total: 2.7 g/dL (ref 1.5–4.5)
Glucose: 107 mg/dL — ABNORMAL HIGH (ref 70–99)
Potassium: 4.3 mmol/L (ref 3.5–5.2)
Sodium: 141 mmol/L (ref 134–144)
Total Protein: 6.7 g/dL (ref 6.0–8.5)
eGFR: 89 mL/min/{1.73_m2} (ref >59)

## 2022-01-28 LAB — CBC WITH DIFFERENTIAL/PLATELET
Basophils Absolute: 0.1 10*3/uL (ref 0.0–0.2)
Basos: 1 %
EOS (ABSOLUTE): 0.2 10*3/uL (ref 0.0–0.4)
Eos: 2 %
Hematocrit: 44.1 % (ref 34.0–46.6)
Hemoglobin: 14.9 g/dL (ref 11.1–15.9)
Immature Grans (Abs): 0 10*3/uL (ref 0.0–0.1)
Immature Granulocytes: 0 %
Lymphocytes Absolute: 2.2 10*3/uL (ref 0.7–3.1)
Lymphs: 28 %
MCH: 29.4 pg (ref 26.6–33.0)
MCHC: 33.8 g/dL (ref 31.5–35.7)
MCV: 87 fL (ref 79–97)
Monocytes Absolute: 0.6 10*3/uL (ref 0.1–0.9)
Monocytes: 8 %
Neutrophils Absolute: 4.8 10*3/uL (ref 1.4–7.0)
Neutrophils: 61 %
Platelets: 262 10*3/uL (ref 150–450)
RBC: 5.07 x10E6/uL (ref 3.77–5.28)
RDW: 12.6 % (ref 11.7–15.4)
WBC: 7.8 10*3/uL (ref 3.4–10.8)

## 2022-01-28 LAB — TISSUE TRANSGLUTAMINASE, IGA: Transglutaminase IgA: <2 U/mL (ref 0–3)

## 2022-01-28 LAB — IGA: IgA/Immunoglobulin A, Serum: 257 mg/dL (ref 87–352)

## 2022-01-28 LAB — LIPASE: Lipase: 10 U/L — ABNORMAL LOW (ref 14–72)

## 2022-02-06 ENCOUNTER — Telehealth: Payer: Self-pay | Admitting: *Deleted

## 2022-02-06 NOTE — Telephone Encounter (Signed)
Pt called in for results.   ?

## 2022-02-06 NOTE — Telephone Encounter (Signed)
Informed patient that [name] is still reviewing them and once she has time to make notes on them and forwards them to me I will call her back. Pt verbalized understanding.  ?

## 2022-02-07 ENCOUNTER — Other Ambulatory Visit: Payer: Self-pay

## 2022-02-07 DIAGNOSIS — R7989 Other specified abnormal findings of blood chemistry: Secondary | ICD-10-CM

## 2022-02-07 NOTE — Telephone Encounter (Signed)
See result note.  

## 2022-04-22 ENCOUNTER — Encounter: Payer: Self-pay | Admitting: Internal Medicine

## 2022-04-23 NOTE — H&P (View-Only) (Signed)
  Referring Provider: Fagan, Roy, MD Primary Care Physician:  Fagan, Roy, MD Primary GI Physician: Dr. Carver  Chief Complaint  Patient presents with   Bloated    Feels like a knot at the top of stomach     HPI:   [name] is a 70 y.o. female with history of fatty liver, elevated alk phos in the setting of CBD stone s/p ERCP September 2021, GERD, dysphagia improved with empiric dilation in Jan 2021, intermittent diarrhea secondary to dietary intolerances and possibly component of IBS, hemorrhoids, adenomatous colon polyps, presenting today for follow-up of upper abdominal pain.   Last seen in our office in April 2023.  Reported several week history of postprandial heaviness/bloating especially in the right upper quadrant with no associated vomiting.  Typical reflux symptoms infrequent on omeprazole only as needed, but with recent symptoms, she has been taking daily.  Had been using ibuprofen more regularly for flare of back pain.  Suspected possible gastritis in the setting of NSAIDs or dyspepsia exacerbated by increased rest recently.  Patient was concerned about diminished appetite with 8 pound weight loss more recently.  On exam, she was noted to have possible hernia above umbilicus, diastases recti. Planned to update labs, use omeprazole 20 mg daily, consider CT if persistent symptoms.   Celiac screen negative.  No abnormalities on CBC or lipase.  CMP with alk phos mildly elevated at 135.  Recommended repeating LFTs, GGT, AMA in 3 months.  Can pursue CT if still having abdominal pain.  No imaging completed.   Today:  Feels like there is a knot in the epigastric area. Uncomfortable. Worse with sitting or leaning back. Feels like she can't breathe past it. Feels like she has eaten a big meal, but hasn't eaten anything. Not daily. Occurs 3-4 days a week. Not associated with meals. Just randomly comes on. No identifiable trigger. . Taking omeprazole daily. Rare heartburn. No dysphagia.  No nausea or vomiting. Sometimes she can walk around and symptoms will improve. Sometimes, having a BM will help. Bowels are moving well, daily to twice daily. No blood in the stool or black stools.   Reports somewhat similar to her symptoms when she had issues with her bile duct, but back then, her symptoms were more related to meals and she had nausea and vomiting.  Was taking ibuprofen, 4 daily, for about 1 month. Stopped 3 weeks ago, started back 1 week ago.  No further weight loss. Has been eating normally.   Past Medical History:  Diagnosis Date   GERD (gastroesophageal reflux disease)    HTN (hypertension)    Hyperlipidemia    TIA (transient ischemic attack) 2004   no residual    Past Surgical History:  Procedure Laterality Date   BIOPSY  10/28/2019   Procedure: BIOPSY;  Surgeon: Fields, Sandi L, MD;  [facility];  Service: Endoscopy;;  gastric    BREAST REDUCTION SURGERY     CHOLECYSTECTOMY N/A 02/29/2016   Procedure: LAPAROSCOPIC CHOLECYSTECTOMY;  Surgeon: Mark Jenkins, MD;  Location: [facility];  Service: General;  Laterality: N/A;   COLONOSCOPY  04/13/2006   SLF:Normal colon without evidence of polyps, masses, inflammatory changes diverticular or vascular ectasia/Normal retroflex view of the rectum   COLONOSCOPY WITH PROPOFOL N/A 10/09/2020   Surgeon: Carver, Charles K, DO; nonbleeding internal hemorrhoids, diverticulosis in the sigmoid and descending colon, 6 mm tubular adenoma.  Recommended repeat in 5 years.   ENDOSCOPIC RETROGRADE CHOLANGIOPANCREATOGRAPHY (ERCP) WITH PROPOFOL N/A 06/14/2020     Procedure: ENDOSCOPIC RETROGRADE CHOLANGIOPANCREATOGRAPHY (ERCP) WITH PROPOFOL;  Surgeon: Jacobs, Daniel P, MD;  [facility];  Service: Endoscopy;  Laterality: N/A;   ESOPHAGOGASTRODUODENOSCOPY  2011   peptic stricture s/p dialtion, miuld gastritis likely NSAID related. No H. pylori   ESOPHAGOGASTRODUODENOSCOPY N/A 10/28/2019   Procedure:  ESOPHAGOGASTRODUODENOSCOPY (EGD);  Surgeon: Fields, Sandi L, MD; Normal-appearing esophagus s/p empiric dilation, single gastric polyp resected and retrieved, mild gastritis s/p biopsy.  Pathology with fundic gland polyp.   JOINT REPLACEMENT     POLYPECTOMY  10/09/2020   Procedure: POLYPECTOMY;  Surgeon: Carver, Charles K, DO;  [facility];  Service: Endoscopy;;   REPLACEMENT TOTAL KNEE Bilateral    SAVORY DILATION N/A 10/28/2019   Procedure: SAVORY DILATION;  Surgeon: Fields, Sandi L, MD;  [facility];  Service: Endoscopy;  Laterality: N/A;   SHOULDER ARTHROSCOPY WITH ROTATOR CUFF REPAIR AND SUBACROMIAL DECOMPRESSION Left 05/05/2019   Procedure: Left Shoulder Arthroscopy and Rotator Cuff Repair ACROMIOPLASTY;  Surgeon: Dalldorf, Peter, MD;  Location: [facility];  Service: Orthopedics;  Laterality: Left;   SPHINCTEROTOMY  06/14/2020   Procedure: SPHINCTEROTOMY;  Surgeon: Jacobs, Daniel P, MD;  [facility];  Service: Endoscopy;;  balloon sweep    Current Outpatient Medications  Medication Sig Dispense Refill   aspirin 325 MG tablet Take 325 mg by mouth at bedtime.      Coenzyme Q10 (COQ10) 200 MG CAPS Take 200 mg by mouth at bedtime.     Lidocaine, Anorectal, (RECTICARE EX) Apply topically as needed.     omeprazole (PRILOSEC) 20 MG capsule 1 PO 30 MINS PRIOR TO BREAKFAST. 90 capsule 3   oxybutynin (DITROPAN) 5 MG tablet Take 5 mg by mouth at bedtime.     ramipril (ALTACE) 2.5 MG capsule Take 2.5 mg by mouth daily.     simvastatin (ZOCOR) 40 MG tablet Take 40 mg by mouth at bedtime.     No current facility-administered medications for this visit.    Allergies as of 04/24/2022   (No Known Allergies)    Family History  Problem Relation Age of Onset   Congestive Heart Failure Mother    Diabetes Mother    Hypertension Mother    Diabetes Father    Osteoarthritis Father    Colon cancer Neg Hx     Social History   Socioeconomic  History   Marital status: Married    Spouse name: Not on file   Number of children: Not on file   Years of education: Not on file   Highest education level: Not on file  Occupational History   Occupation: [employer]    Employer: [employer]  Tobacco Use   Smoking status: Never   Smokeless tobacco: Never  Vaping Use   Vaping Use: Never used  Substance and Sexual Activity   Alcohol use: No   Drug use: No   Sexual activity: Not Currently  Other Topics Concern   Not on file  Social History Narrative   Not on file   Social Determinants of Health   Financial Resource Strain: Not on file  Food Insecurity: Not on file  Transportation Needs: Not on file  Physical Activity: Not on file  Stress: Not on file  Social Connections: Not on file    Review of Systems: Gen: Denies fever, chills, cold or flu like symptoms, pre-syncope, or syncope.  CV: Denies chest pain, palpitations. Resp: Denies dyspnea, cough.  GI:See HPI Heme: See HPI  Physical   Exam: BP 133/67 (BP Location: Left Arm, Patient Position: Sitting, Cuff Size: Large)   Pulse 69   Temp 97.9 F (36.6 C) (Temporal)   Ht 5' 9" (1.753 m)   Wt 278 lb 12.8 oz (126.5 kg)   BMI 41.17 kg/m  General:   Alert and oriented. No distress noted. Pleasant and cooperative.  Head:  Normocephalic and atraumatic. Eyes:  Conjuctiva clear without scleral icterus. Heart:  S1, S2 present without murmurs appreciated. Lungs:  Clear to auscultation bilaterally. No wheezes, rales, or rhonchi. No distress.  Abdomen:  +BS, soft, and non-distended. Focal area of TTP about 2 inches in diameter in the epigastric area. No other pain. No appreciable hernia though she does appear to have diastasis rectus. No rebound or guarding. No HSM or masses noted. Msk:  Symmetrical without gross deformities. Normal posture. Extremities:  With 2+ edema up to mid shin. Neurologic:  Alert and  oriented x4 Psych:  Normal mood and  affect.    Assessment:  70 y.o. female with history of fatty liver, elevated alk phos in the setting of CBD stone s/p ERCP September 2021, GERD, dysphagia improved with empiric dilation in Jan 2021, intermittent diarrhea secondary to dietary intolerances and possibly component of IBS, hemorrhoids, adenomatous colon polyps, presenting today for follow-up upper abdominal pain.   Epigastric abdominal pain:  New onset around March this year described as a "knot/fullness" in the epigastric area occurring 3-4 times a week, worse with sitting or laying down, improved with walking and sometimes by a BM. Denies association with meals, nausea, vomiting, weight loss, bbrpr, melena, constipation. She has been taking ibuprofen for shoulder pain. GERD well controlled on omeprazole 20 mg daily. Labs for evaluation of similar symptoms in April only with slight elevation of alk phos, celiac screen negative. Clinically, symptoms do not sound biliary in nature. Somewhat less consistent with gastritis and PUD as there is no relation to meals, but she is at risk for this in the setting of NSAID use. On exam she has focal area of TTP about 2 inches in diameter in the epigastric area. No other pain. No appreciable hernia though she does appear to have diastasis rectus. She needs additional evaluation. Discussed EGD vs CT. Ultimately decided to pursue CT and if negative will circle back to EGD.   Elevated alkaline phosphatase: Chronic.  Previously in the setting of CBD stone s/p ERCP in September 2021. Previously AMA, ANA, ASMA negative. Alk phos normalized in August 2022.  Most recent labs April 2023 with alk phos 135.  Query whether mild elevation is related to fatty liver.  She is having some intermittent discomfort in the epigastric area discussed above, but symptoms do not sound biliary in nature. We will recheck LFTs, GGT, AMA.  We are also proceeding with CT of her abdomen to further evaluate epigastric pain.  As well to  ensure no significant biliary duct abnormalities.   Plan:  CT abdomen with contrast. HFP, GGT, AMA fasting Continue omeprazole 20 mg daily. Limit ibuprofen is much as possible and avoid other oral NSAIDs.  Recommended utilizing Voltaren gel more frequently.  May also use Tylenol as needed for pain though patient reports this is less effective. Further recommendations to follow.   Nakeyia Menden, PA-C [facility] 04/24/2022  

## 2022-04-23 NOTE — Progress Notes (Signed)
Referring Provider: Asencion Noble, MD Primary Care Physician:  Asencion Noble, MD Primary GI Physician: Dr. Abbey Chatters  Chief Complaint  Patient presents with   [facility] like a knot at the top of stomach     HPI:   [name] is a 71 y.o. female with history of fatty liver, elevated alk phos in the setting of CBD stone s/p ERCP September 2021, GERD, dysphagia improved with empiric dilation in Jan 2021, intermittent diarrhea secondary to dietary intolerances and possibly component of IBS, hemorrhoids, adenomatous colon polyps, presenting today for follow-up of upper abdominal pain.   Last seen in our office in April 2023.  Reported several week history of postprandial heaviness/bloating especially in the right upper quadrant with no associated vomiting.  Typical reflux symptoms infrequent on omeprazole only as needed, but with recent symptoms, she has been taking daily.  Had been using ibuprofen more regularly for flare of back pain.  Suspected possible gastritis in the setting of NSAIDs or dyspepsia exacerbated by increased rest recently.  Patient was concerned about diminished appetite with 8 pound weight loss more recently.  On exam, she was noted to have possible hernia above umbilicus, diastases recti. Planned to update labs, use omeprazole 20 mg daily, consider CT if persistent symptoms.   Celiac screen negative.  No abnormalities on CBC or lipase.  CMP with alk phos mildly elevated at 135.  Recommended repeating LFTs, GGT, AMA in 3 months.  Can pursue CT if still having abdominal pain.  No imaging completed.   Today:  Feels like there is a knot in the epigastric area. Uncomfortable. Worse with sitting or leaning back. Feels like she can't breathe past it. Feels like she has eaten a big meal, but hasn't eaten anything. Not daily. Occurs 3-4 days a week. Not associated with meals. Just randomly comes on. No identifiable trigger. . Taking omeprazole daily. Rare heartburn. No dysphagia.  No nausea or vomiting. Sometimes she can walk around and symptoms will improve. Sometimes, having a BM will help. Bowels are moving well, daily to twice daily. No blood in the stool or black stools.   Reports somewhat similar to her symptoms when she had issues with her bile duct, but back then, her symptoms were more related to meals and she had nausea and vomiting.  Was taking ibuprofen, 4 daily, for about 1 month. Stopped 3 weeks ago, started back 1 week ago.  No further weight loss. Has been eating normally.   Past Medical History:  Diagnosis Date   GERD (gastroesophageal reflux disease)    HTN (hypertension)    Hyperlipidemia    TIA (transient ischemic attack) 2004   no residual    Past Surgical History:  Procedure Laterality Date   BIOPSY  10/28/2019   Procedure: BIOPSY;  Surgeon: Danie Binder, MD;  [facility];  Service: Endoscopy;;  gastric    BREAST REDUCTION SURGERY     CHOLECYSTECTOMY N/A 02/29/2016   Procedure: LAPAROSCOPIC CHOLECYSTECTOMY;  Surgeon: Aviva Signs, MD;  Location: [facility];  Service: General;  Laterality: N/A;   COLONOSCOPY  04/13/2006   UOH:FGBMSX colon without evidence of polyps, masses, inflammatory changes diverticular or vascular ectasia/Normal retroflex view of the rectum   COLONOSCOPY WITH PROPOFOL N/A 10/09/2020   Surgeon: Eloise Harman, DO; nonbleeding internal hemorrhoids, diverticulosis in the sigmoid and descending colon, 6 mm tubular adenoma.  Recommended repeat in 5 years.   ENDOSCOPIC RETROGRADE CHOLANGIOPANCREATOGRAPHY (ERCP) WITH PROPOFOL N/A 06/14/2020  Procedure: ENDOSCOPIC RETROGRADE CHOLANGIOPANCREATOGRAPHY (ERCP) WITH PROPOFOL;  Surgeon: Milus Banister, MD;  [facility];  Service: Endoscopy;  Laterality: N/A;   ESOPHAGOGASTRODUODENOSCOPY  2011   peptic stricture s/p dialtion, miuld gastritis likely NSAID related. No H. pylori   ESOPHAGOGASTRODUODENOSCOPY N/A 10/28/2019   Procedure:  ESOPHAGOGASTRODUODENOSCOPY (EGD);  Surgeon: Danie Binder, MD; Normal-appearing esophagus s/p empiric dilation, single gastric polyp resected and retrieved, mild gastritis s/p biopsy.  Pathology with fundic gland polyp.   JOINT REPLACEMENT     POLYPECTOMY  10/09/2020   Procedure: POLYPECTOMY;  Surgeon: Eloise Harman, DO;  [facility];  Service: Endoscopy;;   REPLACEMENT TOTAL KNEE Bilateral    SAVORY DILATION N/A 10/28/2019   Procedure: SAVORY DILATION;  Surgeon: Danie Binder, MD;  [facility];  Service: Endoscopy;  Laterality: N/A;   SHOULDER ARTHROSCOPY WITH ROTATOR CUFF REPAIR AND SUBACROMIAL DECOMPRESSION Left 05/05/2019   Procedure: Left Shoulder Arthroscopy and Rotator Cuff Repair ACROMIOPLASTY;  Surgeon: Melrose Nakayama, MD;  Location: [facility];  Service: Orthopedics;  Laterality: Left;   SPHINCTEROTOMY  06/14/2020   Procedure: SPHINCTEROTOMY;  Surgeon: Milus Banister, MD;  [facility];  Service: Endoscopy;;  balloon sweep    Current Outpatient Medications  Medication Sig Dispense Refill   aspirin 325 MG tablet Take 325 mg by mouth at bedtime.      Coenzyme Q10 (COQ10) 200 MG CAPS Take 200 mg by mouth at bedtime.     Lidocaine, Anorectal, (RECTICARE EX) Apply topically as needed.     omeprazole (PRILOSEC) 20 MG capsule 1 PO 30 MINS PRIOR TO BREAKFAST. 90 capsule 3   oxybutynin (DITROPAN) 5 MG tablet Take 5 mg by mouth at bedtime.     ramipril (ALTACE) 2.5 MG capsule Take 2.5 mg by mouth daily.     simvastatin (ZOCOR) 40 MG tablet Take 40 mg by mouth at bedtime.     No current facility-administered medications for this visit.    Allergies as of 04/24/2022   (No Known Allergies)    Family History  Problem Relation Age of Onset   Congestive Heart Failure Mother    Diabetes Mother    Hypertension Mother    Diabetes Father    Osteoarthritis Father    Colon cancer Neg Hx     Social History   Socioeconomic  History   Marital status: Married    Spouse name: Not on file   Number of children: Not on file   Years of education: Not on file   Highest education level: Not on file  Occupational History   Occupation: [employer]    Employer: [employer]  Tobacco Use   Smoking status: Never   Smokeless tobacco: Never  Vaping Use   Vaping Use: Never used  Substance and Sexual Activity   Alcohol use: No   Drug use: No   Sexual activity: Not Currently  Other Topics Concern   Not on file  Social History Narrative   Not on file   Social Determinants of Health   Financial Resource Strain: Not on file  Food Insecurity: Not on file  Transportation Needs: Not on file  Physical Activity: Not on file  Stress: Not on file  Social Connections: Not on file    Review of Systems: Gen: Denies fever, chills, cold or flu like symptoms, pre-syncope, or syncope.  CV: Denies chest pain, palpitations. Resp: Denies dyspnea, cough.  GI:See HPI Heme: See HPI  Physical  Exam: BP 133/67 (BP Location: Left Arm, Patient Position: Sitting, Cuff Size: Large)   Pulse 69   Temp 97.9 F (36.6 C) (Temporal)   Ht _0  (1.753 m)   Wt 278 lb 12.8 oz (126.5 kg)   BMI 41.17 kg/m  General:   Alert and oriented. No distress noted. Pleasant and cooperative.  Head:  Normocephalic and atraumatic. Eyes:  Conjuctiva clear without scleral icterus. Heart:  S1, S2 present without murmurs appreciated. Lungs:  Clear to auscultation bilaterally. No wheezes, rales, or rhonchi. No distress.  Abdomen:  +BS, soft, and non-distended. Focal area of TTP about 2 inches in diameter in the epigastric area. No other pain. No appreciable hernia though she does appear to have diastasis rectus. No rebound or guarding. No HSM or masses noted. Msk:  Symmetrical without gross deformities. Normal posture. Extremities:  With 2+ edema up to mid shin. Neurologic:  Alert and  oriented x4 Psych:  Normal mood and  affect.    Assessment:  71 y.o. female with history of fatty liver, elevated alk phos in the setting of CBD stone s/p ERCP September 2021, GERD, dysphagia improved with empiric dilation in Jan 2021, intermittent diarrhea secondary to dietary intolerances and possibly component of IBS, hemorrhoids, adenomatous colon polyps, presenting today for follow-up upper abdominal pain.   Epigastric abdominal pain:  New onset around March this year described as a "knot/fullness" in the epigastric area occurring 3-4 times a week, worse with sitting or laying down, improved with walking and sometimes by a BM. Denies association with meals, nausea, vomiting, weight loss, bbrpr, melena, constipation. She has been taking ibuprofen for shoulder pain. GERD well controlled on omeprazole 20 mg daily. Labs for evaluation of similar symptoms in April only with slight elevation of alk phos, celiac screen negative. Clinically, symptoms do not sound biliary in nature. Somewhat less consistent with gastritis and PUD as there is no relation to meals, but she is at risk for this in the setting of NSAID use. On exam she has focal area of TTP about 2 inches in diameter in the epigastric area. No other pain. No appreciable hernia though she does appear to have diastasis rectus. She needs additional evaluation. Discussed EGD vs CT. Ultimately decided to pursue CT and if negative will circle back to EGD.   Elevated alkaline phosphatase: Chronic.  Previously in the setting of CBD stone s/p ERCP in September 2021. Previously AMA, ANA, ASMA negative. Alk phos normalized in August 2022.  Most recent labs April 2023 with alk phos 135.  Query whether mild elevation is related to fatty liver.  She is having some intermittent discomfort in the epigastric area discussed above, but symptoms do not sound biliary in nature. We will recheck LFTs, GGT, AMA.  We are also proceeding with CT of her abdomen to further evaluate epigastric pain.  As well to  ensure no significant biliary duct abnormalities.   Plan:  CT abdomen with contrast. HFP, GGT, AMA fasting Continue omeprazole 20 mg daily. Limit ibuprofen is much as possible and avoid other oral NSAIDs.  Recommended utilizing Voltaren gel more frequently.  May also use Tylenol as needed for pain though patient reports this is less effective. Further recommendations to follow.   Aliene Altes, PA-C [facility] 04/24/2022

## 2022-04-24 ENCOUNTER — Other Ambulatory Visit: Payer: Self-pay

## 2022-04-24 ENCOUNTER — Encounter: Payer: Self-pay | Admitting: *Deleted

## 2022-04-24 ENCOUNTER — Ambulatory Visit: Payer: Medicare HMO | Admitting: Gastroenterology

## 2022-04-24 ENCOUNTER — Encounter: Payer: Self-pay | Admitting: Gastroenterology

## 2022-04-24 VITALS — BP 133/67 | HR 69 | Temp 97.9°F | Ht 69.0 in | Wt 278.8 lb

## 2022-04-24 DIAGNOSIS — K219 Gastro-esophageal reflux disease without esophagitis: Secondary | ICD-10-CM | POA: Diagnosis not present

## 2022-04-24 DIAGNOSIS — R7989 Other specified abnormal findings of blood chemistry: Secondary | ICD-10-CM

## 2022-04-24 DIAGNOSIS — R1013 Epigastric pain: Secondary | ICD-10-CM | POA: Diagnosis not present

## 2022-04-24 DIAGNOSIS — R748 Abnormal levels of other serum enzymes: Secondary | ICD-10-CM | POA: Diagnosis not present

## 2022-04-24 NOTE — Patient Instructions (Signed)
We will arrange to have a CT scan of your abdomen at [facility].  Please have labs completed at [place].   We will call you with results and recommendations.  For now, continue omeprazole 20 mg every day 30 minutes before breakfast.  Avoid ibuprofen is much as possible and utilize Voltaren gel rather than oral NSAID products.    Aliene Altes, PA-C [facility]

## 2022-04-29 ENCOUNTER — Ambulatory Visit (HOSPITAL_COMMUNITY)
Admission: RE | Admit: 2022-04-29 | Discharge: 2022-04-29 | Disposition: A | Discharge status: Home / Self Care | Payer: Medicare HMO | Source: Ambulatory Visit | Attending: Gastroenterology | Admitting: Gastroenterology

## 2022-04-29 ENCOUNTER — Encounter (HOSPITAL_COMMUNITY): Payer: Self-pay

## 2022-04-29 DIAGNOSIS — M5136 Other intervertebral disc degeneration, lumbar region: Secondary | ICD-10-CM | POA: Diagnosis not present

## 2022-04-29 DIAGNOSIS — N281 Cyst of kidney, acquired: Secondary | ICD-10-CM | POA: Diagnosis not present

## 2022-04-29 DIAGNOSIS — K219 Gastro-esophageal reflux disease without esophagitis: Secondary | ICD-10-CM | POA: Diagnosis not present

## 2022-04-29 DIAGNOSIS — M48061 Spinal stenosis, lumbar region without neurogenic claudication: Secondary | ICD-10-CM | POA: Insufficient documentation

## 2022-04-29 DIAGNOSIS — Z9049 Acquired absence of other specified parts of digestive tract: Secondary | ICD-10-CM | POA: Insufficient documentation

## 2022-04-29 DIAGNOSIS — M4316 Spondylolisthesis, lumbar region: Secondary | ICD-10-CM | POA: Diagnosis not present

## 2022-04-29 DIAGNOSIS — I700 Atherosclerosis of aorta: Secondary | ICD-10-CM | POA: Insufficient documentation

## 2022-04-29 MED ORDER — IOHEXOL 350 MG/ML SOLN
100.0000 mL | Freq: Once | INTRAVENOUS | Status: AC | PRN
  Administered 2022-04-29: 100 mL via INTRAVENOUS

## 2022-04-30 ENCOUNTER — Other Ambulatory Visit: Payer: Self-pay | Admitting: *Deleted

## 2022-04-30 ENCOUNTER — Telehealth: Payer: Self-pay | Admitting: *Deleted

## 2022-04-30 DIAGNOSIS — R9389 Abnormal findings on diagnostic imaging of other specified body structures: Secondary | ICD-10-CM

## 2022-04-30 LAB — HEPATIC FUNCTION PANEL
AG Ratio: 1.4 (calc) (ref 1.0–2.5)
ALT: 14 U/L (ref 6–29)
AST: 14 U/L (ref 10–35)
Albumin: 3.9 g/dL (ref 3.6–5.1)
Alkaline phosphatase (APISO): 95 U/L (ref 37–153)
Bilirubin, Direct: 0.1 mg/dL (ref 0.0–0.2)
Globulin: 2.7 g/dL (calc) (ref 1.9–3.7)
Indirect Bilirubin: 0.5 mg/dL (calc) (ref 0.2–1.2)
Total Bilirubin: 0.6 mg/dL (ref 0.2–1.2)
Total Protein: 6.6 g/dL (ref 6.1–8.1)

## 2022-04-30 LAB — GAMMA GT: GGT: 16 U/L (ref 3–65)

## 2022-04-30 LAB — MITOCHONDRIAL ANTIBODIES: Mitochondrial M2 Ab, IgG: <=20 U (ref <=20.0)

## 2022-04-30 NOTE — Telephone Encounter (Signed)
Informed pt of test results. Pt voiced understanding.

## 2022-05-01 ENCOUNTER — Encounter: Payer: Self-pay | Admitting: *Deleted

## 2022-05-02 ENCOUNTER — Other Ambulatory Visit: Payer: Self-pay | Admitting: Gastroenterology

## 2022-05-02 ENCOUNTER — Ambulatory Visit (HOSPITAL_COMMUNITY)
Admission: RE | Admit: 2022-05-02 | Discharge: 2022-05-02 | Disposition: A | Discharge status: Home / Self Care | Payer: Medicare HMO | Source: Ambulatory Visit | Attending: Gastroenterology | Admitting: Gastroenterology

## 2022-05-02 DIAGNOSIS — R9389 Abnormal findings on diagnostic imaging of other specified body structures: Secondary | ICD-10-CM

## 2022-05-02 MED ORDER — GADOBUTROL 1 MMOL/ML IV SOLN
10.0000 mL | Freq: Once | INTRAVENOUS | Status: AC | PRN
  Administered 2022-05-02: 10 mL via INTRAVENOUS

## 2022-05-06 ENCOUNTER — Encounter: Payer: Self-pay | Admitting: *Deleted

## 2022-05-16 ENCOUNTER — Encounter (HOSPITAL_COMMUNITY): Payer: Self-pay | Admitting: Emergency Medicine

## 2022-05-16 ENCOUNTER — Other Ambulatory Visit: Payer: Self-pay

## 2022-05-16 ENCOUNTER — Emergency Department (HOSPITAL_COMMUNITY): Payer: Medicare HMO

## 2022-05-16 ENCOUNTER — Emergency Department (HOSPITAL_COMMUNITY)
Admission: EM | Admit: 2022-05-16 | Discharge: 2022-05-16 | Disposition: A | Discharge status: Home / Self Care | Payer: Medicare HMO | Attending: Emergency Medicine | Admitting: Emergency Medicine

## 2022-05-16 DIAGNOSIS — S0990XA Unspecified injury of head, initial encounter: Secondary | ICD-10-CM | POA: Diagnosis present

## 2022-05-16 DIAGNOSIS — S40811A Abrasion of right upper arm, initial encounter: Secondary | ICD-10-CM | POA: Insufficient documentation

## 2022-05-16 DIAGNOSIS — W109XXA Fall (on) (from) unspecified stairs and steps, initial encounter: Secondary | ICD-10-CM | POA: Insufficient documentation

## 2022-05-16 DIAGNOSIS — Z23 Encounter for immunization: Secondary | ICD-10-CM | POA: Diagnosis not present

## 2022-05-16 DIAGNOSIS — S0101XA Laceration without foreign body of scalp, initial encounter: Secondary | ICD-10-CM | POA: Insufficient documentation

## 2022-05-16 DIAGNOSIS — M542 Cervicalgia: Secondary | ICD-10-CM | POA: Diagnosis not present

## 2022-05-16 DIAGNOSIS — Z7982 Long term (current) use of aspirin: Secondary | ICD-10-CM | POA: Diagnosis not present

## 2022-05-16 MED ORDER — TETANUS-DIPHTH-ACELL PERTUSSIS 5-2.5-18.5 LF-MCG/0.5 IM SUSY
0.5000 mL | PREFILLED_SYRINGE | Freq: Once | INTRAMUSCULAR | Status: AC
  Administered 2022-05-16: 0.5 mL via INTRAMUSCULAR
  Filled 2022-05-16: qty 0.5

## 2022-05-16 MED ORDER — LIDOCAINE-EPINEPHRINE (PF) 1 %-1:200000 IJ SOLN
10.0000 mL | Freq: Once | INTRAMUSCULAR | Status: AC
  Administered 2022-05-16: 10 mL
  Filled 2022-05-16: qty 30

## 2022-05-16 NOTE — ED Notes (Signed)
Cleanse laceration to head with NS, applied ice pack to right hand

## 2022-05-16 NOTE — ED Triage Notes (Signed)
Pt fell off porch, striking head on rocks, laceration to head, currently bleeding, dressing in place, right arm, right leg.

## 2022-05-16 NOTE — Discharge Instructions (Addendum)
Return to the emergency department or your primary care doctor in 7 to 10 days for staple removal.  As we discussed, all of your x-rays and CT scans were normal.  You will be sore in the coming days.  Please ice the right hand.  Please return to the emergency department sooner if you experience any fevers or purulent drainage from the wound on your head.

## 2022-05-16 NOTE — ED Provider Notes (Signed)
[facility] EMERGENCY DEPARTMENT Provider Note   CSN: [id number] Arrival date & time: 05/16/22  1717     History Chief Complaint  Patient presents with   [name] is a 71 y.o. female patient presents to the emerged from today after mechanical trip and fall down some stairs.  She did hit her head.  Did not lose consciousness.  She sustained a laceration to the right parietal scalp.  Patient does take aspirin but no other anticoagulants.  Also complaining of associated neck pain.  No focal weakness or numbness to the upper or lower extremities.  Also complaining of some abrasions over the right arm.  Denies any other injuries.   Fall       Home Medications Prior to Admission medications   Medication Sig Start Date End Date Taking? Authorizing Provider  acetaminophen (TYLENOL) 650 MG CR tablet Take 1,300 mg by mouth 2 (two) times daily.    [provider]  aspirin 325 MG tablet Take 325 mg by mouth at bedtime.     [provider]  Coenzyme Q10 (COQ10) 200 MG CAPS Take 200 mg by mouth at bedtime.    [provider]  diclofenac Sodium (VOLTAREN) 1 % GEL Apply 1 Application topically 2 (two) times daily as needed (pain).    [provider]  Lidocaine, Anorectal, (RECTICARE EX) Apply 1 application  topically as needed (irritation).    [provider]  omeprazole (PRILOSEC) 20 MG capsule 1 PO 30 MINS PRIOR TO BREAKFAST. Patient taking differently: Take 20 mg by mouth every morning. 1 PO 30 MINS PRIOR TO BREAKFAST. 05/31/21   Mahala Menghini, PA-C  oxybutynin (DITROPAN) 5 MG tablet Take 5 mg by mouth at bedtime.    [provider]  ramipril (ALTACE) 2.5 MG capsule Take 2.5 mg by mouth daily.    [provider]  simvastatin (ZOCOR) 40 MG tablet Take 40 mg by mouth at bedtime.    [provider]      Allergies    Patient has no known allergies.    Review of Systems   Review of Systems  All other  systems reviewed and are negative.   Physical Exam Updated Vital Signs BP (!) 168/101 (BP Location: Right Arm)   Pulse 84   Temp 98 F (36.7 C) (Oral)   Resp 18   Ht '5\' 9"'$  (1.753 m)   Wt 125.2 kg   SpO2 95%   BMI 40.76 kg/m  Physical Exam Vitals and nursing note reviewed.  Constitutional:      Appearance: Normal appearance.  HENT:     Head: Normocephalic.     Comments: There is a 4 cm linear laceration to the right parietal scalp.  Bleeding is controlled. Eyes:     General:        Right eye: No discharge.        Left eye: No discharge.     Conjunctiva/sclera: Conjunctivae normal.  Pulmonary:     Effort: Pulmonary effort is normal.  Skin:    General: Skin is warm and dry.     Findings: No rash.  Neurological:     General: No focal deficit present.     Mental Status: She is alert.  Psychiatric:        Mood and Affect: Mood normal.        Behavior: Behavior normal.     ED Results / Procedures / Treatments   Labs (all labs ordered  are listed, but only abnormal results are displayed) Labs Reviewed - No data to display  EKG None  Radiology DG Hand Complete Right  Result Date: 05/16/2022 CLINICAL DATA:  Right hand pain after fall EXAM: RIGHT HAND - COMPLETE 3+ VIEW COMPARISON:  None Available. FINDINGS: No acute fracture or dislocation in the right hand. Scattered degenerative arthritis greatest about the first [facility] and third DIP joints IMPRESSION: 1. No acute fracture or dislocation. 2. Advanced degenerative arthritis first [facility] and third DIP joints. Electronically Signed   By: Placido Sou M.D.   On: 05/16/2022 19:43   DG Elbow Complete Right  Result Date: 05/16/2022 CLINICAL DATA:  Right hand and elbow pain after fall EXAM: RIGHT ELBOW - COMPLETE 3+ VIEW COMPARISON:  None Available. FINDINGS: There is no evidence of fracture, dislocation, or joint effusion. There is no evidence of arthropathy or other focal bone abnormality. Soft tissues are unremarkable.  IMPRESSION: Negative. Electronically Signed   By: Placido Sou M.D.   On: 05/16/2022 19:41   CT Head Wo Contrast  Result Date: 05/16/2022 CLINICAL DATA:  Recent fall from porch with headaches and neck pain, initial encounter EXAM: CT HEAD WITHOUT CONTRAST CT CERVICAL SPINE WITHOUT CONTRAST TECHNIQUE: Multidetector CT imaging of the head and cervical spine was performed following the standard protocol without intravenous contrast. Multiplanar CT image reconstructions of the cervical spine were also generated. RADIATION DOSE REDUCTION: This exam was performed according to the departmental dose-optimization program which includes automated exposure control, adjustment of the mA and/or kV according to patient size and/or use of iterative reconstruction technique. COMPARISON:  12/08/2018 FINDINGS: CT HEAD FINDINGS Brain: No evidence of acute infarction, hemorrhage, hydrocephalus, extra-axial collection or mass lesion/mass effect. Mild atrophic changes are noted. Small calcified meningioma is noted in the right frontal region. Vascular: No hyperdense vessel or unexpected calcification. Skull: Normal. Negative for fracture or focal lesion. Sinuses/Orbits: No acute finding. Other: None. CT CERVICAL SPINE FINDINGS Alignment: Within normal limits. Skull base and vertebrae: 7 cervical segments are well visualized. Vertebral body height is well maintained. Multilevel osteophytic changes and facet hypertrophic changes are seen particularly on the left. No acute fracture or acute facet abnormality is noted. Soft tissues and spinal canal: Surrounding soft tissue structures are within normal limits. Upper chest: Visualized lung apices are within normal limits. Other: None IMPRESSION: CT of the head: Chronic atrophic changes without acute abnormality. CT of the cervical spine: Multilevel degenerative change without acute abnormality. Electronically Signed   By: Inez Catalina M.D.   On: 05/16/2022 19:36   CT Cervical Spine Wo  Contrast  Result Date: 05/16/2022 CLINICAL DATA:  Recent fall from porch with headaches and neck pain, initial encounter EXAM: CT HEAD WITHOUT CONTRAST CT CERVICAL SPINE WITHOUT CONTRAST TECHNIQUE: Multidetector CT imaging of the head and cervical spine was performed following the standard protocol without intravenous contrast. Multiplanar CT image reconstructions of the cervical spine were also generated. RADIATION DOSE REDUCTION: This exam was performed according to the departmental dose-optimization program which includes automated exposure control, adjustment of the mA and/or kV according to patient size and/or use of iterative reconstruction technique. COMPARISON:  12/08/2018 FINDINGS: CT HEAD FINDINGS Brain: No evidence of acute infarction, hemorrhage, hydrocephalus, extra-axial collection or mass lesion/mass effect. Mild atrophic changes are noted. Small calcified meningioma is noted in the right frontal region. Vascular: No hyperdense vessel or unexpected calcification. Skull: Normal. Negative for fracture or focal lesion. Sinuses/Orbits: No acute finding. Other: None. CT CERVICAL SPINE FINDINGS Alignment: Within normal limits.  Skull base and vertebrae: 7 cervical segments are well visualized. Vertebral body height is well maintained. Multilevel osteophytic changes and facet hypertrophic changes are seen particularly on the left. No acute fracture or acute facet abnormality is noted. Soft tissues and spinal canal: Surrounding soft tissue structures are within normal limits. Upper chest: Visualized lung apices are within normal limits. Other: None IMPRESSION: CT of the head: Chronic atrophic changes without acute abnormality. CT of the cervical spine: Multilevel degenerative change without acute abnormality. Electronically Signed   By: Inez Catalina M.D.   On: 05/16/2022 19:36    Procedures .Marland KitchenLaceration Repair  Date/Time: 05/16/2022 9:12 PM  Performed by: Hendricks Limes, PA-C Authorized by:  Hendricks Limes, PA-C   Consent:    Consent obtained:  Verbal   Consent given by:  Patient   Risks, benefits, and alternatives were discussed: yes     Risks discussed:  Infection and pain Universal protocol:    Procedure explained and questions answered to patient or proxy's satisfaction: yes     Relevant documents present and verified: yes     Test results available: yes     Imaging studies available: yes     Required blood products, implants, devices, and special equipment available: no     Site/side marked: no     Immediately prior to procedure, a time out was called: no     Patient identity confirmed:  Verbally with patient and arm band Anesthesia:    Anesthesia method:  Local infiltration   Local anesthetic:  Lidocaine 1% WITH epi Laceration details:    Location:  Scalp   Scalp location:  Frontal   Length (cm):  6   Depth (mm):  5 Pre-procedure details:    Preparation:  Patient was prepped and draped in usual sterile fashion Exploration:    Limited defect created (wound extended): no     Hemostasis achieved with:  Epinephrine and direct pressure   Imaging obtained comment:  CT head   Imaging outcome: foreign body noted     Wound exploration: wound explored through full range of motion and entire depth of wound visualized     Wound extent: areolar tissue violated     Wound extent: no fascia violation noted, no foreign bodies/material noted, no muscle damage noted, no nerve damage noted, no tendon damage noted, no underlying fracture noted and no vascular damage noted   Treatment:    Area cleansed with:  Saline   Amount of cleaning:  Standard   Irrigation solution:  Sterile saline   Irrigation volume:  .5L   Irrigation method:  Syringe   Visualized foreign bodies/material removed: no     Debridement:  None   Undermining:  None   Scar revision: no   Skin repair:    Repair method:  Staples   Number of staples:  5     Medications Ordered in ED Medications  Tdap  (BOOSTRIX) injection 0.5 mL (has no administration in time range)  lidocaine-EPINEPHrine (PF) (XYLOCAINE-EPINEPHrine) 1 %-1:200000 (PF) injection 10 mL (10 mLs Infiltration Given 05/16/22 2109)    ED Course/ Medical Decision Making/ A&P Clinical Course as of 05/16/22 2116  Fri May 16, 2022  2115 CT Cervical Spine Wo Contrast I personally ordered and interpreted this study and it was normal.  I do agree with the radiologist interpretation. [CF]  2115 CT Head Wo Contrast I personally ordered and interpreted this study.  I do agree with the radiologist interpretation.  No evidence of intracranial  hemorrhage. [CF]  2115 DG Elbow Complete Right I personally ordered and interpreted this study.  No evidence of fracture.  I do agree with the radiologist interpretation. [CF]  2115 DG Hand Complete Right I personally ordered and interpreted this study.  No evidence of fracture or dislocation.  I do agree with the radiologist interpretation. [CF]    Clinical Course User Index [CF] Hendricks Limes, PA-C                           Medical Decision Making OMOLARA CAROL is a 71 y.o. female patient who presents to the emergency department today for further evaluation of a fall and subsequent scalp laceration.  Patient tripped and fell.  Patient acting normally.  Have a low suspicion for any intracranial pathology.  Patient not on blood thinners.  Nonetheless, we will get imaging of the head and neck in addition to right elbow and right hand.  I will plan to reassess.  Patient still doing well.  I repaired the scalp laceration with staples.  Please see procedure note above.  She tolerated procedure well.  I told her she needs to return to the emergency department or her primary care doctor in 7-10 days for staple removal.  She is safe for discharge at this time.  Strict return precautions discussed.   Amount and/or Complexity of Data Reviewed Radiology: ordered.  Risk Prescription drug  management.   Final Clinical Impression(s) / ED Diagnoses Final diagnoses:  Laceration of scalp, initial encounter    Rx / DC Orders ED Discharge Orders     None         Hendricks Limes, Hershal Coria 05/16/22 2119    Ottie Glazier, DO 05/17/22 0020

## 2022-05-16 NOTE — Patient Instructions (Signed)
[name]  05/16/2022     '@PREFPERIOPPHARMACY'$ @   Your procedure is scheduled on  05/22/2022.   Report to [facility] at  [email or web address]   Call this number if you have problems the morning of surgery:  [phone]   Remember:  Follow the diet instructions given to you by the office.    Take these medicines the morning of surgery with A SIP OF WATER                                       Prilosec.     Do not wear jewelry, make-up or nail polish.  Do not wear lotions, powders, or perfumes, or deodorant.  Do not shave 48 hours prior to surgery.  Men may shave face and neck.  Do not bring valuables to the hospital.  [facility] is not responsible for any belongings or valuables.  Contacts, dentures or bridgework may not be worn into surgery.  Leave your suitcase in the car.  After surgery it may be brought to your room.  For patients admitted to the hospital, discharge time will be determined by your treatment team.  Patients discharged the day of surgery will not be allowed to drive home and must have someone with them for 24 hours.    Special instructions:   DO NOT smoke tobacco or vape for 24 hours before your procedure.  Please read over the following fact sheets that you were given. Anesthesia Post-op Instructions and Care and Recovery After Surgery      Upper Endoscopy, Adult, Care After After the procedure, it is common to have a sore throat. It is also common to have: Mild stomach pain or discomfort. Bloating. Nausea. Follow these instructions at home: The instructions below may help you care for yourself at home. Your health care provider may give you more instructions. If you have questions, ask your health care provider. If you were given a sedative during the procedure, it can affect you for several hours. Do not drive or operate machinery until your health care provider says that it is safe. If you will be going home right after the procedure, plan to  have a responsible adult: Take you home from the hospital or clinic. You will not be allowed to drive. Care for you for the time you are told. Follow instructions from your health care provider about what you may eat and drink. Return to your normal activities as told by your health care provider. Ask your health care provider what activities are safe for you. Take over-the-counter and prescription medicines only as told by your health care provider. Contact a health care provider if you: Have a sore throat that lasts longer than one day. Have trouble swallowing. Have a fever. Get help right away if you: Vomit blood or your vomit looks like coffee grounds. Have bloody, black, or tarry stools. Have a very bad sore throat or you cannot swallow. Have difficulty breathing or very bad pain in your chest or abdomen. These symptoms may be an emergency. Get help right away. Call 911. Do not wait to see if the symptoms will go away. Do not drive yourself to the hospital. Summary After the procedure, it is common to have a sore throat, mild stomach discomfort, bloating, and nausea. If you were given a sedative during the procedure, it can affect you for  several hours. Do not drive until your health care provider says that it is safe. Follow instructions from your health care provider about what you may eat and drink. Return to your normal activities as told by your health care provider. This information is not intended to replace advice given to you by your health care provider. Make sure you discuss any questions you have with your health care provider. Document Revised: 01/01/2022 Document Reviewed: 01/01/2022 Elsevier Patient Education  [place] After This sheet gives you information about how to care for yourself after your procedure. Your health care provider may also give you more specific instructions. If you have problems or questions, contact your  health care provider. What can I expect after the procedure? After the procedure, it is common to have: Tiredness. Forgetfulness about what happened after the procedure. Impaired judgment for important decisions. Nausea or vomiting. Some difficulty with balance. Follow these instructions at home: For the time period you were told by your health care provider:     Rest as needed. Do not participate in activities where you could fall or become injured. Do not drive or use machinery. Do not drink alcohol. Do not take sleeping pills or medicines that cause drowsiness. Do not make important decisions or sign legal documents. Do not take care of children on your own. Eating and drinking Follow the diet that is recommended by your health care provider. Drink enough fluid to keep your urine pale yellow. If you vomit: Drink water, juice, or soup when you can drink without vomiting. Make sure you have little or no nausea before eating solid foods. General instructions Have a responsible adult stay with you for the time you are told. It is important to have someone help care for you until you are awake and alert. Take over-the-counter and prescription medicines only as told by your health care provider. If you have sleep apnea, surgery and certain medicines can increase your risk for breathing problems. Follow instructions from your health care provider about wearing your sleep device: Anytime you are sleeping, including during daytime naps. While taking prescription pain medicines, sleeping medicines, or medicines that make you drowsy. Avoid smoking. Keep all follow-up visits as told by your health care provider. This is important. Contact a health care provider if: You keep feeling nauseous or you keep vomiting. You feel light-headed. You are still sleepy or having trouble with balance after 24 hours. You develop a rash. You have a fever. You have redness or swelling around the IV  site. Get help right away if: You have trouble breathing. You have new-onset confusion at home. Summary For several hours after your procedure, you may feel tired. You may also be forgetful and have poor judgment. Have a responsible adult stay with you for the time you are told. It is important to have someone help care for you until you are awake and alert. Rest as told. Do not drive or operate machinery. Do not drink alcohol or take sleeping pills. Get help right away if you have trouble breathing, or if you suddenly become confused. This information is not intended to replace advice given to you by your health care provider. Make sure you discuss any questions you have with your health care provider. Document Revised: 08/27/2021 Document Reviewed: 08/25/2019 Elsevier Patient Education  [place].

## 2022-05-20 ENCOUNTER — Encounter (HOSPITAL_COMMUNITY)
Admission: RE | Admit: 2022-05-20 | Discharge: 2022-05-20 | Disposition: A | Discharge status: Home / Self Care | Payer: Medicare HMO | Source: Ambulatory Visit | Attending: Internal Medicine | Admitting: Internal Medicine

## 2022-05-20 VITALS — BP 157/80 | HR 66 | Temp 98.0°F | Resp 18 | Ht 69.0 in | Wt 276.0 lb

## 2022-05-20 DIAGNOSIS — I10 Essential (primary) hypertension: Secondary | ICD-10-CM

## 2022-05-20 NOTE — Progress Notes (Signed)
Dr Charna Elizabeth reviewed chart regarding fall 05/12/2022.  OK to proceed with EGD. No orders given

## 2022-05-22 ENCOUNTER — Ambulatory Visit (HOSPITAL_COMMUNITY)
Admission: RE | Admit: 2022-05-22 | Discharge: 2022-05-22 | Disposition: A | Discharge status: Home / Self Care | Payer: Medicare HMO | Attending: Internal Medicine | Admitting: Internal Medicine

## 2022-05-22 ENCOUNTER — Encounter (HOSPITAL_COMMUNITY)
Admission: RE | Disposition: A | Discharge status: Home / Self Care | Payer: Self-pay | Source: Home / Self Care | Attending: Internal Medicine

## 2022-05-22 ENCOUNTER — Ambulatory Visit (HOSPITAL_BASED_OUTPATIENT_CLINIC_OR_DEPARTMENT_OTHER): Payer: Medicare HMO | Admitting: Anesthesiology

## 2022-05-22 ENCOUNTER — Encounter (HOSPITAL_COMMUNITY): Payer: Self-pay

## 2022-05-22 ENCOUNTER — Ambulatory Visit (HOSPITAL_COMMUNITY): Payer: Medicare HMO | Admitting: Anesthesiology

## 2022-05-22 DIAGNOSIS — Z6841 Body Mass Index (BMI) 40.0 and over, adult: Secondary | ICD-10-CM | POA: Insufficient documentation

## 2022-05-22 DIAGNOSIS — K760 Fatty (change of) liver, not elsewhere classified: Secondary | ICD-10-CM | POA: Insufficient documentation

## 2022-05-22 DIAGNOSIS — Z79899 Other long term (current) drug therapy: Secondary | ICD-10-CM | POA: Insufficient documentation

## 2022-05-22 DIAGNOSIS — K219 Gastro-esophageal reflux disease without esophagitis: Secondary | ICD-10-CM | POA: Diagnosis not present

## 2022-05-22 DIAGNOSIS — I10 Essential (primary) hypertension: Secondary | ICD-10-CM

## 2022-05-22 DIAGNOSIS — Z86010 Personal history of colonic polyps: Secondary | ICD-10-CM | POA: Insufficient documentation

## 2022-05-22 DIAGNOSIS — Z8673 Personal history of transient ischemic attack (TIA), and cerebral infarction without residual deficits: Secondary | ICD-10-CM | POA: Insufficient documentation

## 2022-05-22 DIAGNOSIS — R1013 Epigastric pain: Secondary | ICD-10-CM

## 2022-05-22 DIAGNOSIS — K2970 Gastritis, unspecified, without bleeding: Secondary | ICD-10-CM

## 2022-05-22 HISTORY — PX: ESOPHAGOGASTRODUODENOSCOPY (EGD) WITH PROPOFOL: SHX5813

## 2022-05-22 HISTORY — PX: BIOPSY: SHX5522

## 2022-05-22 SURGERY — ESOPHAGOGASTRODUODENOSCOPY (EGD) WITH PROPOFOL
Anesthesia: General

## 2022-05-22 MED ORDER — PANTOPRAZOLE SODIUM 40 MG PO TBEC: 40.0000 mg | DELAYED_RELEASE_TABLET | Freq: Two times a day (BID) | ORAL | 11 refills | Status: DC

## 2022-05-22 MED ORDER — PROPOFOL 10 MG/ML IV BOLUS
INTRAVENOUS | Status: DC | PRN
  Administered 2022-05-22: 80 mg via INTRAVENOUS
  Administered 2022-05-22 (×3): 40 mg via INTRAVENOUS

## 2022-05-22 MED ORDER — PROPOFOL 500 MG/50ML IV EMUL
INTRAVENOUS | Status: AC
  Filled 2022-05-22: qty 50

## 2022-05-22 MED ORDER — LACTATED RINGERS IV SOLN
INTRAVENOUS | Status: DC
  Administered 2022-05-22: 1000 mL via INTRAVENOUS

## 2022-05-22 NOTE — Interval H&P Note (Signed)
History and Physical Interval Note:  05/22/2022 10:29 AM  [name]  has presented today for surgery, with the diagnosis of epigastric pain.  The various methods of treatment have been discussed with the patient and family. After consideration of risks, benefits and other options for treatment, the patient has consented to  Procedure(s) with comments: ESOPHAGOGASTRODUODENOSCOPY (EGD) WITH PROPOFOL (N/A) - 11:15 am as a surgical intervention.  The patient's history has been reviewed, patient examined, no change in status, stable for surgery.  I have reviewed the patient's chart and labs.  Questions were answered to the patient's satisfaction.     Eloise Harman

## 2022-05-22 NOTE — Transfer of Care (Signed)
Immediate Anesthesia Transfer of Care Note  Patient: [name]  Procedure(s) Performed: ESOPHAGOGASTRODUODENOSCOPY (EGD) WITH PROPOFOL BIOPSY  Patient Location: PACU  Anesthesia Type:General  Level of Consciousness: awake, alert  and oriented  Airway & Oxygen Therapy: Patient Spontanous Breathing  Post-op Assessment: Report given to RN, Post -op Vital signs reviewed and stable and Patient moving all extremities X 4  Post vital signs: Reviewed  Last Vitals:  Vitals Value Taken Time  BP 138/76 05/22/22 1051  Temp 36.6 C 05/22/22 1051  Pulse 83 05/22/22 1051  Resp 24 05/22/22 1051  SpO2 92 % 05/22/22 1051    Last Pain:  Vitals:   05/22/22 1051  TempSrc: Axillary  PainSc: 0-No pain      Patients Stated Pain Goal: 8 (54/65/03 5465)  Complications: No notable events documented.

## 2022-05-22 NOTE — Anesthesia Preprocedure Evaluation (Signed)
Anesthesia Evaluation  Patient identified by MRN, date of birth, ID band Patient awake    Reviewed: Allergy & Precautions, NPO status , Patient's Chart, lab work & pertinent test results  Airway Mallampati: II  TM Distance: >3 FB Neck ROM: Full    Dental  (+) Dental Advisory Given, Caps   Pulmonary neg pulmonary ROS,    Pulmonary exam normal breath sounds clear to auscultation       Cardiovascular Exercise Tolerance: Good hypertension, Pt. on medications + DOE  Normal cardiovascular exam Rhythm:Regular Rate:Normal     Neuro/Psych TIAnegative psych ROS   GI/Hepatic Neg liver ROS, GERD  Medicated and Controlled,  Endo/Other  Morbid obesity  Renal/GU negative Renal ROS  negative genitourinary   Musculoskeletal negative musculoskeletal ROS (+)   Abdominal   Peds negative pediatric ROS (+)  Hematology negative hematology ROS (+)   Anesthesia Other Findings   Reproductive/Obstetrics negative OB ROS                             Anesthesia Physical Anesthesia Plan  ASA: 3  Anesthesia Plan: General   Post-op Pain Management: Minimal or no pain anticipated   Induction: Intravenous  PONV Risk Score and Plan: Propofol infusion  Airway Management Planned: Nasal Cannula and Natural Airway  Additional Equipment:   Intra-op Plan:   Post-operative Plan:   Informed Consent: I have reviewed the patients History and Physical, chart, labs and discussed the procedure including the risks, benefits and alternatives for the proposed anesthesia with the patient or authorized representative who has indicated his/her understanding and acceptance.     Dental advisory given  Plan Discussed with: CRNA and Surgeon  Anesthesia Plan Comments:         Anesthesia Quick Evaluation

## 2022-05-22 NOTE — Anesthesia Postprocedure Evaluation (Signed)
Anesthesia Post Note  Patient: [name]  Procedure(s) Performed: ESOPHAGOGASTRODUODENOSCOPY (EGD) WITH PROPOFOL BIOPSY  Patient location during evaluation: Phase II Anesthesia Type: General Level of consciousness: awake and alert and oriented Pain management: pain level controlled Vital Signs Assessment: post-procedure vital signs reviewed and stable Respiratory status: spontaneous breathing, nonlabored ventilation and respiratory function stable Cardiovascular status: blood pressure returned to baseline and stable Postop Assessment: no apparent nausea or vomiting Anesthetic complications: no   No notable events documented.   Last Vitals:  Vitals:   05/22/22 1004 05/22/22 1051  BP:  138/76  Pulse:  83  Resp: 19 (!) 24  Temp: 36.7 C 36.6 C  SpO2: 97% 92%    Last Pain:  Vitals:   05/22/22 1051  TempSrc: Axillary  PainSc: 0-No pain                 Jacquelyn Antony C Makinze Jani

## 2022-05-22 NOTE — Op Note (Signed)
[facility] Patient Name: [name] Procedure Date: 05/22/2022 10:28 AM MRN: [id number] Date of Birth: 07/11/1951 Attending MD: Elon Alas. Abbey Chatters DO CSN: [id number] Age: 71 Admit Type: Outpatient Procedure:                Upper GI endoscopy Indications:              Epigastric abdominal pain Providers:                Elon Alas. Abbey Chatters, DO, Charlsie Quest. Theda Sers RN, RN,                            Rosina Lowenstein, RN, Ladoris Gene Technician, [employer] officer Referring MD:              Medicines:                See the Anesthesia note for documentation of the                            administered medications Complications:            No immediate complications. Estimated Blood Loss:     Estimated blood loss was minimal. Procedure:                Pre-Anesthesia Assessment:                           - The anesthesia plan was to use monitored                            anesthesia care (MAC).                           After obtaining informed consent, the endoscope was                            passed under direct vision. Throughout the                            procedure, the patient's blood pressure, pulse, and                            oxygen saturations were monitored continuously. The                            GIF-H190 ([id number]) scope was introduced through the                            mouth, and advanced to the second part of duodenum.                            The upper GI endoscopy was accomplished without                            difficulty. The patient tolerated the procedure  well. Scope In: 10:40:43 AM Scope Out: 10:44:54 AM Total Procedure Duration: 0 hours 4 minutes 11 seconds  Findings:      The Z-line was regular and was found 39 cm from the incisors.      There is no endoscopic evidence of areas of erosion, esophagitis,       ulcerations or varices in the entire esophagus.      Patchy mild inflammation characterized by erythema was  found in the       gastric body. Biopsies were taken with a cold forceps for Helicobacter       pylori testing.      The duodenal bulb, first portion of the duodenum and second portion of       the duodenum were normal. Impression:               - Z-line regular, 39 cm from the incisors.                           - Gastritis. Biopsied.                           - Normal duodenal bulb, first portion of the                            duodenum and second portion of the duodenum. Moderate Sedation:      Per Anesthesia Care Recommendation:           - Patient has a contact number available for                            emergencies. The signs and symptoms of potential                            delayed complications were discussed with the                            patient. Return to normal activities tomorrow.                            Written discharge instructions were provided to the                            patient.                           - Resume previous diet.                           - Continue present medications.                           - Await pathology results.                           - Use Prilosec (omeprazole) 40 mg PO BID for 12                            weeks.                           -  No ibuprofen, naproxen, or other non-steroidal                            anti-inflammatory drugs.                           - Return to GI clinic in 3 months. Procedure Code(s):        --- Professional ---                           [phone], Esophagogastroduodenoscopy, flexible,                            transoral; with biopsy, single or multiple Diagnosis Code(s):        --- Professional ---                           K29.70, Gastritis, unspecified, without bleeding                           R10.13, Epigastric pain CPT copyright 2019 American Medical Association. All rights reserved. The codes documented in this report are preliminary and upon coder review may  be revised to  meet current compliance requirements. Elon Alas. Abbey Chatters, DO [place] Abbey Chatters, DO 05/22/2022 10:47:40 AM This report has been signed electronically. Number of Addenda: 0

## 2022-05-22 NOTE — Discharge Instructions (Addendum)
EGD Discharge instructions Please read the instructions outlined below and refer to this sheet in the next few weeks. These discharge instructions provide you with general information on caring for yourself after you leave the hospital. Your doctor may also give you specific instructions. While your treatment has been planned according to the most current medical practices available, unavoidable complications occasionally occur. If you have any problems or questions after discharge, please call your doctor. ACTIVITY You may resume your regular activity but move at a slower pace for the next 24 hours.  Take frequent rest periods for the next 24 hours.  Walking will help expel (get rid of) the air and reduce the bloated feeling in your abdomen.  No driving for 24 hours (because of the anesthesia (medicine) used during the test).  You may shower.  Do not sign any important legal documents or operate any machinery for 24 hours (because of the anesthesia used during the test).  NUTRITION Drink plenty of fluids.  You may resume your normal diet.  Begin with a light meal and progress to your normal diet.  Avoid alcoholic beverages for 24 hours or as instructed by your caregiver.  MEDICATIONS You may resume your normal medications unless your caregiver tells you otherwise.  WHAT YOU CAN EXPECT TODAY You may experience abdominal discomfort such as a feeling of fullness or "gas" pains.  FOLLOW-UP Your doctor will discuss the results of your test with you.  SEEK IMMEDIATE MEDICAL ATTENTION IF ANY OF THE FOLLOWING OCCUR: Excessive nausea (feeling sick to your stomach) and/or vomiting.  Severe abdominal pain and distention (swelling).  Trouble swallowing.  Temperature over 101 F (37.8 C).  Rectal bleeding or vomiting of blood.   Your EGD revealed mild amount inflammation in your stomach.  I took biopsies of this to rule out infection with a bacteria called H. pylori.  Await pathology results, my  office will contact you.  Your esophagus and small bowel appeared normal.  I am going to change your acid medication to pantoprazole 40 mg twice daily for the next 12 weeks.  After 12 weeks to go down to once daily.  Avoid NSAIDs as best she can.  Follow-up with GI in 3 to 4 months.    I hope you have a great rest of your week!  Elon Alas. Abbey Chatters, D.O. Gastroenterology and Hepatology [facility]

## 2022-05-23 LAB — SURGICAL PATHOLOGY

## 2022-05-26 ENCOUNTER — Encounter (HOSPITAL_COMMUNITY): Payer: Self-pay | Admitting: Internal Medicine

## 2022-05-27 ENCOUNTER — Other Ambulatory Visit: Payer: Self-pay

## 2022-05-27 ENCOUNTER — Encounter: Payer: Self-pay | Admitting: Emergency Medicine

## 2022-05-27 ENCOUNTER — Ambulatory Visit
Admission: EM | Admit: 2022-05-27 | Discharge: 2022-05-27 | Disposition: A | Discharge status: Home / Self Care | Payer: Medicare HMO | Attending: Family Medicine | Admitting: Family Medicine

## 2022-05-27 DIAGNOSIS — N390 Urinary tract infection, site not specified: Secondary | ICD-10-CM | POA: Diagnosis present

## 2022-05-27 HISTORY — DX: Gastritis, unspecified, without bleeding: K29.70

## 2022-05-27 LAB — POCT URINALYSIS DIP (MANUAL ENTRY)
Bilirubin, UA: NEGATIVE
Glucose, UA: NEGATIVE mg/dL
Ketones, POC UA: NEGATIVE mg/dL
Nitrite, UA: NEGATIVE
Protein Ur, POC: 30 mg/dL — AB
Spec Grav, UA: 1.02 (ref 1.010–1.025)
Urobilinogen, UA: 0.2 E.U./dL
pH, UA: 6 (ref 5.0–8.0)

## 2022-05-27 MED ORDER — CEPHALEXIN 500 MG PO CAPS: 500.0000 mg | ORAL_CAPSULE | Freq: Two times a day (BID) | ORAL | 0 refills | Status: DC

## 2022-05-27 NOTE — ED Provider Notes (Signed)
[facility]    CSN: [id number] Arrival date & time: 05/27/22  0807      History   Chief Complaint Chief Complaint  Patient presents with   Urinary Frequency    HPI [name] is a 71 y.o. female.   Presenting today with several day history of dysuria, urinary frequency.  Denies fever, nausea, vomiting, hematuria, bowel changes, vaginal symptoms.  So far not trying anything over-the-counter for symptoms.  Does endorse not drinking much water.  History of urinary tract infections that felt similar.    Past Medical History:  Diagnosis Date   Gastritis    GERD (gastroesophageal reflux disease)    HTN (hypertension)    Hyperlipidemia    TIA (transient ischemic attack) 2004   no residual    Patient Active Problem List   Diagnosis Date Noted   RUQ pain 01/27/2022   Bloating 01/27/2022   Poor appetite 01/27/2022   Abdominal wall defect, acquired 01/27/2022   Rectal itching 08/24/2020   Colon cancer screening 08/24/2020   Diarrhea 02/22/2020   Abdominal pain, epigastric 07/29/2019   Elevated alkaline phosphatase level 07/29/2019   Acute cholecystitis 02/27/2016   DOE (dyspnea on exertion) 12/12/2013   Stiffness of joint, lower leg 09/10/2011   Bilateral leg weakness 09/10/2011   S/P TKR (total knee replacement) 08/10/2011   Hypertension 08/10/2011   Dyslipidemia 08/10/2011   Gastroesophageal reflux disease 08/10/2011   UTI (lower urinary tract infection) 08/10/2011   OTHER DYSPHAGIA 05/21/2010   CEREBROVASCULAR ACCIDENT, HX OF 05/17/2010    Past Surgical History:  Procedure Laterality Date   BIOPSY  10/28/2019   Procedure: BIOPSY;  Surgeon: Danie Binder, MD;  [facility];  Service: Endoscopy;;  gastric    BIOPSY  05/22/2022   Procedure: BIOPSY;  Surgeon: Eloise Harman, DO;  [facility];  Service: Endoscopy;;   BREAST REDUCTION SURGERY     CHOLECYSTECTOMY N/A 02/29/2016   Procedure: LAPAROSCOPIC CHOLECYSTECTOMY;   Surgeon: Aviva Signs, MD;  Location: [facility];  Service: General;  Laterality: N/A;   COLONOSCOPY  04/13/2006   QIH:[id number] colon without evidence of polyps, masses, inflammatory changes diverticular or vascular ectasia/Normal retroflex view of the rectum   COLONOSCOPY WITH PROPOFOL N/A 10/09/2020   Surgeon: Eloise Harman, DO; nonbleeding internal hemorrhoids, diverticulosis in the sigmoid and descending colon, 6 mm tubular adenoma.  Recommended repeat in 5 years.   ENDOSCOPIC RETROGRADE CHOLANGIOPANCREATOGRAPHY (ERCP) WITH PROPOFOL N/A 06/14/2020   Procedure: ENDOSCOPIC RETROGRADE CHOLANGIOPANCREATOGRAPHY (ERCP) WITH PROPOFOL;  Surgeon: Milus Banister, MD;  [facility];  Service: Endoscopy;  Laterality: N/A;   ESOPHAGOGASTRODUODENOSCOPY  2011   peptic stricture s/p dialtion, miuld gastritis likely NSAID related. No H. pylori   ESOPHAGOGASTRODUODENOSCOPY N/A 10/28/2019   Procedure: ESOPHAGOGASTRODUODENOSCOPY (EGD);  Surgeon: Danie Binder, MD; Normal-appearing esophagus s/p empiric dilation, single gastric polyp resected and retrieved, mild gastritis s/p biopsy.  Pathology with fundic gland polyp.   ESOPHAGOGASTRODUODENOSCOPY (EGD) WITH PROPOFOL N/A 05/22/2022   Procedure: ESOPHAGOGASTRODUODENOSCOPY (EGD) WITH PROPOFOL;  Surgeon: Eloise Harman, DO;  [facility];  Service: Endoscopy;  Laterality: N/A;  11:15 am   JOINT REPLACEMENT     POLYPECTOMY  10/09/2020   Procedure: POLYPECTOMY;  Surgeon: Eloise Harman, DO;  [facility];  Service: Endoscopy;;   REPLACEMENT TOTAL KNEE Bilateral    SAVORY DILATION N/A 10/28/2019   Procedure: SAVORY DILATION;  Surgeon: Danie Binder, MD;  [facility];  Service:  Endoscopy;  Laterality: N/A;   SHOULDER ARTHROSCOPY WITH ROTATOR CUFF REPAIR AND SUBACROMIAL DECOMPRESSION Left 05/05/2019   Procedure: Left Shoulder Arthroscopy and Rotator Cuff Repair ACROMIOPLASTY;  Surgeon: Melrose Nakayama, MD;  Location:  [facility];  Service: Orthopedics;  Laterality: Left;   SPHINCTEROTOMY  06/14/2020   Procedure: SPHINCTEROTOMY;  Surgeon: Milus Banister, MD;  Location: [facility];  Service: Endoscopy;;  balloon sweep    OB History   No obstetric history on file.      Home Medications    Prior to Admission medications   Medication Sig Start Date End Date Taking? Authorizing Provider  acetaminophen (TYLENOL) 650 MG CR tablet Take 1,300 mg by mouth 2 (two) times daily.   Yes [provider]  aspirin 325 MG tablet Take 325 mg by mouth at bedtime.    Yes [provider]  cephALEXin (KEFLEX) 500 MG capsule Take 1 capsule (500 mg total) by mouth 2 (two) times daily. 05/27/22  Yes Volney American, PA-C  Coenzyme Q10 (COQ10) 200 MG CAPS Take 200 mg by mouth at bedtime.   Yes [provider]  diclofenac Sodium (VOLTAREN) 1 % GEL Apply 1 Application topically 2 (two) times daily as needed (pain).   Yes [provider]  Lidocaine, Anorectal, (RECTICARE EX) Apply 1 application  topically as needed (irritation).   Yes [provider]  oxybutynin (DITROPAN) 5 MG tablet Take 5 mg by mouth at bedtime.   Yes [provider]  pantoprazole (PROTONIX) 40 MG tablet Take 1 tablet (40 mg total) by mouth 2 (two) times daily before a meal. 05/22/22 05/22/23 Yes Carver, Charles K, DO  ramipril (ALTACE) 2.5 MG capsule Take 2.5 mg by mouth daily.   Yes [provider]  simvastatin (ZOCOR) 40 MG tablet Take 40 mg by mouth at bedtime.   Yes [provider]    Family History Family History  Problem Relation Age of Onset   Congestive Heart Failure Mother    Diabetes Mother    Hypertension Mother    Diabetes Father    Osteoarthritis Father    Colon cancer Neg Hx     Social History Social History   Tobacco Use   Smoking status: Never   Smokeless tobacco: Never  Vaping Use   Vaping Use: Never used  Substance Use Topics    Alcohol use: No   Drug use: No     Allergies   Patient has no known allergies.   Review of Systems Review of Systems Per HPI  Physical Exam Triage Vital Signs ED Triage Vitals [05/27/22 0826]  Enc Vitals Group     BP (!) 144/87     Pulse Rate 63     Resp 20     Temp 98.1 F (36.7 C)     Temp Source Oral     SpO2 94 %     Weight      Height      Head Circumference      Peak Flow      Pain Score 0     Pain Loc      Pain Edu?      Excl. in [place]?    No data found.  Updated Vital Signs BP (!) 144/87 (BP Location: Right Arm)   Pulse 63   Temp 98.1 F (36.7 C) (Oral)   Resp 20   SpO2 94%   Visual Acuity Right Eye Distance:   Left Eye Distance:   Bilateral Distance:  Right Eye Near:   Left Eye Near:    Bilateral Near:     Physical Exam Vitals and nursing note reviewed.  Constitutional:      Appearance: Normal appearance. She is not ill-appearing.  HENT:     Head: Atraumatic.  Eyes:     Extraocular Movements: Extraocular movements intact.     Conjunctiva/sclera: Conjunctivae normal.  Cardiovascular:     Rate and Rhythm: Normal rate and regular rhythm.     Heart sounds: Normal heart sounds.  Pulmonary:     Effort: Pulmonary effort is normal.     Breath sounds: Normal breath sounds.  Abdominal:     General: Bowel sounds are normal. There is no distension.     Palpations: Abdomen is soft.     Tenderness: There is no abdominal tenderness. There is no right CVA tenderness, left CVA tenderness or guarding.  Musculoskeletal:        General: Normal range of motion.     Cervical back: Normal range of motion and neck supple.  Skin:    General: Skin is warm and dry.  Neurological:     Mental Status: She is alert and oriented to person, place, and time.  Psychiatric:        Mood and Affect: Mood normal.        Thought Content: Thought content normal.        Judgment: Judgment normal.      UC Treatments / Results  Labs (all labs ordered are listed,  but only abnormal results are displayed) Labs Reviewed  POCT URINALYSIS DIP (MANUAL ENTRY) - Abnormal; Notable for the following components:      Result Value   Clarity, UA cloudy (*)    Blood, UA small (*)    Protein Ur, POC =30 (*)    Leukocytes, UA Large (3+) (*)    All other components within normal limits  URINE CULTURE    EKG   Radiology No results found.  Procedures Procedures (including critical care time)  Medications Ordered in UC Medications - No data to display  Initial Impression / Assessment and Plan / UC Course  I have reviewed the triage vital signs and the nursing notes.  Pertinent labs & imaging results that were available during my care of the patient were reviewed by me and considered in my medical decision making (see chart for details).     Urinalysis showing evidence of urinary tract infection.  Urine culture pending, treat with Keflex, fluids and await urine culture.  Adjust if needed.  Final Clinical Impressions(s) / UC Diagnoses   Final diagnoses:  Acute lower UTI   Discharge Instructions   None    ED Prescriptions     Medication Sig Dispense Auth. Provider   cephALEXin (KEFLEX) 500 MG capsule Take 1 capsule (500 mg total) by mouth 2 (two) times daily. 14 capsule Volney American, [place]      PDMP not reviewed this encounter.   Merrie Roof [place], [place] 05/27/22 [phone]

## 2022-05-27 NOTE — ED Triage Notes (Signed)
Pt reports intermittent chills, urinary frequency/urgency, intermittent dysuria for last several days. Reports symptoms have improved but reports general malaise.

## 2022-05-28 LAB — URINE CULTURE

## 2022-07-20 ENCOUNTER — Ambulatory Visit
Admission: RE | Admit: 2022-07-20 | Discharge: 2022-07-20 | Disposition: A | Discharge status: Home / Self Care | Payer: Medicare HMO | Source: Ambulatory Visit | Attending: Emergency Medicine | Admitting: Emergency Medicine

## 2022-07-20 VITALS — BP 169/97 | HR 71 | Temp 97.9°F | Resp 18

## 2022-07-20 DIAGNOSIS — R319 Hematuria, unspecified: Secondary | ICD-10-CM | POA: Diagnosis present

## 2022-07-20 DIAGNOSIS — N390 Urinary tract infection, site not specified: Secondary | ICD-10-CM | POA: Diagnosis not present

## 2022-07-20 LAB — POCT URINALYSIS DIP (MANUAL ENTRY)
Bilirubin, UA: NEGATIVE
Glucose, UA: NEGATIVE mg/dL
Ketones, POC UA: NEGATIVE mg/dL
Nitrite, UA: NEGATIVE
Protein Ur, POC: 30 mg/dL — AB
Spec Grav, UA: 1.025 (ref 1.010–1.025)
Urobilinogen, UA: 0.2 E.U./dL
pH, UA: 5.5 (ref 5.0–8.0)

## 2022-07-20 MED ORDER — CEPHALEXIN 500 MG PO CAPS: 500.0000 mg | ORAL_CAPSULE | Freq: Two times a day (BID) | ORAL | 0 refills | Status: DC

## 2022-07-20 MED ORDER — PHENAZOPYRIDINE HCL 200 MG PO TABS: 200.0000 mg | ORAL_TABLET | Freq: Three times a day (TID) | ORAL | 0 refills | Status: DC | PRN

## 2022-07-20 NOTE — Discharge Instructions (Addendum)
I have sent your urine off for culture to make sure that we have you on the correct antibiotic.  Continue drinking plenty of extra fluids.  Go to the ER if you start having abdominal, new or different back pain, fevers above 100.4, nausea, vomiting, or for any other concerns.

## 2022-07-20 NOTE — ED Triage Notes (Signed)
Pt reports burning when urinating and increase urinary frequency since this morning.

## 2022-07-20 NOTE — ED Provider Notes (Signed)
HPI  SUBJECTIVE:  [name] is a 71 y.o. female who presents with dysuria, urgency, frequency starting this morning.  No cloudy or odorous urine, hematuria, nausea, vomiting, fevers, abdominal, back, pelvic pain, vaginal odor, bleeding, discharge, rash, itching.  She has not been sexually active in 5 years.  No antibiotics in the past month.  She took Tylenol within 6 hours of evaluation, but took this for another reason.  She has not taken anything for her symptoms.  There are no aggravating or alleviating factors.  She has a past medical history of TIA, gastritis, hypertension.  No history of diabetes, chronic kidney disease, BV or yeast.  PCP: Dr. Willey Blade.  Patient was seen here 05/27/22 for similar symptoms, thought to have UTI, sent home on Keflex. urine culture grew out multiple species.  Past Medical History:  Diagnosis Date   Gastritis    GERD (gastroesophageal reflux disease)    HTN (hypertension)    Hyperlipidemia    TIA (transient ischemic attack) 2004   no residual    Past Surgical History:  Procedure Laterality Date   BIOPSY  10/28/2019   Procedure: BIOPSY;  Surgeon: Danie Binder, MD;  [facility];  Service: Endoscopy;;  gastric    BIOPSY  05/22/2022   Procedure: BIOPSY;  Surgeon: Eloise Harman, DO;  [facility];  Service: Endoscopy;;   BREAST REDUCTION SURGERY     CHOLECYSTECTOMY N/A 02/29/2016   Procedure: LAPAROSCOPIC CHOLECYSTECTOMY;  Surgeon: Aviva Signs, MD;  [facility]: [facility];  Service: General;  Laterality: N/A;   COLONOSCOPY  04/13/2006   HQP:RFFMBW colon without evidence of polyps, masses, inflammatory changes diverticular or vascular ectasia/Normal retroflex view of the rectum   COLONOSCOPY WITH PROPOFOL N/A 10/09/2020   Surgeon: Eloise Harman, DO; nonbleeding internal hemorrhoids, diverticulosis in the sigmoid and descending colon, 6 mm tubular adenoma.  Recommended repeat in 5 years.   ENDOSCOPIC RETROGRADE  CHOLANGIOPANCREATOGRAPHY (ERCP) WITH PROPOFOL N/A 06/14/2020   Procedure: ENDOSCOPIC RETROGRADE CHOLANGIOPANCREATOGRAPHY (ERCP) WITH PROPOFOL;  Surgeon: Milus Banister, MD;  [facility];  Service: Endoscopy;  Laterality: N/A;   ESOPHAGOGASTRODUODENOSCOPY  2011   peptic stricture s/p dialtion, miuld gastritis likely NSAID related. No H. pylori   ESOPHAGOGASTRODUODENOSCOPY N/A 10/28/2019   Procedure: ESOPHAGOGASTRODUODENOSCOPY (EGD);  Surgeon: Danie Binder, MD; Normal-appearing esophagus s/p empiric dilation, single gastric polyp resected and retrieved, mild gastritis s/p biopsy.  Pathology with fundic gland polyp.   ESOPHAGOGASTRODUODENOSCOPY (EGD) WITH PROPOFOL N/A 05/22/2022   Procedure: ESOPHAGOGASTRODUODENOSCOPY (EGD) WITH PROPOFOL;  Surgeon: Eloise Harman, DO;  [facility];  Service: Endoscopy;  Laterality: N/A;  11:15 am   JOINT REPLACEMENT     POLYPECTOMY  10/09/2020   Procedure: POLYPECTOMY;  Surgeon: Eloise Harman, DO;  [facility];  Service: Endoscopy;;   REPLACEMENT TOTAL KNEE Bilateral    SAVORY DILATION N/A 10/28/2019   Procedure: SAVORY DILATION;  Surgeon: Danie Binder, MD;  [facility];  Service: Endoscopy;  Laterality: N/A;   SHOULDER ARTHROSCOPY WITH ROTATOR CUFF REPAIR AND SUBACROMIAL DECOMPRESSION Left 05/05/2019   Procedure: Left Shoulder Arthroscopy and Rotator Cuff Repair ACROMIOPLASTY;  Surgeon: Melrose Nakayama, MD;  Location: [place];  Service: Orthopedics;  Laterality: Left;   SPHINCTEROTOMY  06/14/2020   Procedure: SPHINCTEROTOMY;  Surgeon: Milus Banister, MD;  [facility];  Service: Endoscopy;;  balloon sweep    Family History  Problem Relation Age of Onset   Congestive Heart Failure  Mother    Diabetes Mother    Hypertension Mother    Diabetes Father    Osteoarthritis Father    Colon cancer Neg Hx     Social History   Tobacco Use   Smoking status: Never   Smokeless  tobacco: Never  Vaping Use   Vaping Use: Never used  Substance Use Topics   Alcohol use: No   Drug use: No    No current facility-administered medications for this encounter.  Current Outpatient Medications:    cephALEXin (KEFLEX) 500 MG capsule, Take 1 capsule (500 mg total) by mouth 2 (two) times daily., Disp: 14 capsule, Rfl: 0   phenazopyridine (PYRIDIUM) 200 MG tablet, Take 1 tablet (200 mg total) by mouth 3 (three) times daily as needed for pain., Disp: 6 tablet, Rfl: 0   acetaminophen (TYLENOL) 650 MG CR tablet, Take 1,300 mg by mouth 2 (two) times daily., Disp: , Rfl:    aspirin 325 MG tablet, Take 325 mg by mouth at bedtime. , Disp: , Rfl:    Coenzyme Q10 (COQ10) 200 MG CAPS, Take 200 mg by mouth at bedtime., Disp: , Rfl:    diclofenac Sodium (VOLTAREN) 1 % GEL, Apply 1 Application topically 2 (two) times daily as needed (pain)., Disp: , Rfl:    Lidocaine, Anorectal, (RECTICARE EX), Apply 1 application  topically as needed (irritation)., Disp: , Rfl:    oxybutynin (DITROPAN) 5 MG tablet, Take 5 mg by mouth at bedtime., Disp: , Rfl:    pantoprazole (PROTONIX) 40 MG tablet, Take 1 tablet (40 mg total) by mouth 2 (two) times daily before a meal., Disp: 60 tablet, Rfl: 11   ramipril (ALTACE) 2.5 MG capsule, Take 2.5 mg by mouth daily., Disp: , Rfl:    simvastatin (ZOCOR) 40 MG tablet, Take 40 mg by mouth at bedtime., Disp: , Rfl:   No Known Allergies   ROS  As noted in HPI.   Physical Exam  BP (!) 169/97 (BP Location: Right Arm)   Pulse 71   Temp 97.9 F (36.6 C) (Oral)   Resp 18   SpO2 95%   Constitutional: Well developed, well nourished, no acute distress Eyes:  EOMI, conjunctiva normal bilaterally HENT: Normocephalic, atraumatic,mucus membranes moist Respiratory: Normal inspiratory effort Cardiovascular: Normal rate GI: nondistended, no suprapubic, flank tenderness Back: No CVAT skin: No rash, skin intact Musculoskeletal: no deformities Neurologic: Alert &  oriented x 3, no focal neuro deficits Psychiatric: Speech and behavior appropriate   ED Course   Medications - No data to display  Orders Placed This Encounter  Procedures   Urine Culture    Standing Status:   Standing    Number of Occurrences:   1    Order Specific Question:   Indication    Answer:   Dysuria   POCT urinalysis dipstick    Standing Status:   Standing    Number of Occurrences:   1    Results for orders placed or performed during the hospital encounter of 07/20/22 (from the past 24 hour(s))  POCT urinalysis dipstick     Status: Abnormal   Collection Time: 07/20/22 12:31 PM  Result Value Ref Range   Color, UA yellow yellow   Clarity, UA hazy (A) clear   Glucose, UA negative negative mg/dL   Bilirubin, UA negative negative   Ketones, POC UA negative negative mg/dL   Spec Grav, UA 1.025 1.010 - 1.025   Blood, UA moderate (A) negative   pH, UA 5.5 5.0 -  8.0   Protein Ur, POC =30 (A) negative mg/dL   Urobilinogen, UA 0.2 0.2 or 1.0 E.U./dL   Nitrite, UA Negative Negative   Leukocytes, UA Small (1+) (A) Negative   No results found.  ED Clinical Impression  1. Urinary tract infection with hematuria, site unspecified      ED Assessment/Plan    Previous records and labs reviewed.  As noted in HPI  UA with moderate hematuria, protein urine small leukocytes.  H&P consistent with a UTI.  Sending urine off for culture to confirm antibiotic choice and diagnosis.  Patient states Keflex worked well for her in the past.   will try Keflex again with Pyridium.  Follow-up with PCP as needed.  Written ER return precautions given.  Discussed labs, MDM, treatment plan, and plan for follow-up with patient. Discussed sn/sx that should prompt return to the ED. patient agrees with plan.   Meds ordered this encounter  Medications   cephALEXin (KEFLEX) 500 MG capsule    Sig: Take 1 capsule (500 mg total) by mouth 2 (two) times daily.    Dispense:  14 capsule    Refill:  0    phenazopyridine (PYRIDIUM) 200 MG tablet    Sig: Take 1 tablet (200 mg total) by mouth 3 (three) times daily as needed for pain.    Dispense:  6 tablet    Refill:  0      *This clinic note was created using [employer]. Therefore, there may be occasional mistakes despite careful proofreading.  ?    Melynda Ripple, MD 07/20/22 1801

## 2022-07-22 LAB — URINE CULTURE: Culture: 50000 — AB

## 2022-08-03 NOTE — Progress Notes (Unsigned)
GI Office Note    Referring Provider: Asencion Noble, MD Primary Care Physician:  Asencion Noble, MD Primary Gastroenterologist: Elon Alas. Abbey Chatters, DO  Date:  08/04/2022  ID:  [name], DOB [birth year], MRN [id number]   Chief Complaint   Chief Complaint  Patient presents with   Follow-up    Doing well    History of Present Illness  [name] is a 71 y.o. female with a history of fatty liver, elevated alk phos in the setting of CBD stone s/p ERCP in September 2021, GERD, dysphagia, improved with empiric dilation in January 2021, intermittent diarrhea secondary to dietary intolerance and possibly IBS, hemorrhoids, and adenomatous colon polyps presenting today for follow up.  Colonoscopy January 2022: non bleeding internal hemorrhoids, sigmoid and descending diverticulosis, 25m tubular adenoma. Repeat in 5 years.   Seen in April for postprandial heaviness/bloating in RUQ. Only using omeprazole as needed previously but taking daily since her symptoms began. Gastritis suspected due to  NSAIDs. Reported decreased appetite and 8lb weight loss . Noted to have hernia above umbilicus or diastasis recti. CT if symptoms worsen, omeprazole daily, and update labs. Celiac screen negative. CBC and lipase unremarkable. CMP with elevated alk phos. Recommended to repeat LFTs, GTT, and AMA in 3 months.   Last seen 04/24/22 noting to feel a knot in the epigastric region worse with sitting or leaning back, unable to breathe past it. Still with heavy feeling like she has eaten a full meal. Taking omeprazole daily. BM sometimes helps. Thought her symptoms were similar when she had bile duct issues. Admitted to taking 4 ibuprofen tablets daily. Denied further weight loss. Plan to update labs and obtain CT abdomen/pelvis.   CT A/P 04/30/22 with borderline cardiomegaly, evidence of cholecystectomy, common hepatic duct 10 mm, CBD 619m equivocal wall thickening in the CBD, pancreas and spleen unremarkable. Few  scattered colonic diverticula. Lumbar foraminal impingement.   Labs 04/30/22: HFP wnl,  GGT 16, AMA normal.   MR/MRCP 05/02/22 with diffuse hepatic steatosis, common hepatic duct 11 mm and CBD 31m25mith gentle tapering of the CBD without choledocholithiasis and no evidence of biliary ductal wall thickening or peribiliary enhancement, fatty pancreatic atrophy without ductal dilation. Scheduled for EGD to further evaluate abdominal pain.   EGD 05/22/22: normal esophagus and duodenum. Gastritis s/p biopsy with minimal vascular ectasia without h. pylori. Advised omeprazole 40 mg BID for 3 months then daily and avoid NSAIDs.   Today: Reports that when she eats something hot she may still have some loose stools or if she eats too fast. But is felling a lot better. Now its only happening about once per month. Denies dysphagia. Has baseline decreased appetite. Does admit to still eating a fair amount of fried foods. Usually eats fried fish at least once a week.   Has not had anymore belly pain. Had a heavy feeling on her abdomen that is improved. Is taking pantoprazole twice daily. Has not had any breakthrough symptoms. Is working toward avoiding NSAIDs. Has been seeing a chiropractor due to her spinal stenosis. Takes ibuprofen 200 mg daily for a few days and then stops and then may resume again when she pain. Denies melena or BRBPR. Denies constipation.    Current Outpatient Medications  Medication Sig Dispense Refill   acetaminophen (TYLENOL) 650 MG CR tablet Take 1,300 mg by mouth 2 (two) times daily.     aspirin 325 MG tablet Take 325 mg by mouth at bedtime.      Coenzyme  Q10 (COQ10) 200 MG CAPS Take 200 mg by mouth at bedtime.     diclofenac Sodium (VOLTAREN) 1 % GEL Apply 1 Application topically 2 (two) times daily as needed (pain).     Lidocaine, Anorectal, (RECTICARE EX) Apply 1 application  topically as needed (irritation).     oxybutynin (DITROPAN) 5 MG tablet Take 5 mg by mouth at bedtime.      pantoprazole (PROTONIX) 40 MG tablet Take 1 tablet (40 mg total) by mouth 2 (two) times daily before a meal. 60 tablet 11   ramipril (ALTACE) 2.5 MG capsule Take 2.5 mg by mouth daily.     simvastatin (ZOCOR) 40 MG tablet Take 40 mg by mouth at bedtime.     No current facility-administered medications for this visit.    Past Medical History:  Diagnosis Date   Gastritis    GERD (gastroesophageal reflux disease)    HTN (hypertension)    Hyperlipidemia    TIA (transient ischemic attack) 2004   no residual    Past Surgical History:  Procedure Laterality Date   BIOPSY  10/28/2019   Procedure: BIOPSY;  Surgeon: Danie Binder, MD;  [facility];  Service: Endoscopy;;  gastric    BIOPSY  05/22/2022   Procedure: BIOPSY;  Surgeon: Eloise Harman, DO;  [facility];  Service: Endoscopy;;   BREAST REDUCTION SURGERY     CHOLECYSTECTOMY N/A 02/29/2016   Procedure: LAPAROSCOPIC CHOLECYSTECTOMY;  Surgeon: Aviva Signs, MD;  Location: [facility];  Service: General;  Laterality: N/A;   COLONOSCOPY  04/13/2006   [id number]:[id number] colon without evidence of polyps, masses, inflammatory changes diverticular or vascular ectasia/Normal retroflex view of the rectum   COLONOSCOPY WITH PROPOFOL N/A 10/09/2020   Surgeon: Eloise Harman, DO; nonbleeding internal hemorrhoids, diverticulosis in the sigmoid and descending colon, 6 mm tubular adenoma.  Recommended repeat in 5 years.   ENDOSCOPIC RETROGRADE CHOLANGIOPANCREATOGRAPHY (ERCP) WITH PROPOFOL N/A 06/14/2020   Procedure: ENDOSCOPIC RETROGRADE CHOLANGIOPANCREATOGRAPHY (ERCP) WITH PROPOFOL;  Surgeon: Milus Banister, MD;  [facility];  Service: Endoscopy;  Laterality: N/A;   ESOPHAGOGASTRODUODENOSCOPY  2011   peptic stricture s/p dialtion, miuld gastritis likely NSAID related. No H. pylori   ESOPHAGOGASTRODUODENOSCOPY N/A 10/28/2019   Procedure: ESOPHAGOGASTRODUODENOSCOPY (EGD);  Surgeon: Danie Binder, MD; Normal-appearing  esophagus s/p empiric dilation, single gastric polyp resected and retrieved, mild gastritis s/p biopsy.  Pathology with fundic gland polyp.   ESOPHAGOGASTRODUODENOSCOPY (EGD) WITH PROPOFOL N/A 05/22/2022   Procedure: ESOPHAGOGASTRODUODENOSCOPY (EGD) WITH PROPOFOL;  Surgeon: Eloise Harman, DO;  [facility];  Service: Endoscopy;  Laterality: N/A;  11:15 am   JOINT REPLACEMENT     POLYPECTOMY  10/09/2020   Procedure: POLYPECTOMY;  Surgeon: Eloise Harman, DO;  [facility];  Service: Endoscopy;;   REPLACEMENT TOTAL KNEE Bilateral    SAVORY DILATION N/A 10/28/2019   Procedure: SAVORY DILATION;  Surgeon: Danie Binder, MD;  [facility];  Service: Endoscopy;  Laterality: N/A;   SHOULDER ARTHROSCOPY WITH ROTATOR CUFF REPAIR AND SUBACROMIAL DECOMPRESSION Left 05/05/2019   Procedure: Left Shoulder Arthroscopy and Rotator Cuff Repair ACROMIOPLASTY;  Surgeon: Melrose Nakayama, MD;  Location: [facility];  Service: Orthopedics;  Laterality: Left;   SPHINCTEROTOMY  06/14/2020   Procedure: SPHINCTEROTOMY;  Surgeon: Milus Banister, MD;  [facility];  Service: Endoscopy;;  balloon sweep    Family History  Problem Relation Age of Onset   Congestive Heart Failure Mother  Diabetes Mother    Hypertension Mother    Diabetes Father    Osteoarthritis Father    Colon cancer Neg Hx     Allergies as of 08/04/2022   (No Known Allergies)    Social History   Socioeconomic History   Marital status: Married    Spouse name: Not on file   Number of children: Not on file   Years of education: Not on file   Highest education level: Not on file  Occupational History   Occupation: [place]    Employer: [employer]  Tobacco Use   Smoking status: Never   Smokeless tobacco: Never  Vaping Use   Vaping Use: Never used  Substance and Sexual Activity   Alcohol use: No   Drug use: No   Sexual activity: Not  Currently  Other Topics Concern   Not on file  Social History Narrative   Not on file   Social Determinants of Health   Financial Resource Strain: Not on file  Food Insecurity: Not on file  Transportation Needs: Not on file  Physical Activity: Not on file  Stress: Not on file  Social Connections: Not on file     Review of Systems   Gen: Denies fever, chills, anorexia. Denies fatigue, weakness, weight loss.  CV: Denies chest pain, palpitations, syncope, peripheral edema, and claudication. Resp: Denies dyspnea at rest, cough, wheezing, coughing up blood, and pleurisy. GI: See HPI Derm: Denies rash, itching, dry skin Psych: Denies depression, anxiety, memory loss, confusion. No homicidal or suicidal ideation.  Heme: Denies bruising, bleeding, and enlarged lymph nodes.   Physical Exam   BP (!) 158/84 (BP Location: Right Arm, Patient Position: Sitting, Cuff Size: Large)   Pulse 66   Temp 98.3 F (36.8 C) (Oral)   Ht _0  (1.753 m)   Wt 278 lb 6.4 oz (126.3 kg)   SpO2 98%   BMI 41.11 kg/m   General:   Alert and oriented. No distress noted. Pleasant and cooperative.  Head:  Normocephalic and atraumatic. Eyes:  Conjuctiva clear without scleral icterus. Mouth:  Oral mucosa pink and moist. Good dentition. No lesions. Lungs:  Clear to auscultation bilaterally. No wheezes, rales, or rhonchi. No distress.  Heart:  S1, S2 present without murmurs appreciated.  Abdomen:  +BS, soft, non-tender and non-distended. No rebound or guarding. No HSM or masses noted. Rectal: deferred Msk:  Symmetrical without gross deformities. Normal posture. Extremities:  +1/+2 pitting edema to bilateral ankles Neurologic:  Alert and  oriented x4 Psych:  Alert and cooperative. Normal mood and affect.   Assessment  [name] is a 71 y.o. female with a history of fatty liver, elevated alk phos in the setting of CBD stone s/p ERCP in September 2021, GERD, dysphagia, improved with empiric dilation  in January 2021, intermittent diarrhea secondary to dietary intolerance and possibly IBS, hemorrhoids, and adenomatous colon polyps presenting today for follow up.  Abdominal pain, GERD: Previously with epigastric pain since April 2023. CT in July with 10 mm common hepatic duct, 44m CBD with equivocal wall thickening. MR/MRCP with diffuse hepatic steatosis, 125mcommon hepatic duct, 53m1mBD with gentle tapering and fatty pancreatic atrophy. EGD with gastritis with mild inflammation and minimal vascular ectasia. Advised pantoprazole 54m74mD for 3 months after procedure. Abdominal pain has resolved. Denies any reflux breakthrough symptoms. Continues to take occasional NSAIDs. We discussed that with continued NSAID use when she decreases her PPI she could have some worsening symptoms.  Does admit to eating fried fish once per week but denies adding extra salt to foods or excessive fried foods. Denies any melena or BRBPR. Does have a lack of appetite but this is baselines and her weight has been stable. Advised to continue pantoprazole with  BID dosing for another 3 weeks and then reduce to once daily.    Intermittent diarrhea: Still has symptoms about once per month but notices it occurs if she eats too fast or if she eats something that is hot. Denies abdominal pain. Symptoms fairly well controlled and appear to be secondary to dietary factors.   Elevated alk phos, fatty liver: History of elevation and known fatty liver. LFTs have remained within normal limits. Previous negative workup for ANA, ASMA, AMA. Most recent labs noted resolved alk phos to normal limits, negative AMA and normal GGT. Fatty liver diet. Advised to continue low sodium diet as her BP has been elevated and she has been experiencing some lower extremity edema.    PLAN   Continue pantoprazole 40 mg twice daily until 08/22/22 and then reduce to daily dosing.  GERD diet/lifestyle modifications Fatty liver diet Avoid NSAIDs Low sodium  diet.  Follow up in 6 months.  Follow up with PCP regarding increased BP and lower extremity edema.    Venetia Night, MSN, FNP-BC, AGACNP-BC [facility]

## 2022-08-04 ENCOUNTER — Encounter: Payer: Self-pay | Admitting: Gastroenterology

## 2022-08-04 ENCOUNTER — Ambulatory Visit (INDEPENDENT_AMBULATORY_CARE_PROVIDER_SITE_OTHER): Payer: Medicare HMO | Admitting: Gastroenterology

## 2022-08-04 VITALS — BP 158/84 | HR 66 | Temp 98.3°F | Ht 69.0 in | Wt 278.4 lb

## 2022-08-04 DIAGNOSIS — K219 Gastro-esophageal reflux disease without esophagitis: Secondary | ICD-10-CM | POA: Diagnosis not present

## 2022-08-04 DIAGNOSIS — R748 Abnormal levels of other serum enzymes: Secondary | ICD-10-CM

## 2022-08-04 DIAGNOSIS — R197 Diarrhea, unspecified: Secondary | ICD-10-CM

## 2022-08-04 DIAGNOSIS — K760 Fatty (change of) liver, not elsewhere classified: Secondary | ICD-10-CM | POA: Diagnosis not present

## 2022-08-04 NOTE — Patient Instructions (Signed)
Continue taking your pantoprazole 40 mg twice daily for the next 3 weeks and then reduce to once daily dosing.  If you have recurrence of symptoms you may go back to twice daily dosing.  Follow a GERD diet:  Avoid fried, fatty, greasy, spicy, citrus foods. Avoid caffeine and carbonated beverages. Avoid chocolate. Try eating 4-6 small meals a day rather than 3 large meals. Do not eat within 3 hours of laying down. Prop head of bed up on wood or bricks to create a 6 inch incline.  It was a pleasure to see you today. I want to create trusting relationships with patients. If you receive a survey regarding your visit,  I greatly appreciate you taking time to fill this out on paper or through your MyChart. I value your feedback.  Venetia Night, MSN, FNP-BC, AGACNP-BC [facility]

## 2022-08-08 ENCOUNTER — Ambulatory Visit
Admission: EM | Admit: 2022-08-08 | Discharge: 2022-08-08 | Disposition: A | Discharge status: Home / Self Care | Payer: Medicare HMO | Attending: Family Medicine | Admitting: Family Medicine

## 2022-08-08 DIAGNOSIS — R300 Dysuria: Secondary | ICD-10-CM | POA: Diagnosis present

## 2022-08-08 DIAGNOSIS — N390 Urinary tract infection, site not specified: Secondary | ICD-10-CM | POA: Diagnosis not present

## 2022-08-08 LAB — POCT URINALYSIS DIP (MANUAL ENTRY)
Bilirubin, UA: NEGATIVE
Glucose, UA: NEGATIVE mg/dL
Ketones, POC UA: NEGATIVE mg/dL
Nitrite, UA: NEGATIVE
Protein Ur, POC: NEGATIVE mg/dL
Spec Grav, UA: 1.015 (ref 1.010–1.025)
Urobilinogen, UA: 0.2 E.U./dL
pH, UA: 6 (ref 5.0–8.0)

## 2022-08-08 MED ORDER — SULFAMETHOXAZOLE-TRIMETHOPRIM 800-160 MG PO TABS: 1.0000 | ORAL_TABLET | Freq: Two times a day (BID) | ORAL | 0 refills | Status: DC

## 2022-08-08 NOTE — ED Triage Notes (Signed)
Pt reports uti symptoms. Started Monday with  painful and  frequent urination. Pt took ibuprofen today at 6:30am which helped with low back pain due to uti.

## 2022-08-08 NOTE — ED Provider Notes (Signed)
[facility]    CSN: [id number] Arrival date & time: 08/08/22  0354      History   Chief Complaint No chief complaint on file.   HPI [name] is a 71 y.o. female.   Patient presenting today with several day history of dysuria, urinary frequency.  Denies fever, chills, hematuria, nausea, vomiting, back pain.  So far trying ibuprofen with mild temporary relief of the burning.  Multiple urinary tract infections over the past several months.    Past Medical History:  Diagnosis Date   Gastritis    GERD (gastroesophageal reflux disease)    HTN (hypertension)    Hyperlipidemia    TIA (transient ischemic attack) 2004   no residual    Patient Active Problem List   Diagnosis Date Noted   RUQ pain 01/27/2022   Bloating 01/27/2022   Poor appetite 01/27/2022   Abdominal wall defect, acquired 01/27/2022   Rectal itching 08/24/2020   Colon cancer screening 08/24/2020   Diarrhea 02/22/2020   Abdominal pain, epigastric 07/29/2019   Elevated alkaline phosphatase level 07/29/2019   Acute cholecystitis 02/27/2016   DOE (dyspnea on exertion) 12/12/2013   Stiffness of joint, lower leg 09/10/2011   Bilateral leg weakness 09/10/2011   S/P TKR (total knee replacement) 08/10/2011   Hypertension 08/10/2011   Dyslipidemia 08/10/2011   Gastroesophageal reflux disease 08/10/2011   UTI (lower urinary tract infection) 08/10/2011   OTHER DYSPHAGIA 05/21/2010   CEREBROVASCULAR ACCIDENT, HX OF 05/17/2010    Past Surgical History:  Procedure Laterality Date   BIOPSY  10/28/2019   Procedure: BIOPSY;  Surgeon: Danie Binder, MD;  [facility];  Service: Endoscopy;;  gastric    BIOPSY  05/22/2022   Procedure: BIOPSY;  Surgeon: Eloise Harman, DO;  [facility];  Service: Endoscopy;;   BREAST REDUCTION SURGERY     CHOLECYSTECTOMY N/A 02/29/2016   Procedure: LAPAROSCOPIC CHOLECYSTECTOMY;  Surgeon: Aviva Signs, MD;  [facility];  Service:  General;  Laterality: N/A;   COLONOSCOPY  04/13/2006   SFK:CLEXNT colon without evidence of polyps, masses, inflammatory changes diverticular or vascular ectasia/Normal retroflex view of the rectum   COLONOSCOPY WITH PROPOFOL N/A 10/09/2020   Surgeon: Eloise Harman, DO; nonbleeding internal hemorrhoids, diverticulosis in the sigmoid and descending colon, 6 mm tubular adenoma.  Recommended repeat in 5 years.   ENDOSCOPIC RETROGRADE CHOLANGIOPANCREATOGRAPHY (ERCP) WITH PROPOFOL N/A 06/14/2020   Procedure: ENDOSCOPIC RETROGRADE CHOLANGIOPANCREATOGRAPHY (ERCP) WITH PROPOFOL;  Surgeon: Milus Banister, MD;  [facility];  Service: Endoscopy;  Laterality: N/A;   ESOPHAGOGASTRODUODENOSCOPY  2011   peptic stricture s/p dialtion, miuld gastritis likely NSAID related. No H. pylori   ESOPHAGOGASTRODUODENOSCOPY N/A 10/28/2019   Procedure: ESOPHAGOGASTRODUODENOSCOPY (EGD);  Surgeon: Danie Binder, MD; Normal-appearing esophagus s/p empiric dilation, single gastric polyp resected and retrieved, mild gastritis s/p biopsy.  Pathology with fundic gland polyp.   ESOPHAGOGASTRODUODENOSCOPY (EGD) WITH PROPOFOL N/A 05/22/2022   Procedure: ESOPHAGOGASTRODUODENOSCOPY (EGD) WITH PROPOFOL;  Surgeon: Eloise Harman, DO;  [facility];  Service: Endoscopy;  Laterality: N/A;  11:15 am   JOINT REPLACEMENT     POLYPECTOMY  10/09/2020   Procedure: POLYPECTOMY;  Surgeon: Eloise Harman, DO;  [facility];  Service: Endoscopy;;   REPLACEMENT TOTAL KNEE Bilateral    SAVORY DILATION N/A 10/28/2019   Procedure: SAVORY DILATION;  Surgeon: Danie Binder, MD;  [facility];  Service: Endoscopy;  Laterality: N/A;   SHOULDER ARTHROSCOPY WITH  ROTATOR CUFF REPAIR AND SUBACROMIAL DECOMPRESSION Left 05/05/2019   Procedure: Left Shoulder Arthroscopy and Rotator Cuff Repair ACROMIOPLASTY;  Surgeon: Melrose Nakayama, MD;  Location: [facility];  Service: Orthopedics;   Laterality: Left;   SPHINCTEROTOMY  06/14/2020   Procedure: SPHINCTEROTOMY;  Surgeon: Milus Banister, MD;  [facility];  Service: Endoscopy;;  balloon sweep    OB History   No obstetric history on file.      Home Medications    Prior to Admission medications   Medication Sig Start Date End Date Taking? Authorizing Provider  sulfamethoxazole-trimethoprim (BACTRIM DS) 800-160 MG tablet Take 1 tablet by mouth 2 (two) times daily. 08/08/22  Yes Volney American, PA-C  acetaminophen (TYLENOL) 650 MG CR tablet Take 1,300 mg by mouth 2 (two) times daily.    [provider]  aspirin 325 MG tablet Take 325 mg by mouth at bedtime.     [provider]  Coenzyme Q10 (COQ10) 200 MG CAPS Take 200 mg by mouth at bedtime.    [provider]  diclofenac Sodium (VOLTAREN) 1 % GEL Apply 1 Application topically 2 (two) times daily as needed (pain).    [provider]  Lidocaine, Anorectal, (RECTICARE EX) Apply 1 application  topically as needed (irritation).    [provider]  oxybutynin (DITROPAN) 5 MG tablet Take 5 mg by mouth at bedtime.    [provider]  pantoprazole (PROTONIX) 40 MG tablet Take 1 tablet (40 mg total) by mouth 2 (two) times daily before a meal. 05/22/22 05/22/23  Eloise Harman, DO  ramipril (ALTACE) 2.5 MG capsule Take 2.5 mg by mouth daily.    [provider]  simvastatin (ZOCOR) 40 MG tablet Take 40 mg by mouth at bedtime.    [provider]    Family History Family History  Problem Relation Age of Onset   Congestive Heart Failure Mother    Diabetes Mother    Hypertension Mother    Diabetes Father    Osteoarthritis Father    Colon cancer Neg Hx     Social History Social History   Tobacco Use   Smoking status: Never   Smokeless tobacco: Never  Vaping Use   Vaping Use: Never used  Substance Use Topics   Alcohol use: No   Drug use: No     Allergies   Patient has no known  allergies.   Review of Systems Review of Systems PER HPI  Physical Exam Triage Vital Signs ED Triage Vitals  Enc Vitals Group     BP 08/08/22 1026 (!) 182/95     Pulse Rate 08/08/22 1026 (!) 58     Resp 08/08/22 1026 18     Temp 08/08/22 1026 97.6 F (36.4 C)     Temp Source 08/08/22 1026 Oral     SpO2 08/08/22 1026 98 %     Weight --      Height --      Head Circumference --      Peak Flow --      Pain Score 08/08/22 1032 4     Pain Loc --      Pain Edu? --      Excl. in [place]? --    No data found.  Updated Vital Signs BP (!) 182/95 (BP Location: Right Arm)   Pulse (!) 58   Temp 97.6 F (36.4 C) (Oral)   Resp 18   SpO2 98%   Visual Acuity Right Eye  Distance:   Left Eye Distance:   Bilateral Distance:    Right Eye Near:   Left Eye Near:    Bilateral Near:     Physical Exam Vitals and nursing note reviewed.  Constitutional:      Appearance: Normal appearance. She is not ill-appearing.  HENT:     Head: Atraumatic.  Eyes:     Extraocular Movements: Extraocular movements intact.     Conjunctiva/sclera: Conjunctivae normal.  Cardiovascular:     Rate and Rhythm: Normal rate and regular rhythm.     Heart sounds: Normal heart sounds.  Pulmonary:     Effort: Pulmonary effort is normal.     Breath sounds: Normal breath sounds.  Abdominal:     General: Bowel sounds are normal. There is no distension.     Palpations: Abdomen is soft.     Tenderness: There is no abdominal tenderness. There is no right CVA tenderness, left CVA tenderness or guarding.  Musculoskeletal:        General: Normal range of motion.     Cervical back: Normal range of motion and neck supple.  Skin:    General: Skin is warm and dry.  Neurological:     Mental Status: She is alert and oriented to person, place, and time.  Psychiatric:        Mood and Affect: Mood normal.        Thought Content: Thought content normal.        Judgment: Judgment normal.      UC Treatments / Results   Labs (all labs ordered are listed, but only abnormal results are displayed) Labs Reviewed  POCT URINALYSIS DIP (MANUAL ENTRY) - Abnormal; Notable for the following components:      Result Value   Blood, UA small (*)    Leukocytes, UA Small (1+) (*)    All other components within normal limits  URINE CULTURE    EKG   Radiology No results found.  Procedures Procedures (including critical care time)  Medications Ordered in UC Medications - No data to display  Initial Impression / Assessment and Plan / UC Course  I have reviewed the triage vital signs and the nursing notes.  Pertinent labs & imaging results that were available during my care of the patient were reviewed by me and considered in my medical decision making (see chart for details).     Vital signs and exam overall reassuring though hypertensive in triage.  Urinalysis with evidence of a possible urinary tract infection, will treat with Bactrim while awaiting urine culture for confirmation.  Adjust if needed.  Return for worsening symptoms.  Final Clinical Impressions(s) / UC Diagnoses   Final diagnoses:  Dysuria  Acute lower UTI   Discharge Instructions   None    ED Prescriptions     Medication Sig Dispense Auth. Provider   sulfamethoxazole-trimethoprim (BACTRIM DS) 800-160 MG tablet Take 1 tablet by mouth 2 (two) times daily. 6 tablet Volney American, [place]      PDMP not reviewed this encounter.   Volney American, [place] 08/08/22 1139

## 2022-08-10 LAB — URINE CULTURE

## 2022-08-27 ENCOUNTER — Ambulatory Visit
Admission: EM | Admit: 2022-08-27 | Discharge: 2022-08-27 | Disposition: A | Discharge status: Home / Self Care | Payer: Medicare HMO | Attending: Family Medicine | Admitting: Family Medicine

## 2022-08-27 DIAGNOSIS — N390 Urinary tract infection, site not specified: Secondary | ICD-10-CM

## 2022-08-27 LAB — POCT URINALYSIS DIP (MANUAL ENTRY)
Bilirubin, UA: NEGATIVE
Glucose, UA: NEGATIVE mg/dL
Ketones, POC UA: NEGATIVE mg/dL
Nitrite, UA: NEGATIVE
Protein Ur, POC: NEGATIVE mg/dL
Spec Grav, UA: 1.025 (ref 1.010–1.025)
Urobilinogen, UA: 0.2 E.U./dL
pH, UA: 6 (ref 5.0–8.0)

## 2022-08-27 MED ORDER — CEPHALEXIN 500 MG PO CAPS: 500.0000 mg | ORAL_CAPSULE | Freq: Two times a day (BID) | ORAL | 0 refills | Status: DC

## 2022-08-27 NOTE — ED Triage Notes (Signed)
Pt reports she is having urinary pain, cloudy, low back pain which started yesterday  Reoccurring uti

## 2022-08-27 NOTE — ED Provider Notes (Signed)
[facility]    CSN: [id number] Arrival date & time: 08/27/22  9892      History   Chief Complaint No chief complaint on file.   HPI [name] is a 71 y.o. female.   Patient presenting today with 1 day history of dysuria, cloudy urine.  Denies fever, chills, flank pain, nausea, vomiting, hematuria.  Supplements or anything over-the-counter for symptoms.  History of UTIs.    Past Medical History:  Diagnosis Date   Gastritis    GERD (gastroesophageal reflux disease)    HTN (hypertension)    Hyperlipidemia    TIA (transient ischemic attack) 2004   no residual    Patient Active Problem List   Diagnosis Date Noted   RUQ pain 01/27/2022   Bloating 01/27/2022   Poor appetite 01/27/2022   Abdominal wall defect, acquired 01/27/2022   Rectal itching 08/24/2020   Colon cancer screening 08/24/2020   Diarrhea 02/22/2020   Abdominal pain, epigastric 07/29/2019   Elevated alkaline phosphatase level 07/29/2019   Acute cholecystitis 02/27/2016   DOE (dyspnea on exertion) 12/12/2013   Stiffness of joint, lower leg 09/10/2011   Bilateral leg weakness 09/10/2011   S/P TKR (total knee replacement) 08/10/2011   Hypertension 08/10/2011   Dyslipidemia 08/10/2011   Gastroesophageal reflux disease 08/10/2011   UTI (lower urinary tract infection) 08/10/2011   OTHER DYSPHAGIA 05/21/2010   CEREBROVASCULAR ACCIDENT, HX OF 05/17/2010    Past Surgical History:  Procedure Laterality Date   BIOPSY  10/28/2019   Procedure: BIOPSY;  Surgeon: Danie Binder, MD;  Location: [facility];  Service: Endoscopy;;  gastric    BIOPSY  05/22/2022   Procedure: BIOPSY;  Surgeon: Eloise Harman, DO;  Location: [facility];  Service: Endoscopy;;   BREAST REDUCTION SURGERY     CHOLECYSTECTOMY N/A 02/29/2016   Procedure: LAPAROSCOPIC CHOLECYSTECTOMY;  Surgeon: Aviva Signs, MD;  Location: [facility];  Service: General;  Laterality: N/A;   COLONOSCOPY  04/13/2006   [facility]:ERDEYC colon  without evidence of polyps, masses, inflammatory changes diverticular or vascular ectasia/Normal retroflex view of the rectum   COLONOSCOPY WITH PROPOFOL N/A 10/09/2020   Surgeon: Eloise Harman, DO; nonbleeding internal hemorrhoids, diverticulosis in the sigmoid and descending colon, 6 mm tubular adenoma.  Recommended repeat in 5 years.   ENDOSCOPIC RETROGRADE CHOLANGIOPANCREATOGRAPHY (ERCP) WITH PROPOFOL N/A 06/14/2020   Procedure: ENDOSCOPIC RETROGRADE CHOLANGIOPANCREATOGRAPHY (ERCP) WITH PROPOFOL;  Surgeon: Milus Banister, MD;  Location: [facility];  Service: Endoscopy;  Laterality: N/A;   ESOPHAGOGASTRODUODENOSCOPY  2011   peptic stricture s/p dialtion, miuld gastritis likely NSAID related. No H. pylori   ESOPHAGOGASTRODUODENOSCOPY N/A 10/28/2019   Procedure: ESOPHAGOGASTRODUODENOSCOPY (EGD);  Surgeon: Danie Binder, MD; Normal-appearing esophagus s/p empiric dilation, single gastric polyp resected and retrieved, mild gastritis s/p biopsy.  Pathology with fundic gland polyp.   ESOPHAGOGASTRODUODENOSCOPY (EGD) WITH PROPOFOL N/A 05/22/2022   Procedure: ESOPHAGOGASTRODUODENOSCOPY (EGD) WITH PROPOFOL;  Surgeon: Eloise Harman, DO;  [facility];  Service: Endoscopy;  Laterality: N/A;  11:15 am   JOINT REPLACEMENT     POLYPECTOMY  10/09/2020   Procedure: POLYPECTOMY;  Surgeon: Eloise Harman, DO;  [facility];  Service: Endoscopy;;   REPLACEMENT TOTAL KNEE Bilateral    SAVORY DILATION N/A 10/28/2019   Procedure: SAVORY DILATION;  Surgeon: Danie Binder, MD;  [facility];  Service: Endoscopy;  Laterality: N/A;   SHOULDER ARTHROSCOPY WITH ROTATOR CUFF REPAIR AND SUBACROMIAL DECOMPRESSION Left 05/05/2019   Procedure:  Left Shoulder Arthroscopy and Rotator Cuff Repair ACROMIOPLASTY;  Surgeon: Melrose Nakayama, MD;  Location: [place];  Service: Orthopedics;  Laterality: Left;   SPHINCTEROTOMY  06/14/2020   Procedure: SPHINCTEROTOMY;   Surgeon: Milus Banister, MD;  Location: [facility];  Service: Endoscopy;;  balloon sweep    OB History   No obstetric history on file.      Home Medications    Prior to Admission medications   Medication Sig Start Date End Date Taking? Authorizing Provider  cephALEXin (KEFLEX) 500 MG capsule Take 1 capsule (500 mg total) by mouth 2 (two) times daily. 08/27/22  Yes Volney American, PA-C  lisinopril-hydrochlorothiazide (ZESTORETIC) 20-25 MG tablet Take 1 tablet by mouth daily.   Yes [provider]  acetaminophen (TYLENOL) 650 MG CR tablet Take 1,300 mg by mouth 2 (two) times daily.    [provider]  aspirin 325 MG tablet Take 325 mg by mouth at bedtime.     [provider]  Coenzyme Q10 (COQ10) 200 MG CAPS Take 200 mg by mouth at bedtime.    [provider]  diclofenac Sodium (VOLTAREN) 1 % GEL Apply 1 Application topically 2 (two) times daily as needed (pain).    [provider]  Lidocaine, Anorectal, (RECTICARE EX) Apply 1 application  topically as needed (irritation).    [provider]  oxybutynin (DITROPAN) 5 MG tablet Take 5 mg by mouth at bedtime.    [provider]  pantoprazole (PROTONIX) 40 MG tablet Take 1 tablet (40 mg total) by mouth 2 (two) times daily before a meal. 05/22/22 05/22/23  Eloise Harman, DO  ramipril (ALTACE) 2.5 MG capsule Take 2.5 mg by mouth daily.    [provider]  simvastatin (ZOCOR) 40 MG tablet Take 40 mg by mouth at bedtime.    [provider]  sulfamethoxazole-trimethoprim (BACTRIM DS) 800-160 MG tablet Take 1 tablet by mouth 2 (two) times daily. 08/08/22   Volney American, PA-C    Family History Family History  Problem Relation Age of Onset   Congestive Heart Failure Mother    Diabetes Mother    Hypertension Mother    Diabetes Father    Osteoarthritis Father    Colon cancer Neg Hx     Social History Social History   Tobacco Use   Smoking  status: Never   Smokeless tobacco: Never  Vaping Use   Vaping Use: Never used  Substance Use Topics   Alcohol use: No   Drug use: No     Allergies   Patient has no known allergies.   Review of Systems Review of Systems Per HPI  Physical Exam Triage Vital Signs ED Triage Vitals  Enc Vitals Group     BP 08/27/22 0858 119/73     Pulse Rate 08/27/22 0858 71     Resp 08/27/22 0858 18     Temp 08/27/22 0858 97.7 F (36.5 C)     Temp Source 08/27/22 0858 Oral     SpO2 08/27/22 0858 96 %     Weight --      Height --      Head Circumference --      Peak Flow --      Pain Score 08/27/22 0904 5     Pain Loc --      Pain Edu? --      Excl. in [place]? --    No data found.  Updated Vital Signs BP 119/73 (BP Location:  Right Arm)   Pulse 71   Temp 97.7 F (36.5 C) (Oral)   Resp 18   SpO2 96%   Visual Acuity Right Eye Distance:   Left Eye Distance:   Bilateral Distance:    Right Eye Near:   Left Eye Near:    Bilateral Near:     Physical Exam Vitals and nursing note reviewed.  Constitutional:      Appearance: Normal appearance. She is not ill-appearing.  HENT:     Head: Atraumatic.     Mouth/Throat:     Mouth: Mucous membranes are moist.  Eyes:     Extraocular Movements: Extraocular movements intact.     Conjunctiva/sclera: Conjunctivae normal.  Cardiovascular:     Rate and Rhythm: Normal rate and regular rhythm.     Heart sounds: Normal heart sounds.  Pulmonary:     Effort: Pulmonary effort is normal.     Breath sounds: Normal breath sounds.  Abdominal:     General: Bowel sounds are normal. There is no distension.     Palpations: Abdomen is soft.     Tenderness: There is no abdominal tenderness. There is no right CVA tenderness, left CVA tenderness or guarding.  Musculoskeletal:        General: Normal range of motion.     Cervical back: Normal range of motion and neck supple.  Skin:    General: Skin is warm and dry.  Neurological:     Mental Status: She  is alert and oriented to person, place, and time.     Motor: No weakness.     Gait: Gait normal.  Psychiatric:        Mood and Affect: Mood normal.        Thought Content: Thought content normal.        Judgment: Judgment normal.    UC Treatments / Results  Labs (all labs ordered are listed, but only abnormal results are displayed) Labs Reviewed  POCT URINALYSIS DIP (MANUAL ENTRY) - Abnormal; Notable for the following components:      Result Value   Blood, UA small (*)    Leukocytes, UA Large (3+) (*)    All other components within normal limits  URINE CULTURE    EKG   Radiology No results found.  Procedures Procedures (including critical care time)  Medications Ordered in UC Medications - No data to display  Initial Impression / Assessment and Plan / UC Course  I have reviewed the triage vital signs and the nursing notes.  Pertinent labs & imaging results that were available during my care of the patient were reviewed by me and considered in my medical decision making (see chart for details).     Vitals and exam reassuring today, urinalysis with evidence of a possible urinary tract infection.  Urine culture pending, treat with Keflex, fluids and adjust if needed.  Return for worsening symptoms.  Final Clinical Impressions(s) / UC Diagnoses   Final diagnoses:  Acute lower UTI   Discharge Instructions   None    ED Prescriptions     Medication Sig Dispense Auth. Provider   cephALEXin (KEFLEX) 500 MG capsule Take 1 capsule (500 mg total) by mouth 2 (two) times daily. 10 capsule Volney American, [place]      PDMP not reviewed this encounter.   Merrie Roof [place], [place] 08/27/22 [phone]

## 2022-08-30 LAB — URINE CULTURE: Culture: 80000 — AB

## 2022-09-25 ENCOUNTER — Ambulatory Visit: Payer: Medicare HMO

## 2022-12-16 ENCOUNTER — Encounter: Payer: Self-pay | Admitting: Internal Medicine

## 2023-01-06 ENCOUNTER — Other Ambulatory Visit: Payer: Self-pay

## 2023-01-06 ENCOUNTER — Ambulatory Visit
Admission: RE | Admit: 2023-01-06 | Discharge: 2023-01-06 | Disposition: A | Discharge status: Home / Self Care | Payer: Medicare HMO | Source: Ambulatory Visit | Attending: Nurse Practitioner | Admitting: Nurse Practitioner

## 2023-01-06 VITALS — BP 113/67 | HR 76 | Temp 97.9°F | Resp 20

## 2023-01-06 DIAGNOSIS — N390 Urinary tract infection, site not specified: Secondary | ICD-10-CM | POA: Insufficient documentation

## 2023-01-06 LAB — POCT URINALYSIS DIP (MANUAL ENTRY)
Bilirubin, UA: NEGATIVE
Glucose, UA: NEGATIVE mg/dL
Ketones, POC UA: NEGATIVE mg/dL
Nitrite, UA: POSITIVE — AB
Protein Ur, POC: NEGATIVE mg/dL
Spec Grav, UA: 1.015 (ref 1.010–1.025)
Urobilinogen, UA: 0.2 E.U./dL
pH, UA: 5.5 (ref 5.0–8.0)

## 2023-01-06 MED ORDER — CEPHALEXIN 500 MG PO CAPS: 500.0000 mg | ORAL_CAPSULE | Freq: Two times a day (BID) | ORAL | 0 refills | Status: DC

## 2023-01-06 MED ORDER — PHENAZOPYRIDINE HCL 200 MG PO TABS: 200.0000 mg | ORAL_TABLET | Freq: Three times a day (TID) | ORAL | 0 refills | Status: DC | PRN

## 2023-01-06 NOTE — ED Provider Notes (Signed)
[facility]    CSN: [id number] Arrival date & time: 01/06/23  0955      History   Chief Complaint Chief Complaint  Patient presents with   Urinary Frequency    Entered by patient    HPI [name] is a 72 y.o. female.   Patient presenting today with 1 week history of urinary frequency, cloudy urine, dysuria, nausea, fatigue, urgency.  Denies fever, abdominal or pelvic pain, hematuria, flank pain, vomiting, diarrhea.  So far not trying anything over-the-counter for symptoms.  Does have a history of urinary tract infections.    Past Medical History:  Diagnosis Date   Gastritis    GERD (gastroesophageal reflux disease)    HTN (hypertension)    Hyperlipidemia    TIA (transient ischemic attack) 2004   no residual    Patient Active Problem List   Diagnosis Date Noted   RUQ pain 01/27/2022   Bloating 01/27/2022   Poor appetite 01/27/2022   Abdominal wall defect, acquired 01/27/2022   Rectal itching 08/24/2020   Colon cancer screening 08/24/2020   Diarrhea 02/22/2020   Abdominal pain, epigastric 07/29/2019   Elevated alkaline phosphatase level 07/29/2019   Acute cholecystitis 02/27/2016   DOE (dyspnea on exertion) 12/12/2013   Stiffness of joint, lower leg 09/10/2011   Bilateral leg weakness 09/10/2011   S/P TKR (total knee replacement) 08/10/2011   Hypertension 08/10/2011   Dyslipidemia 08/10/2011   Gastroesophageal reflux disease 08/10/2011   UTI (lower urinary tract infection) 08/10/2011   OTHER DYSPHAGIA 05/21/2010   CEREBROVASCULAR ACCIDENT, HX OF 05/17/2010    Past Surgical History:  Procedure Laterality Date   BIOPSY  10/28/2019   Procedure: BIOPSY;  Surgeon: Danie Binder, MD;  [facility];  Service: Endoscopy;;  gastric    BIOPSY  05/22/2022   Procedure: BIOPSY;  Surgeon: Eloise Harman, DO;  [facility];  Service: Endoscopy;;   BREAST REDUCTION SURGERY     CHOLECYSTECTOMY N/A 02/29/2016   Procedure:  LAPAROSCOPIC CHOLECYSTECTOMY;  Surgeon: Aviva Signs, MD;  Location: [facility];  Service: General;  Laterality: N/A;   COLONOSCOPY  04/13/2006   [id number] colon without evidence of polyps, masses, inflammatory changes diverticular or vascular ectasia/Normal retroflex view of the rectum   COLONOSCOPY WITH PROPOFOL N/A 10/09/2020   Surgeon: Eloise Harman, DO; nonbleeding internal hemorrhoids, diverticulosis in the sigmoid and descending colon, 6 mm tubular adenoma.  Recommended repeat in 5 years.   ENDOSCOPIC RETROGRADE CHOLANGIOPANCREATOGRAPHY (ERCP) WITH PROPOFOL N/A 06/14/2020   Procedure: ENDOSCOPIC RETROGRADE CHOLANGIOPANCREATOGRAPHY (ERCP) WITH PROPOFOL;  Surgeon: Milus Banister, MD;  [facility];  Service: Endoscopy;  Laterality: N/A;   ESOPHAGOGASTRODUODENOSCOPY  2011   peptic stricture s/p dialtion, miuld gastritis likely NSAID related. No H. pylori   ESOPHAGOGASTRODUODENOSCOPY N/A 10/28/2019   Procedure: ESOPHAGOGASTRODUODENOSCOPY (EGD);  Surgeon: Danie Binder, MD; Normal-appearing esophagus s/p empiric dilation, single gastric polyp resected and retrieved, mild gastritis s/p biopsy.  Pathology with fundic gland polyp.   ESOPHAGOGASTRODUODENOSCOPY (EGD) WITH PROPOFOL N/A 05/22/2022   Procedure: ESOPHAGOGASTRODUODENOSCOPY (EGD) WITH PROPOFOL;  Surgeon: Eloise Harman, DO;  [facility];  Service: Endoscopy;  Laterality: N/A;  11:15 am   JOINT REPLACEMENT     POLYPECTOMY  10/09/2020   Procedure: POLYPECTOMY;  Surgeon: Eloise Harman, DO;  [facility];  Service: Endoscopy;;   REPLACEMENT TOTAL KNEE Bilateral    SAVORY DILATION N/A 10/28/2019   Procedure: SAVORY DILATION;  Surgeon: Danie Binder, MD;  [facility];  Service: Endoscopy;  Laterality: N/A;   SHOULDER ARTHROSCOPY WITH ROTATOR CUFF REPAIR AND SUBACROMIAL DECOMPRESSION Left 05/05/2019   Procedure: Left Shoulder Arthroscopy and Rotator Cuff Repair ACROMIOPLASTY;  Surgeon:  Melrose Nakayama, MD;  Location: [place];  Service: Orthopedics;  Laterality: Left;   SPHINCTEROTOMY  06/14/2020   Procedure: SPHINCTEROTOMY;  Surgeon: Milus Banister, MD;  Location: [facility];  Service: Endoscopy;;  balloon sweep    OB History   No obstetric history on file.      Home Medications    Prior to Admission medications   Medication Sig Start Date End Date Taking? Authorizing Provider  phenazopyridine (PYRIDIUM) 200 MG tablet Take 1 tablet (200 mg total) by mouth 3 (three) times daily as needed for pain. 01/06/23  Yes Volney American, PA-C  acetaminophen (TYLENOL) 650 MG CR tablet Take 1,300 mg by mouth 2 (two) times daily.    [provider]  aspirin 325 MG tablet Take 325 mg by mouth at bedtime.     [provider]  cephALEXin (KEFLEX) 500 MG capsule Take 1 capsule (500 mg total) by mouth 2 (two) times daily. 01/06/23   Volney American, PA-C  Coenzyme Q10 (COQ10) 200 MG CAPS Take 200 mg by mouth at bedtime.    [provider]  diclofenac Sodium (VOLTAREN) 1 % GEL Apply 1 Application topically 2 (two) times daily as needed (pain).    [provider]  Lidocaine, Anorectal, (RECTICARE EX) Apply 1 application  topically as needed (irritation).    [provider]  lisinopril-hydrochlorothiazide (ZESTORETIC) 20-25 MG tablet Take 1 tablet by mouth daily.    [provider]  oxybutynin (DITROPAN) 5 MG tablet Take 5 mg by mouth 2 (two) times daily.    [provider]  pantoprazole (PROTONIX) 40 MG tablet Take 1 tablet (40 mg total) by mouth 2 (two) times daily before a meal. 05/22/22 05/22/23  Eloise Harman, DO  ramipril (ALTACE) 2.5 MG capsule Take 2.5 mg by mouth daily.    [provider]  simvastatin (ZOCOR) 40 MG tablet Take 40 mg by mouth at bedtime.    [provider]  sulfamethoxazole-trimethoprim (BACTRIM DS) 800-160 MG tablet Take 1 tablet by mouth 2 (two) times  daily. 08/08/22   Volney American, PA-C    Family History Family History  Problem Relation Age of Onset   Congestive Heart Failure Mother    Diabetes Mother    Hypertension Mother    Diabetes Father    Osteoarthritis Father    Colon cancer Neg Hx     Social History Social History   Tobacco Use   Smoking status: Never   Smokeless tobacco: Never  Vaping Use   Vaping Use: Never used  Substance Use Topics   Alcohol use: No   Drug use: No     Allergies   Patient has no known allergies.   Review of Systems Review of Systems PER HPI  Physical Exam Triage Vital Signs ED Triage Vitals  Enc Vitals Group     BP 01/06/23 1010 113/67     Pulse Rate 01/06/23 1010 76     Resp 01/06/23 1010 20     Temp 01/06/23 1010 97.9 F (36.6 C)     Temp Source 01/06/23 1010 Oral     SpO2 01/06/23 1010 96 %     Weight --      Height --      Head Circumference --  Peak Flow --      Pain Score 01/06/23 1007 4     Pain Loc --      Pain Edu? --      Excl. in [place]? --    No data found.  Updated Vital Signs BP 113/67 (BP Location: Right Arm)   Pulse 76   Temp 97.9 F (36.6 C) (Oral)   Resp 20   SpO2 96%   Visual Acuity Right Eye Distance:   Left Eye Distance:   Bilateral Distance:    Right Eye Near:   Left Eye Near:    Bilateral Near:     Physical Exam Vitals and nursing note reviewed.  Constitutional:      Appearance: Normal appearance. She is not ill-appearing.  HENT:     Head: Atraumatic.  Eyes:     Extraocular Movements: Extraocular movements intact.     Conjunctiva/sclera: Conjunctivae normal.  Cardiovascular:     Rate and Rhythm: Normal rate and regular rhythm.     Heart sounds: Normal heart sounds.  Pulmonary:     Effort: Pulmonary effort is normal.     Breath sounds: Normal breath sounds.  Abdominal:     General: Bowel sounds are normal. There is no distension.     Palpations: Abdomen is soft.     Tenderness: There is no abdominal tenderness.  There is no right CVA tenderness, left CVA tenderness or guarding.  Musculoskeletal:        General: Normal range of motion.     Cervical back: Normal range of motion and neck supple.  Skin:    General: Skin is warm and dry.  Neurological:     Mental Status: She is alert and oriented to person, place, and time.  Psychiatric:        Mood and Affect: Mood normal.        Thought Content: Thought content normal.        Judgment: Judgment normal.      UC Treatments / Results  Labs (all labs ordered are listed, but only abnormal results are displayed) Labs Reviewed  POCT URINALYSIS DIP (MANUAL ENTRY) - Abnormal; Notable for the following components:      Result Value   Clarity, UA cloudy (*)    Blood, UA trace-intact (*)    Nitrite, UA Positive (*)    Leukocytes, UA Large (3+) (*)    All other components within normal limits  URINE CULTURE    EKG   Radiology No results found.  Procedures Procedures (including critical care time)  Medications Ordered in UC Medications - No data to display  Initial Impression / Assessment and Plan / UC Course  I have reviewed the triage vital signs and the nursing notes.  Pertinent labs & imaging results that were available during my care of the patient were reviewed by me and considered in my medical decision making (see chart for details).     Vitals and exam overall reassuring today, urinalysis with evidence of a urinary tract infection.  Urine culture pending, treat with Keflex, Pyridium, supportive over-the-counter medications and home care.  Return for worsening symptoms.  Final Clinical Impressions(s) / UC Diagnoses   Final diagnoses:  Acute lower UTI   Discharge Instructions   None    ED Prescriptions     Medication Sig Dispense Auth. Provider   cephALEXin (KEFLEX) 500 MG capsule Take 1 capsule (500 mg total) by mouth 2 (two) times daily. 10 capsule Volney American, [place]  phenazopyridine (PYRIDIUM) 200 MG tablet  Take 1 tablet (200 mg total) by mouth 3 (three) times daily as needed for pain. 6 tablet Volney American, [place]      PDMP not reviewed this encounter.   Volney American, [place] 01/06/23 1054

## 2023-01-06 NOTE — ED Triage Notes (Signed)
Pt reports urinary frequency, "cloudy" urine, nausea, fatigue x1 week. Denies any fever, abd pain.

## 2023-01-08 LAB — URINE CULTURE: Culture: >=100000 — AB

## 2023-01-14 NOTE — Progress Notes (Unsigned)
GI Office Note    Referring Provider: Carylon Perches, MD Primary Care Physician:  Carylon Perches, MD Primary Gastroenterologist: Hennie Duos. Marletta Lor, DO   Date:  01/15/2023  ID:  Lowell Bouton, DOB 05-22-1951, MRN [id number]  Chief Complaint   Chief Complaint  Patient presents with   Follow-up    Doing well, stomach bothers her at times.   History of Present Illness  [name] is a 72 y.o. female with a history of fatty liver, choledocholithiasis s/p ERCP in September 2021 with elevated alk phos, GERD, dysphagia improved after dilation in January 2021, intermittent diarrhea secondary to dietary intolerance and possibly IBS, hemorrhoids, adenomatous colon polyps presenting today for follow up.   Colonoscopy January 2022: non bleeding internal hemorrhoids, sigmoid and descending diverticulosis, 6mm tubular adenoma. Repeat in 5 years.    Seen in April for postprandial heaviness/bloating in RUQ. Only using omeprazole as needed previously but taking daily since her symptoms began. Gastritis suspected due to  NSAIDs. Reported decreased appetite and 8lb weight loss . Noted to have hernia above umbilicus or diastasis recti. CT if symptoms worsen, omeprazole daily, and update labs. Celiac screen negative. CBC and lipase unremarkable. CMP with elevated alk phos. Recommended to repeat LFTs, GTT, and AMA in 3 months.    CT A/P 04/30/22 with borderline cardiomegaly, evidence of cholecystectomy, common hepatic duct 10 mm, CBD 6mm, equivocal wall thickening in the CBD, pancreas and spleen unremarkable. Few scattered colonic diverticula. Lumbar foraminal impingement.    Labs 04/30/22: HFP wnl,  GGT 16, AMA normal.    MR/MRCP 05/02/22 with diffuse hepatic steatosis, common hepatic duct 11 mm and CBD 6mm with gentle tapering of the CBD without choledocholithiasis and no evidence of biliary ductal wall thickening or peribiliary enhancement, fatty pancreatic atrophy without ductal dilation. Scheduled for EGD to  further evaluate abdominal pain.    EGD 05/22/22: normal esophagus and duodenum. Gastritis s/p biopsy with minimal vascular ectasia without h. pylori. Advised omeprazole 40 mg BID for 3 months then daily and avoid NSAIDs.   Last office visit 08/04/22.  Some mild intermittent loose stools if eating spicy foods or eating too quickly.  Usually only about once per month.  Appetite at baseline.  Does admit to eating a fair amount of fried foods.  More abdominal pain or abdominal fullness.  Taking pantoprazole twice daily.  No breakthrough symptoms.  Trying to avoid NSAIDs, currently seeing chiropractor due to back pain.  Denied any melena, BRBPR, fever, constipation.  After 2 weeks.  Follow GERD diet.  Avoid NSAIDs.  Follow low-sodium diet..  Today: The last 2 weeks she's had terrible UTI. Tried keflex and Macrobid and believes she's had a yeast infection. Goes back to see Dr. Ouida Sills tomorrow. She states this is the 3rd UTI since the fall.   GERD: its okay. At times she feels like she can't breathe past her stomach. She sleeps in the recliner lots of times now but unable to get comfortable at times. Sleeps in the recliner since that is where her husband needs to sleep. Feels kinda tight. Does not have shortness of breath. Sometimes feels that way even when sitting in a normal chair. Taking pantoprazole 40 mg once daily. Stable appetite. Wants  more snacks vs big meals.   Night before easter she ate some fish and she got nausea and some terrible burps and gas with a sour taste. She states that in the middle of having a UTI as well.   Intermittent diarrhea: not  really having much prior to taking the antibiotics. Usually is okay if she does not eat fatty meals or fried foods or anything spicy.   No melena or brbpr.   A1c was high and will discuss with PCP.   Current Outpatient Medications  Medication Sig Dispense Refill   acetaminophen (TYLENOL) 650 MG CR tablet Take 1,300 mg by mouth 2 (two) times daily.      aspirin 325 MG tablet Take 325 mg by mouth at bedtime.      Coenzyme Q10 (COQ10) 200 MG CAPS Take 200 mg by mouth at bedtime.     diclofenac Sodium (VOLTAREN) 1 % GEL Apply 1 Application topically 2 (two) times daily as needed (pain).     Lidocaine, Anorectal, (RECTICARE EX) Apply 1 application  topically as needed (irritation).     lisinopril-hydrochlorothiazide (ZESTORETIC) 20-25 MG tablet Take 1 tablet by mouth daily.     nitrofurantoin, macrocrystal-monohydrate, (MACROBID) 100 MG capsule Take 100 mg by mouth 2 (two) times daily.     oxybutynin (DITROPAN) 5 MG tablet Take 5 mg by mouth 2 (two) times daily.     pantoprazole (PROTONIX) 40 MG tablet Take 1 tablet (40 mg total) by mouth 2 (two) times daily before a meal. 60 tablet 11   simvastatin (ZOCOR) 40 MG tablet Take 40 mg by mouth at bedtime.     No current facility-administered medications for this visit.    Past Medical History:  Diagnosis Date   Gastritis    GERD (gastroesophageal reflux disease)    HTN (hypertension)    Hyperlipidemia    TIA (transient ischemic attack) 2004   no residual    Past Surgical History:  Procedure Laterality Date   BIOPSY  10/28/2019   Procedure: BIOPSY;  Surgeon: West BaliFields, Sandi L, MD;  Location: [facility];  Service: Endoscopy;;  gastric    BIOPSY  05/22/2022   Procedure: BIOPSY;  Surgeon: Lanelle Balarver, Charles K, DO;  Location: [facility];  Service: Endoscopy;;   BREAST REDUCTION SURGERY     CHOLECYSTECTOMY N/A 02/29/2016   Procedure: LAPAROSCOPIC CHOLECYSTECTOMY;  Surgeon: Franky MachoMark Jenkins, MD;  Location: [facility];  Service: General;  Laterality: N/A;   COLONOSCOPY  04/13/2006   AVW:[id number]:Normal colon without evidence of polyps, masses, inflammatory changes diverticular or vascular ectasia/Normal retroflex view of the rectum   COLONOSCOPY WITH PROPOFOL N/A 10/09/2020   Surgeon: Lanelle Balarver, Charles K, DO; nonbleeding internal hemorrhoids, diverticulosis in the sigmoid and descending colon, 6 mm tubular  adenoma.  Recommended repeat in 5 years.   ENDOSCOPIC RETROGRADE CHOLANGIOPANCREATOGRAPHY (ERCP) WITH PROPOFOL N/A 06/14/2020   Procedure: ENDOSCOPIC RETROGRADE CHOLANGIOPANCREATOGRAPHY (ERCP) WITH PROPOFOL;  Surgeon: Rachael FeeJacobs, Daniel P, MD;  Location: [facility];  Service: Endoscopy;  Laterality: N/A;   ESOPHAGOGASTRODUODENOSCOPY  2011   peptic stricture s/p dialtion, miuld gastritis likely NSAID related. No H. pylori   ESOPHAGOGASTRODUODENOSCOPY N/A 10/28/2019   Procedure: ESOPHAGOGASTRODUODENOSCOPY (EGD);  Surgeon: West BaliFields, Sandi L, MD; Normal-appearing esophagus s/p empiric dilation, single gastric polyp resected and retrieved, mild gastritis s/p biopsy.  Pathology with fundic gland polyp.   ESOPHAGOGASTRODUODENOSCOPY (EGD) WITH PROPOFOL N/A 05/22/2022   Procedure: ESOPHAGOGASTRODUODENOSCOPY (EGD) WITH PROPOFOL;  Surgeon: Lanelle Balarver, Charles K, DO;  Location: [facility];  Service: Endoscopy;  Laterality: N/A;  11:15 am   JOINT REPLACEMENT     POLYPECTOMY  10/09/2020   Procedure: POLYPECTOMY;  Surgeon: Lanelle Balarver, Charles K, DO;  Location: [facility];  Service: Endoscopy;;   REPLACEMENT TOTAL KNEE Bilateral    SAVORY DILATION N/A  10/28/2019   Procedure: SAVORY DILATION;  Surgeon: West Bali, MD;  Location: [facility];  Service: Endoscopy;  Laterality: N/A;   SHOULDER ARTHROSCOPY WITH ROTATOR CUFF REPAIR AND SUBACROMIAL DECOMPRESSION Left 05/05/2019   Procedure: Left Shoulder Arthroscopy and Rotator Cuff Repair ACROMIOPLASTY;  Surgeon: Marcene Corning, MD;  Location: [facility];  Service: Orthopedics;  Laterality: Left;   SPHINCTEROTOMY  06/14/2020   Procedure: SPHINCTEROTOMY;  Surgeon: Rachael Fee, MD;  Location: [facility];  Service: Endoscopy;;  balloon sweep    Family History  Problem Relation Age of Onset   Congestive Heart Failure Mother    Diabetes Mother    Hypertension Mother    Diabetes Father    Osteoarthritis Father    Colon cancer Neg Hx      Allergies as of 01/15/2023   (No Known Allergies)    Social History   Socioeconomic History   Marital status: Married    Spouse name: Not on file   Number of children: Not on file   Years of education: Not on file   Highest education level: Not on file  Occupational History   Occupation: [employer]    Employer: [employer]  Tobacco Use   Smoking status: Never   Smokeless tobacco: Never  Vaping Use   Vaping Use: Never used  Substance and Sexual Activity   Alcohol use: No   Drug use: No   Sexual activity: Not Currently  Other Topics Concern   Not on file  Social History Narrative   Not on file   Social Determinants of Health   Financial Resource Strain: Not on file  Food Insecurity: Not on file  Transportation Needs: Not on file  Physical Activity: Not on file  Stress: Not on file  Social Connections: Not on file     Review of Systems   Gen: Denies fever, chills, anorexia. Denies fatigue, weakness, weight loss.  CV: Denies chest pain, palpitations, syncope, peripheral edema, and claudication. Resp: Denies dyspnea at rest, cough, wheezing, coughing up blood, and pleurisy. GI: See HPI Derm: Denies rash, itching, dry skin Psych: Denies depression, anxiety, memory loss, confusion. No homicidal or suicidal ideation.  Heme: Denies bruising, bleeding, and enlarged lymph nodes.   Physical Exam   BP 95/60 (BP Location: Right Arm, Patient Position: Sitting, Cuff Size: Large)   Pulse 85   Temp (!) 97.5 F (36.4 C) (Oral)   Ht 5\' 9"  (1.753 m)   Wt 264 lb 3.2 oz (119.8 kg)   SpO2 96%   BMI 39.02 kg/m   General:   Alert and oriented. No distress noted. Pleasant and cooperative.  Head:  Normocephalic and atraumatic. Eyes:  Conjuctiva clear without scleral icterus. Mouth:  Oral mucosa pink and moist. Good dentition. No lesions. Lungs:  Clear to auscultation bilaterally. No wheezes, rales, or rhonchi. No distress.  Heart:  S1, S2  present without murmurs appreciated.  Abdomen:  +BS, soft, non-tender and non-distended. No rebound or guarding. No HSM or masses noted. Rectal: deferred Msk:  Symmetrical without gross deformities. Normal posture. Extremities:  Without edema. Neurologic:  Alert and  oriented x4 Psych:  Alert and cooperative. Normal mood and affect.   Assessment  [name] is a 72 y.o. female with a history of fatty liver, choledocholithiasis s/p ERCP in September 2021 with elevated alk phos, GERD, dysphagia improved after dilation in January 2021, intermittent diarrhea secondary to dietary intolerance and possibly IBS, hemorrhoids, adenomatous colon polyps presenting  today for follow up.   GERD: Intermittent epigastric discomfort.  Fairly well-maintained on pantoprazole 40 mg once daily.  In the evenings when she lays down there is some discomfort or fullness sensation to her abdomen that is bothersome.  Could be secondary to reflux/gastritis.  Infrequent use of NSAIDs.  It appears fish and other, reflux foods are triggers and can cause some nausea at times.  Advise continue to avoid all of these use famotidine nightly in addition to her pantoprazole if needed.  Advised if symptoms become more frequent then to follow-up in the office.  She has a baseline very small soft reducible umbilical hernia that is not likely causing her symptoms.  She denies any overt shortness of breath.  Appetite is stable.  No unintentional weight loss.  Intermittent Diarrhea: Rare intermittent diarrhea related to fatty, spicy, or fried foods typically.  Recently having a slight bit of extra loose stools but she has been on multiple antibiotics for recurrent UTIs.  Mild hypotension today but asymptomatic. Advised to monitor for symptoms and discuss with PCP if it remains low. Encouraged increased water intake.   PLAN   Continue pantoprazole 40 mg once daily Famotidine nightly if needed.  GERD diet, low-fat diet. Avoid refined  sugars and high fat meals.  Monitor blood pressure regularly, contact PCP if blood pressure remains low Follow up in 6 months.     Brooke Bonito, MSN, FNP-BC, AGACNP-BC [facility]

## 2023-01-15 ENCOUNTER — Encounter: Payer: Self-pay | Admitting: Gastroenterology

## 2023-01-15 ENCOUNTER — Ambulatory Visit: Payer: Medicare HMO | Admitting: Gastroenterology

## 2023-01-15 VITALS — BP 95/60 | HR 85 | Temp 97.5°F | Ht 69.0 in | Wt 264.2 lb

## 2023-01-15 DIAGNOSIS — I959 Hypotension, unspecified: Secondary | ICD-10-CM | POA: Diagnosis not present

## 2023-01-15 DIAGNOSIS — R1013 Epigastric pain: Secondary | ICD-10-CM

## 2023-01-15 DIAGNOSIS — R197 Diarrhea, unspecified: Secondary | ICD-10-CM

## 2023-01-15 DIAGNOSIS — K219 Gastro-esophageal reflux disease without esophagitis: Secondary | ICD-10-CM

## 2023-01-15 NOTE — Patient Instructions (Addendum)
Continue pantoprazole 40 mg once daily.  Nightly you may use famotidine 10 to 20 mg as needed prior to laying down in the recliner.  Follow a GERD diet:  Avoid fried, fatty, greasy, spicy, citrus foods. Avoid caffeine and carbonated beverages. Avoid chocolate. Try eating 4-6 small meals a day rather than 3 large meals. Do not eat within 3 hours of laying down. Prop head of bed up on wood or bricks to create a 6 inch incline.   Try monitoring your blood pressure at home, if your systolic number (top number) remains less than 100 you may want to discuss this with your primary care provider to make an adjustment in your blood pressure medication.  Please ensure you are getting adequate amount of water intake daily, at least 3-4 bottles of water daily.  Hydration may also help you with your frequent UTIs.  It was a pleasure to see you today. I want to create trusting relationships with patients. If you receive a survey regarding your visit,  I greatly appreciate you taking time to fill this out on paper or through your MyChart. I value your feedback.  Brooke Bonito, MSN, FNP-BC, AGACNP-BC [facility]

## 2023-04-20 ENCOUNTER — Ambulatory Visit
Admission: EM | Admit: 2023-04-20 | Discharge: 2023-04-20 | Disposition: A | Discharge status: Home / Self Care | Payer: Medicare HMO | Attending: Nurse Practitioner | Admitting: Nurse Practitioner

## 2023-04-20 ENCOUNTER — Telehealth: Payer: Self-pay

## 2023-04-20 DIAGNOSIS — Z23 Encounter for immunization: Secondary | ICD-10-CM

## 2023-04-20 DIAGNOSIS — T25211A Burn of second degree of right ankle, initial encounter: Secondary | ICD-10-CM | POA: Diagnosis not present

## 2023-04-20 MED ORDER — DOXYCYCLINE HYCLATE 100 MG PO TABS: 100.0000 mg | ORAL_TABLET | Freq: Two times a day (BID) | ORAL | 0 refills | Status: DC

## 2023-04-20 MED ORDER — DOXYCYCLINE HYCLATE 100 MG PO TABS: 100.0000 mg | ORAL_TABLET | Freq: Two times a day (BID) | ORAL | 0 refills | Status: AC

## 2023-04-20 MED ORDER — TETANUS-DIPHTH-ACELL PERTUSSIS 5-2.5-18.5 LF-MCG/0.5 IM SUSY: 0.5000 mL | PREFILLED_SYRINGE | Freq: Once | INTRAMUSCULAR | Status: DC

## 2023-04-20 NOTE — Discharge Instructions (Addendum)
Your most recent tetanus shot was given on 05/16/2022. I would like for you to follow-up in 48 hours for a wound check.  You can schedule an appointment or you can walk-in. Take medication as prescribed. May take over-the-counter Tylenol as needed for pain, fever, general discomfort. Continue current wound care to include use of Silvadene cream, and topical antibiotic ointment. May apply ice to the wound to help with pain or swelling. Elevate the right leg to help with swelling. If you experience fever, chills, foul-smelling drainage from the site, or other concerns, please go to the emergency department immediately for further evaluation. If the area is delayed in healing, or you are having difficulty with resolution of the wound, we may provide a referral to wound care if necessary.   Follow-up as needed.

## 2023-04-20 NOTE — ED Triage Notes (Signed)
Pt presents with burn to her right ankle that happened 5 days ago. Has been treating it at home but now notices some redness spreading around wound. Putting Vaseline gauze and silver sulfide cream and neosporin cream.

## 2023-04-20 NOTE — ED Provider Notes (Addendum)
[facility]    CSN: [id number] Arrival date & time: 04/20/23  1610      History   Chief Complaint Chief Complaint  Patient presents with   Burn    HPI [name] is a 72 y.o. female.   The history is provided by the patient.   The patient presents for complaints of burn to the right ankle that occurred approximately 5 days ago.  Patient states that she has been treating the burn at home, but noticed increased redness to the burn along with pain that started today.  Patient states she did have a blister to the burn that "popped" today.  Patient states she has been using Vaseline gauze, Silvadene cream, Neosporin, and calamine zinc wraps to the area.  Patient denies fever, chills, chest pain, abdominal pain, nausea, vomiting, or diarrhea.  Patient states that she is unsure of when she had her last tetanus shot. Review of the chart shows last tetanus was 05/16/22.  Past Medical History:  Diagnosis Date   Gastritis    GERD (gastroesophageal reflux disease)    HTN (hypertension)    Hyperlipidemia    TIA (transient ischemic attack) 2004   no residual    Patient Active Problem List   Diagnosis Date Noted   RUQ pain 01/27/2022   Bloating 01/27/2022   Poor appetite 01/27/2022   Abdominal wall defect, acquired 01/27/2022   Rectal itching 08/24/2020   Colon cancer screening 08/24/2020   Diarrhea 02/22/2020   Abdominal pain, epigastric 07/29/2019   Elevated alkaline phosphatase level 07/29/2019   Acute cholecystitis 02/27/2016   DOE (dyspnea on exertion) 12/12/2013   Stiffness of joint, lower leg 09/10/2011   Bilateral leg weakness 09/10/2011   S/P TKR (total knee replacement) 08/10/2011   Hypertension 08/10/2011   Dyslipidemia 08/10/2011   Gastroesophageal reflux disease 08/10/2011   UTI (lower urinary tract infection) 08/10/2011   OTHER DYSPHAGIA 05/21/2010   CEREBROVASCULAR ACCIDENT, HX OF 05/17/2010    Past Surgical History:  Procedure Laterality Date    BIOPSY  10/28/2019   Procedure: BIOPSY;  Surgeon: West Bali, MD;  Location: [facility];  Service: Endoscopy;;  gastric    BIOPSY  05/22/2022   Procedure: BIOPSY;  Surgeon: Lanelle Bal, DO;  Location: [facility];  Service: Endoscopy;;   BREAST REDUCTION SURGERY     CHOLECYSTECTOMY N/A 02/29/2016   Procedure: LAPAROSCOPIC CHOLECYSTECTOMY;  Surgeon: Franky Macho, MD;  Location: [facility];  Service: General;  Laterality: N/A;   COLONOSCOPY  04/13/2006   [id number] colon without evidence of polyps, masses, inflammatory changes diverticular or vascular ectasia/Normal retroflex view of the rectum   COLONOSCOPY WITH PROPOFOL N/A 10/09/2020   Surgeon: Lanelle Bal, DO; nonbleeding internal hemorrhoids, diverticulosis in the sigmoid and descending colon, 6 mm tubular adenoma.  Recommended repeat in 5 years.   ENDOSCOPIC RETROGRADE CHOLANGIOPANCREATOGRAPHY (ERCP) WITH PROPOFOL N/A 06/14/2020   Procedure: ENDOSCOPIC RETROGRADE CHOLANGIOPANCREATOGRAPHY (ERCP) WITH PROPOFOL;  Surgeon: Rachael Fee, MD;  Location: [facility];  Service: Endoscopy;  Laterality: N/A;   ESOPHAGOGASTRODUODENOSCOPY  2011   peptic stricture s/p dialtion, miuld gastritis likely NSAID related. No H. pylori   ESOPHAGOGASTRODUODENOSCOPY N/A 10/28/2019   Procedure: ESOPHAGOGASTRODUODENOSCOPY (EGD);  Surgeon: West Bali, MD; Normal-appearing esophagus s/p empiric dilation, single gastric polyp resected and retrieved, mild gastritis s/p biopsy.  Pathology with fundic gland polyp.   ESOPHAGOGASTRODUODENOSCOPY (EGD) WITH PROPOFOL N/A 05/22/2022   Procedure: ESOPHAGOGASTRODUODENOSCOPY (EGD) WITH PROPOFOL;  Surgeon: Lanelle Bal, DO;  [facility]: [facility];  Service: Endoscopy;  Laterality: N/A;  11:15 am   JOINT REPLACEMENT     POLYPECTOMY  10/09/2020   Procedure: POLYPECTOMY;  Surgeon: Lanelle Bal, DO;  [facility]: [facility];  Service: Endoscopy;;   REPLACEMENT TOTAL KNEE Bilateral    SAVORY  DILATION N/A 10/28/2019   Procedure: SAVORY DILATION;  Surgeon: West Bali, MD;  [facility]: [facility];  Service: Endoscopy;  Laterality: N/A;   SHOULDER ARTHROSCOPY WITH ROTATOR CUFF REPAIR AND SUBACROMIAL DECOMPRESSION Left 05/05/2019   Procedure: Left Shoulder Arthroscopy and Rotator Cuff Repair ACROMIOPLASTY;  Surgeon: Marcene Corning, MD;  Location: [facility];  Service: Orthopedics;  Laterality: Left;   SPHINCTEROTOMY  06/14/2020   Procedure: SPHINCTEROTOMY;  Surgeon: Rachael Fee, MD;  Location: [facility];  Service: Endoscopy;;  balloon sweep    OB History   No obstetric history on file.      Home Medications    Prior to Admission medications   Medication Sig Start Date End Date Taking? Authorizing Provider  aspirin 325 MG tablet Take 325 mg by mouth at bedtime.    Yes [provider]  Coenzyme Q10 (COQ10) 200 MG CAPS Take 200 mg by mouth at bedtime.   Yes [provider]  diclofenac Sodium (VOLTAREN) 1 % GEL Apply 1 Application topically 2 (two) times daily as needed (pain).   Yes [provider]  doxycycline (VIBRA-TABS) 100 MG tablet Take 1 tablet (100 mg total) by mouth 2 (two) times daily for 5 days. 04/20/23 04/25/23 Yes Taegan Standage-Warren, Sadie Haber, NP  Lidocaine, Anorectal, (RECTICARE EX) Apply 1 application  topically as needed (irritation).   Yes [provider]  lisinopril-hydrochlorothiazide (ZESTORETIC) 20-25 MG tablet Take 1 tablet by mouth daily.   Yes [provider]  oxybutynin (DITROPAN) 5 MG tablet Take 5 mg by mouth 2 (two) times daily.   Yes [provider]  pantoprazole (PROTONIX) 40 MG tablet Take 1 tablet (40 mg total) by mouth 2 (two) times daily before a meal. 05/22/22 05/22/23 Yes Carver, Hennie Duos, DO  simvastatin (ZOCOR) 40 MG tablet Take 40 mg by mouth at bedtime.   Yes [provider]  acetaminophen (TYLENOL) 650 MG CR tablet Take 1,300 mg by mouth 2 (two) times daily.     [provider]  nitrofurantoin, macrocrystal-monohydrate, (MACROBID) 100 MG capsule Take 100 mg by mouth 2 (two) times daily. 01/12/23   [provider]    Family History Family History  Problem Relation Age of Onset   Congestive Heart Failure Mother    Diabetes Mother    Hypertension Mother    Diabetes Father    Osteoarthritis Father    Colon cancer Neg Hx     Social History Social History   Tobacco Use   Smoking status: Never   Smokeless tobacco: Never  Vaping Use   Vaping status: Never Used  Substance Use Topics   Alcohol use: No   Drug use: No     Allergies   Patient has no known allergies.   Review of Systems Review of Systems Per HPI  Physical Exam Triage Vital Signs ED Triage Vitals  Encounter Vitals Group     BP 04/20/23 1635 132/75     Systolic BP Percentile --      Diastolic BP Percentile --      Pulse Rate 04/20/23 1635 91     Resp 04/20/23 1635 18     Temp 04/20/23 1635 98.3 F (  36.8 C)     Temp Source 04/20/23 1635 Oral     SpO2 04/20/23 1635 95 %     Weight --      Height --      Head Circumference --      Peak Flow --      Pain Score 04/20/23 1636 6     Pain Loc --      Pain Education --      Exclude from Growth Chart --    No data found.  Updated Vital Signs BP 132/75 (BP Location: Right Arm)   Pulse 91   Temp 98.3 F (36.8 C) (Oral)   Resp 18   SpO2 95%   Visual Acuity Right Eye Distance:   Left Eye Distance:   Bilateral Distance:    Right Eye Near:   Left Eye Near:    Bilateral Near:     Physical Exam Vitals and nursing note reviewed.  Constitutional:      General: She is not in acute distress.    Appearance: Normal appearance.  HENT:     Head: Normocephalic.  Eyes:     Extraocular Movements: Extraocular movements intact.     Pupils: Pupils are equal, round, and reactive to light.  Skin:    General: Skin is warm and dry.     Findings: Burn present.     Comments: Second-degree burn noted to  the right ankle.  See attached images  Neurological:     General: No focal deficit present.     Mental Status: She is alert and oriented to person, place, and time.  Psychiatric:        Mood and Affect: Mood normal.        Behavior: Behavior normal.               UC Treatments / Results  Labs (all labs ordered are listed, but only abnormal results are displayed) Labs Reviewed - No data to display  EKG   Radiology No results found.  Procedures Procedures (including critical care time)  Medications Ordered in UC Medications  Tdap (BOOSTRIX) injection 0.5 mL (has no administration in time range)    Initial Impression / Assessment and Plan / UC Course  I have reviewed the triage vital signs and the nursing notes.  Pertinent labs & imaging results that were available during my care of the patient were reviewed by me and considered in my medical decision making (see chart for details).  The patient is well-appearing, she is in no acute distress, vital signs are stable.  Second-degree burn noted to the right ankle.  Patient's most recent tetanus shot was 05/16/2022 after review of chart.  Will start patient on doxycycline 100 mg twice daily for the next 5 days.  Patient advised to continue current wound care to include use of Silvadene cream, and topical antibiotic ointment.  Supportive care recommendations were provided to the patient to include over-the-counter analgesics such as Tylenol for pain or discomfort, the application of ice to help with pain and swelling, and elevating the right lower extremity to help with swelling.  Patient was advised to follow-up in this clinic for wound check within the next 48 hours.  If wound is delayed in healing, or other concerns, will consider referral to wound care at that time. Patient was in agreement with this plan of care and verbalizes understanding.  All questions were answered.  Patient stable for discharge.   Final Clinical  Impressions(s) / UC  Diagnoses   Final diagnoses:  Partial thickness burn of right ankle, initial encounter  Need for vaccination     Discharge Instructions      Your most recent tetanus shot was given on 05/16/2022. I would like for you to follow-up in 48 hours for a wound check.  You can schedule an appointment or you can walk-in. Take medication as prescribed. May take over-the-counter Tylenol as needed for pain, fever, general discomfort. Continue current wound care to include use of Silvadene cream, and topical antibiotic ointment. May apply ice to the wound to help with pain or swelling. Elevate the right leg to help with swelling. If you experience fever, chills, foul-smelling drainage from the site, or other concerns, please go to the emergency department immediately for further evaluation. If the area is delayed in healing, or you are having difficulty with resolution of the wound, we may provide a referral to wound care if necessary.   Follow-up as needed.     ED Prescriptions     Medication Sig Dispense Auth. Provider   doxycycline (VIBRA-TABS) 100 MG tablet Take 1 tablet (100 mg total) by mouth 2 (two) times daily for 5 days. 10 tablet Anselm Aumiller-Warren, Sadie Haber, NP      PDMP not reviewed this encounter.   Abran Cantor, NP 04/20/23 1733    Abran Cantor, NP 04/20/23 1743

## 2023-04-23 ENCOUNTER — Ambulatory Visit
Admission: RE | Admit: 2023-04-23 | Discharge: 2023-04-23 | Disposition: A | Discharge status: Home / Self Care | Payer: Medicare HMO | Source: Ambulatory Visit | Attending: Family Medicine | Admitting: Family Medicine

## 2023-04-23 VITALS — BP 109/71 | HR 62 | Temp 97.6°F | Resp 20

## 2023-04-23 DIAGNOSIS — T24201D Burn of second degree of unspecified site of right lower limb, except ankle and foot, subsequent encounter: Secondary | ICD-10-CM | POA: Diagnosis not present

## 2023-04-23 MED ORDER — SILVER SULFADIAZINE 1 % EX CREA: 1.0000 | TOPICAL_CREAM | Freq: Every day | CUTANEOUS | 0 refills | Status: DC

## 2023-04-23 NOTE — ED Triage Notes (Signed)
Pt reports to UC for a recheck for her right leg burn x 8 days ago.    Pt reports her dog stepped on her foot and popped open her wound. States things are oozing out

## 2023-04-27 NOTE — ED Provider Notes (Signed)
[facility]    CSN: [id number] Arrival date & time: 04/23/23  1557      History   Chief Complaint Chief Complaint  Patient presents with   Burn    Recheck from Monday - Entered by patient    HPI [name] is a 72 y.o. female.   Patient presenting today with following up on a burn to the right anterior lower leg that occurred 8 days ago.  Was seen several days ago at this clinic and given doxycycline and Silvadene cream.  Has been cleaning the area at least once daily, applying Vaseline gauze and Silvadene and taking her antibiotic regularly.  States the area does seem to be looking better but still having serous drainage from the area and swelling to the area.  Denies fever, chills, numbness, tingling, weakness.  States that her dog accidentally stepped on the area yesterday and opened up some skin.    Past Medical History:  Diagnosis Date   Gastritis    GERD (gastroesophageal reflux disease)    HTN (hypertension)    Hyperlipidemia    TIA (transient ischemic attack) 2004   no residual    Patient Active Problem List   Diagnosis Date Noted   RUQ pain 01/27/2022   Bloating 01/27/2022   Poor appetite 01/27/2022   Abdominal wall defect, acquired 01/27/2022   Rectal itching 08/24/2020   Colon cancer screening 08/24/2020   Diarrhea 02/22/2020   Abdominal pain, epigastric 07/29/2019   Elevated alkaline phosphatase level 07/29/2019   Acute cholecystitis 02/27/2016   DOE (dyspnea on exertion) 12/12/2013   Stiffness of joint, lower leg 09/10/2011   Bilateral leg weakness 09/10/2011   S/P TKR (total knee replacement) 08/10/2011   Hypertension 08/10/2011   Dyslipidemia 08/10/2011   Gastroesophageal reflux disease 08/10/2011   UTI (lower urinary tract infection) 08/10/2011   OTHER DYSPHAGIA 05/21/2010   CEREBROVASCULAR ACCIDENT, HX OF 05/17/2010    Past Surgical History:  Procedure Laterality Date   BIOPSY  10/28/2019   Procedure: BIOPSY;  Surgeon:  West Bali, MD;  [facility]: [facility];  Service: Endoscopy;;  gastric    BIOPSY  05/22/2022   Procedure: BIOPSY;  Surgeon: Lanelle Bal, DO;  Location: [facility];  Service: Endoscopy;;   BREAST REDUCTION SURGERY     CHOLECYSTECTOMY N/A 02/29/2016   Procedure: LAPAROSCOPIC CHOLECYSTECTOMY;  Surgeon: Franky Macho, MD;  Location: [facility];  Service: General;  Laterality: N/A;   COLONOSCOPY  04/13/2006   ONG:EXBMWU colon without evidence of polyps, masses, inflammatory changes diverticular or vascular ectasia/Normal retroflex view of the rectum   COLONOSCOPY WITH PROPOFOL N/A 10/09/2020   Surgeon: Lanelle Bal, DO; nonbleeding internal hemorrhoids, diverticulosis in the sigmoid and descending colon, 6 mm tubular adenoma.  Recommended repeat in 5 years.   ENDOSCOPIC RETROGRADE CHOLANGIOPANCREATOGRAPHY (ERCP) WITH PROPOFOL N/A 06/14/2020   Procedure: ENDOSCOPIC RETROGRADE CHOLANGIOPANCREATOGRAPHY (ERCP) WITH PROPOFOL;  Surgeon: Rachael Fee, MD;  Location: [facility];  Service: Endoscopy;  Laterality: N/A;   ESOPHAGOGASTRODUODENOSCOPY  2011   peptic stricture s/p dialtion, miuld gastritis likely NSAID related. No H. pylori   ESOPHAGOGASTRODUODENOSCOPY N/A 10/28/2019   Procedure: ESOPHAGOGASTRODUODENOSCOPY (EGD);  Surgeon: West Bali, MD; Normal-appearing esophagus s/p empiric dilation, single gastric polyp resected and retrieved, mild gastritis s/p biopsy.  Pathology with fundic gland polyp.   ESOPHAGOGASTRODUODENOSCOPY (EGD) WITH PROPOFOL N/A 05/22/2022   Procedure: ESOPHAGOGASTRODUODENOSCOPY (EGD) WITH PROPOFOL;  Surgeon: Lanelle Bal, DO;  [facility];  Service: Endoscopy;  Laterality: N/A;  11:15 am   JOINT REPLACEMENT     POLYPECTOMY  10/09/2020   Procedure: POLYPECTOMY;  Surgeon: Lanelle Bal, DO;  [facility]: [facility];  Service: Endoscopy;;   REPLACEMENT TOTAL KNEE Bilateral    SAVORY DILATION N/A 10/28/2019   Procedure: SAVORY DILATION;   Surgeon: West Bali, MD;  [facility]: [facility];  Service: Endoscopy;  Laterality: N/A;   SHOULDER ARTHROSCOPY WITH ROTATOR CUFF REPAIR AND SUBACROMIAL DECOMPRESSION Left 05/05/2019   Procedure: Left Shoulder Arthroscopy and Rotator Cuff Repair ACROMIOPLASTY;  Surgeon: Marcene Corning, MD;  Location: [facility];  Service: Orthopedics;  Laterality: Left;   SPHINCTEROTOMY  06/14/2020   Procedure: SPHINCTEROTOMY;  Surgeon: Rachael Fee, MD;  Location: [facility];  Service: Endoscopy;;  balloon sweep    OB History   No obstetric history on file.      Home Medications    Prior to Admission medications   Medication Sig Start Date End Date Taking? Authorizing Provider  silver sulfADIAZINE (SILVADENE) 1 % cream Apply 1 Application topically daily. 04/23/23  Yes Particia Nearing, PA-C  acetaminophen (TYLENOL) 650 MG CR tablet Take 1,300 mg by mouth 2 (two) times daily.    [provider]  aspirin 325 MG tablet Take 325 mg by mouth at bedtime.     [provider]  Coenzyme Q10 (COQ10) 200 MG CAPS Take 200 mg by mouth at bedtime.    [provider]  diclofenac Sodium (VOLTAREN) 1 % GEL Apply 1 Application topically 2 (two) times daily as needed (pain).    [provider]  Lidocaine, Anorectal, (RECTICARE EX) Apply 1 application  topically as needed (irritation).    [provider]  lisinopril-hydrochlorothiazide (ZESTORETIC) 20-25 MG tablet Take 1 tablet by mouth daily.    [provider]  nitrofurantoin, macrocrystal-monohydrate, (MACROBID) 100 MG capsule Take 100 mg by mouth 2 (two) times daily. 01/12/23   [provider]  oxybutynin (DITROPAN) 5 MG tablet Take 5 mg by mouth 2 (two) times daily.    [provider]  pantoprazole (PROTONIX) 40 MG tablet Take 1 tablet (40 mg total) by mouth 2 (two) times daily before a meal. 05/22/22 05/22/23  Lanelle Bal, DO  simvastatin (ZOCOR) 40 MG tablet  Take 40 mg by mouth at bedtime.    [provider]    Family History Family History  Problem Relation Age of Onset   Congestive Heart Failure Mother    Diabetes Mother    Hypertension Mother    Diabetes Father    Osteoarthritis Father    Colon cancer Neg Hx     Social History Social History   Tobacco Use   Smoking status: Never   Smokeless tobacco: Never  Vaping Use   Vaping status: Never Used  Substance Use Topics   Alcohol use: No   Drug use: No     Allergies   Patient has no known allergies.   Review of Systems Review of Systems Per HPI  Physical Exam Triage Vital Signs ED Triage Vitals  Encounter Vitals Group     BP 04/23/23 1628 109/71     Systolic BP Percentile --      Diastolic BP Percentile --      Pulse Rate 04/23/23 1628 62     Resp 04/23/23 1628 20     Temp 04/23/23 1628 97.6 F (36.4 C)     Temp Source 04/23/23 1628 Oral     SpO2 04/23/23  1628 94 %     Weight --      Height --      Head Circumference --      Peak Flow --      Pain Score 04/23/23 1629 3     Pain Loc --      Pain Education --      Exclude from Growth Chart --    No data found.  Updated Vital Signs BP 109/71 (BP Location: Right Arm)   Pulse 62   Temp 97.6 F (36.4 C) (Oral)   Resp 20   SpO2 94%   Visual Acuity Right Eye Distance:   Left Eye Distance:   Bilateral Distance:    Right Eye Near:   Left Eye Near:    Bilateral Near:     Physical Exam Vitals and nursing note reviewed.  Constitutional:      Appearance: Normal appearance. She is not ill-appearing.  HENT:     Head: Atraumatic.  Eyes:     Extraocular Movements: Extraocular movements intact.     Conjunctiva/sclera: Conjunctivae normal.  Cardiovascular:     Rate and Rhythm: Normal rate and regular rhythm.     Heart sounds: Normal heart sounds.  Pulmonary:     Effort: Pulmonary effort is normal.     Breath sounds: Normal breath sounds.  Musculoskeletal:        General: Normal range of  motion.     Cervical back: Normal range of motion and neck supple.  Skin:    General: Skin is warm.     Comments: Distal aspect of right anterior lower leg healing fairly well from second-degree burn, still with a hyperpigmented thickened region as well as an area that has opened up from her dog stepping on it.  No signs of infection today, no surrounding edema, erythema, thick drainage  Neurological:     Mental Status: She is alert and oriented to person, place, and time.     Comments: Bilateral lower extremities neurovascularly intact  Psychiatric:        Mood and Affect: Mood normal.        Thought Content: Thought content normal.        Judgment: Judgment normal.      UC Treatments / Results  Labs (all labs ordered are listed, but only abnormal results are displayed) Labs Reviewed - No data to display  EKG   Radiology No results found.  Procedures Procedures (including critical care time)  Medications Ordered in UC Medications - No data to display  Initial Impression / Assessment and Plan / UC Course  I have reviewed the triage vital signs and the nursing notes.  Pertinent labs & imaging results that were available during my care of the patient were reviewed by me and considered in my medical decision making (see chart for details).     Vitals and exam overall reassuring today, already on antibiotics so complete this course.  No signs of infection at this time.  Healing as expected.  Wound care performed in clinic, refilled Silvadene cream, complete doxycycline and follow-up if worsening at any time.  Final Clinical Impressions(s) / UC Diagnoses   Final diagnoses:  Partial thickness burn of right lower extremity, subsequent encounter   Discharge Instructions   None    ED Prescriptions     Medication Sig Dispense Auth. Provider   silver sulfADIAZINE (SILVADENE) 1 % cream Apply 1 Application topically daily. 400 g Particia Nearing, [place]  PDMP not  reviewed this encounter.   Roosvelt Maser [place], [place] 04/27/23 [phone]

## 2023-06-25 ENCOUNTER — Encounter: Payer: Self-pay | Admitting: Gastroenterology

## 2023-08-13 ENCOUNTER — Ambulatory Visit: Payer: Medicare HMO | Admitting: Gastroenterology

## 2023-08-13 ENCOUNTER — Encounter: Payer: Self-pay | Admitting: Gastroenterology

## 2023-08-13 VITALS — BP 91/59 | HR 67 | Temp 97.6°F | Ht 69.0 in | Wt 264.8 lb

## 2023-08-13 DIAGNOSIS — I959 Hypotension, unspecified: Secondary | ICD-10-CM

## 2023-08-13 DIAGNOSIS — R1310 Dysphagia, unspecified: Secondary | ICD-10-CM

## 2023-08-13 DIAGNOSIS — K219 Gastro-esophageal reflux disease without esophagitis: Secondary | ICD-10-CM | POA: Diagnosis not present

## 2023-08-13 DIAGNOSIS — R197 Diarrhea, unspecified: Secondary | ICD-10-CM
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
## 2023-08-13 NOTE — Patient Instructions (Addendum)
Continue pantoprazole 40 mg once daily.   Follow a GERD diet:  Avoid fried, fatty, greasy, spicy, citrus foods. Avoid caffeine and carbonated beverages. Avoid chocolate. Try eating 4-6 small meals a day rather than 3 large meals. Do not eat within 3 hours of laying down. Prop head of bed up on wood or bricks to create a 6 inch incline.  Please call Dr. Ouida Sills and discussed your lower blood pressures with him, you may need some adjustments in your anti-hypertensive medications.  We will follow-up in 6 months, sooner if needed.  I hope you have a wonderful Thanksgiving and Christmas!   It was a pleasure to see you today. I want to create trusting relationships with patients. If you receive a survey regarding your visit,  I greatly appreciate you taking time to fill this out on paper or through your MyChart. I value your feedback.  Brooke Bonito, MSN, FNP-BC, AGACNP-BC [facility]

## 2023-08-28 ENCOUNTER — Other Ambulatory Visit (HOSPITAL_COMMUNITY): Payer: Self-pay | Admitting: Orthopaedic Surgery

## 2023-08-28 DIAGNOSIS — M545 Low back pain, unspecified: Secondary | ICD-10-CM

## 2023-09-12 ENCOUNTER — Ambulatory Visit (HOSPITAL_COMMUNITY)
Admission: RE | Admit: 2023-09-12 | Discharge: 2023-09-12 | Disposition: A | Discharge status: Home / Self Care | Payer: Medicare HMO | Source: Ambulatory Visit | Attending: Orthopaedic Surgery | Admitting: Orthopaedic Surgery

## 2023-09-12 DIAGNOSIS — M545 Low back pain, unspecified: Secondary | ICD-10-CM | POA: Insufficient documentation

## 2023-11-03 ENCOUNTER — Other Ambulatory Visit: Payer: Self-pay | Admitting: Orthopedic Surgery

## 2023-11-30 NOTE — Progress Notes (Signed)
 Surgical Instructions   Your procedure is scheduled on Wednesday, March 5th, 2025. Report to [facility] Main Entrance "A" at 6:30 A.M., then check in with the Admitting office. Any questions or running late day of surgery: call [phone]  Questions prior to your surgery date: call [phone], Monday-Friday, 8am-4pm. If you experience any cold or flu symptoms such as cough, fever, chills, shortness of breath, etc. between now and your scheduled surgery, please notify us at the above number.     Remember:  Do not eat after midnight the night before your surgery  You may drink clear liquids until 5:30 the morning of your surgery.   Clear liquids allowed are: Water, Non-Citrus Juices (without pulp), Carbonated Beverages, Clear Tea (no milk, honey, etc.), Black Coffee Only (NO MILK, CREAM OR POWDERED CREAMER of any kind), and Gatorade.  Patient Instructions  The night before surgery:  No food after midnight. ONLY clear liquids after midnight  The day of surgery (if you do NOT have diabetes):  Drink ONE (1) Pre-Surgery Clear Ensure by 5:30 the morning of surgery. Drink in one sitting. Do not sip.  This drink was given to you during your hospital  pre-op appointment visit.  Nothing else to drink after completing the  Pre-Surgery Clear Ensure.         If you have questions, please contact your surgeon's office.     Take these medicines the morning of surgery with A SIP OF WATER: Oxybutynin (Ditropan)   May take these medicines IF NEEDED: Pantoprazole (Protonix)   Please follow your surgeon's instructions on when to stop your Aspirin.  If no instructions received, please reach out to your surgeon's office.   One week prior to surgery, STOP taking any Aspirin (unless otherwise instructed by your surgeon) Aleve, Naproxen, Ibuprofen, Motrin, Advil, Goody's, BC's, all herbal medications, fish oil, and non-prescription vitamins.                     Do NOT Smoke (Tobacco/Vaping)  for 24 hours prior to your procedure.  If you use a CPAP at night, you may bring your mask/headgear for your overnight stay.   You will be asked to remove any contacts, glasses, piercing's, hearing aid's, dentures/partials prior to surgery. Please bring cases for these items if needed.    Patients discharged the day of surgery will not be allowed to drive home, and someone needs to stay with them for 24 hours.  SURGICAL WAITING ROOM VISITATION Patients may have no more than 2 support people in the waiting area - these visitors may rotate.   Pre-op nurse will coordinate an appropriate time for 1 ADULT support person, who may not rotate, to accompany patient in pre-op.  Children under the age of 42 must have an adult with them who is not the patient and must remain in the main waiting area with an adult.  If the patient needs to stay at the hospital during part of their recovery, the visitor guidelines for inpatient rooms apply.  Please refer to the [facility] website for the visitor guidelines for any additional information.   If you received a COVID test during your pre-op visit  it is requested that you wear a mask when out in public, stay away from anyone that may not be feeling well and notify your surgeon if you develop symptoms. If you have been in contact with anyone that has tested positive in the last 10 days please notify you surgeon.  Pre-operative 5 CHG Bathing Instructions   You can play a key role in reducing the risk of infection after surgery. Your skin needs to be as free of germs as possible. You can reduce the number of germs on your skin by washing with CHG (chlorhexidine gluconate) soap before surgery. CHG is an antiseptic soap that kills germs and continues to kill germs even after washing.   DO NOT use if you have an allergy to chlorhexidine/CHG or antibacterial soaps. If your skin becomes reddened or irritated, stop using the CHG and notify one of our RNs at  [phone].   Please shower with the CHG soap starting 4 days before surgery using the following schedule:     Please keep in mind the following:  DO NOT shave, including legs and underarms, starting the day of your first shower.   You may shave your face at any point before/day of surgery.  Place clean sheets on your bed the day you start using CHG soap. Use a clean washcloth (not used since being washed) for each shower. DO NOT sleep with pets once you start using the CHG.   CHG Shower Instructions:  Wash your face and private area with normal soap. If you choose to wash your hair, wash first with your normal shampoo.  After you use shampoo/soap, rinse your hair and body thoroughly to remove shampoo/soap residue.  Turn the water OFF and apply about 3 tablespoons (45 ml) of CHG soap to a CLEAN washcloth.  Apply CHG soap ONLY FROM YOUR NECK DOWN TO YOUR TOES (washing for 3-5 minutes)  DO NOT use CHG soap on face, private areas, open wounds, or sores.  Pay special attention to the area where your surgery is being performed.  If you are having back surgery, having someone wash your back for you may be helpful. Wait 2 minutes after CHG soap is applied, then you may rinse off the CHG soap.  Pat dry with a clean towel  Put on clean clothes/pajamas   If you choose to wear lotion, please use ONLY the CHG-compatible lotions that are listed below.  Additional instructions for the day of surgery: DO NOT APPLY any lotions, deodorants, cologne, or perfumes.   Do not bring valuables to the hospital. [facility] is not responsible for any belongings/valuables. Do not wear nail polish, gel polish, artificial nails, or any other type of covering on natural nails (fingers and toes) Do not wear jewelry or makeup Put on clean/comfortable clothes.  Please brush your teeth.  Ask your nurse before applying any prescription medications to the skin.     CHG Compatible Lotions   Aveeno Moisturizing  lotion  Cetaphil Moisturizing Cream  Cetaphil Moisturizing Lotion  Clairol Herbal Essence Moisturizing Lotion, Dry Skin  Clairol Herbal Essence Moisturizing Lotion, Extra Dry Skin  Clairol Herbal Essence Moisturizing Lotion, Normal Skin  Curel Age Defying Therapeutic Moisturizing Lotion with Alpha Hydroxy  Curel Extreme Care Body Lotion  Curel Soothing Hands Moisturizing Hand Lotion  Curel Therapeutic Moisturizing Cream, Fragrance-Free  Curel Therapeutic Moisturizing Lotion, Fragrance-Free  Curel Therapeutic Moisturizing Lotion, Original Formula  Eucerin Daily Replenishing Lotion  Eucerin Dry Skin Therapy Plus Alpha Hydroxy Crme  Eucerin Dry Skin Therapy Plus Alpha Hydroxy Lotion  Eucerin Original Crme  Eucerin Original Lotion  Eucerin Plus Crme Eucerin Plus Lotion  Eucerin TriLipid Replenishing Lotion  Keri Anti-Bacterial Hand Lotion  Keri Deep Conditioning Original Lotion Dry Skin Formula Softly Scented  Keri Deep Conditioning Original Lotion, Fragrance Free Sensitive  Skin Formula  Keri Lotion Fast Absorbing Fragrance Free Sensitive Skin Formula  Keri Lotion Fast Absorbing Softly Scented Dry Skin Formula  Keri Original Lotion  Keri Skin Renewal Lotion Keri Silky Smooth Lotion  Keri Silky Smooth Sensitive Skin Lotion  Nivea Body Creamy Conditioning Oil  Nivea Body Extra Enriched Lotion  Nivea Body Original Lotion  Nivea Body Sheer Moisturizing Lotion Nivea Crme  Nivea Skin Firming Lotion  NutraDerm 30 Skin Lotion  NutraDerm Skin Lotion  NutraDerm Therapeutic Skin Cream  NutraDerm Therapeutic Skin Lotion  ProShield Protective Hand Cream  Provon moisturizing lotion  Please read over the following fact sheets that you were given.

## 2023-12-01 ENCOUNTER — Encounter (HOSPITAL_COMMUNITY): Payer: Self-pay

## 2023-12-01 ENCOUNTER — Encounter (HOSPITAL_COMMUNITY)
Admission: RE | Admit: 2023-12-01 | Discharge: 2023-12-01 | Disposition: A | Discharge status: Home / Self Care | Payer: Medicare HMO | Source: Ambulatory Visit | Attending: Orthopedic Surgery

## 2023-12-01 ENCOUNTER — Other Ambulatory Visit: Payer: Self-pay

## 2023-12-01 VITALS — BP 98/58 | HR 61 | Temp 97.7°F | Resp 18 | Ht 68.0 in | Wt 266.0 lb

## 2023-12-01 DIAGNOSIS — Z01818 Encounter for other preprocedural examination: Secondary | ICD-10-CM | POA: Diagnosis present

## 2023-12-01 DIAGNOSIS — I10 Essential (primary) hypertension: Secondary | ICD-10-CM | POA: Diagnosis not present

## 2023-12-01 HISTORY — DX: Unspecified osteoarthritis, unspecified site: M19.90

## 2023-12-01 LAB — TYPE AND SCREEN
ABO/RH(D): O POS
Antibody Screen: NEGATIVE

## 2023-12-01 LAB — BASIC METABOLIC PANEL
Anion gap: 6 (ref 5–15)
BUN: 13 mg/dL (ref 8–23)
CO2: 27 mmol/L (ref 22–32)
Calcium: 9.1 mg/dL (ref 8.9–10.3)
Chloride: 103 mmol/L (ref 98–111)
Creatinine, Ser: 0.87 mg/dL (ref 0.44–1.00)
GFR, Estimated: >60 mL/min (ref >60)
Glucose, Bld: 134 mg/dL — ABNORMAL HIGH (ref 70–99)
Potassium: 3.5 mmol/L (ref 3.5–5.1)
Sodium: 136 mmol/L (ref 135–145)

## 2023-12-01 LAB — CBC
HCT: 42.8 % (ref 36.0–46.0)
Hemoglobin: 14.1 g/dL (ref 12.0–15.0)
MCH: 30.3 pg (ref 26.0–34.0)
MCHC: 32.9 g/dL (ref 30.0–36.0)
MCV: 92 fL (ref 80.0–100.0)
Platelets: 281 10*3/uL (ref 150–400)
RBC: 4.65 MIL/uL (ref 3.87–5.11)
RDW: 13.1 % (ref 11.5–15.5)
WBC: 8.2 10*3/uL (ref 4.0–10.5)
nRBC: 0 % (ref 0.0–0.2)

## 2023-12-01 LAB — SURGICAL PCR SCREEN
MRSA, PCR: NEGATIVE
Staphylococcus aureus: NEGATIVE

## 2023-12-01 NOTE — Progress Notes (Signed)
 Surgical Instructions   Your procedure is scheduled on Wednesday, March 5th, 2025. Report to [facility] Main Entrance "A" at 6:30 A.M., then check in with the Admitting office. Any questions or running late day of surgery: call [phone]  Questions prior to your surgery date: call [phone], Monday-Friday, 8am-4pm. If you experience any cold or flu symptoms such as cough, fever, chills, shortness of breath, etc. between now and your scheduled surgery, please notify us at the above number.     Remember:  Do not eat after midnight the night before your surgery  You may drink clear liquids until 5:30 the morning of your surgery.   Clear liquids allowed are: Water, Non-Citrus Juices (without pulp), Carbonated Beverages, Clear Tea (no milk, honey, etc.), Black Coffee Only (NO MILK, CREAM OR POWDERED CREAMER of any kind), and Gatorade.  Patient Instructions  The night before surgery:  No food after midnight. ONLY clear liquids after midnight  The day of surgery (if you do NOT have diabetes):  Drink ONE (1) Pre-Surgery Clear Ensure by 5:30 the morning of surgery. Drink in one sitting. Do not sip.  This drink was given to you during your hospital  pre-op appointment visit.  Nothing else to drink after completing the  Pre-Surgery Clear Ensure.         If you have questions, please contact your surgeon's office.     Take these medicines the morning of surgery with A SIP OF WATER: Oxybutynin (Ditropan)   May take these medicines IF NEEDED: Pantoprazole (Protonix)   Please follow your surgeon's instructions on when to stop your Aspirin.  If no instructions received, please reach out to your surgeon's office.   One week prior to surgery, STOP taking any Aleve, Naproxen, Ibuprofen, Motrin, Advil, Goody's, BC's, all herbal medications, fish oil, diclofenac Sodium (VOLTAREN) gel, and non-prescription vitamins.                     Do NOT Smoke (Tobacco/Vaping) for 24 hours prior  to your procedure.  If you use a CPAP at night, you may bring your mask/headgear for your overnight stay.   You will be asked to remove any contacts, glasses, piercing's, hearing aid's, dentures/partials prior to surgery. Please bring cases for these items if needed.    Patients discharged the day of surgery will not be allowed to drive home, and someone needs to stay with them for 24 hours.  SURGICAL WAITING ROOM VISITATION Patients may have no more than 2 support people in the waiting area - these visitors may rotate.   Pre-op nurse will coordinate an appropriate time for 1 ADULT support person, who may not rotate, to accompany patient in pre-op.  Children under the age of 67 must have an adult with them who is not the patient and must remain in the main waiting area with an adult.  If the patient needs to stay at the hospital during part of their recovery, the visitor guidelines for inpatient rooms apply.  Please refer to the [facility] website for the visitor guidelines for any additional information.   If you received a COVID test during your pre-op visit  it is requested that you wear a mask when out in public, stay away from anyone that may not be feeling well and notify your surgeon if you develop symptoms. If you have been in contact with anyone that has tested positive in the last 10 days please notify you surgeon.  Pre-operative 5 CHG Bathing Instructions   You can play a key role in reducing the risk of infection after surgery. Your skin needs to be as free of germs as possible. You can reduce the number of germs on your skin by washing with CHG (chlorhexidine gluconate) soap before surgery. CHG is an antiseptic soap that kills germs and continues to kill germs even after washing.   DO NOT use if you have an allergy to chlorhexidine/CHG or antibacterial soaps. If your skin becomes reddened or irritated, stop using the CHG and notify one of our RNs at [phone].    Please shower with the CHG soap starting 4 days before surgery using the following schedule:     Please keep in mind the following:  DO NOT shave, including legs and underarms, starting the day of your first shower.   You may shave your face at any point before/day of surgery.  Place clean sheets on your bed the day you start using CHG soap. Use a clean washcloth (not used since being washed) for each shower. DO NOT sleep with pets once you start using the CHG.   CHG Shower Instructions:  Wash your face and private area with normal soap. If you choose to wash your hair, wash first with your normal shampoo.  After you use shampoo/soap, rinse your hair and body thoroughly to remove shampoo/soap residue.  Turn the water OFF and apply about 3 tablespoons (45 ml) of CHG soap to a CLEAN washcloth.  Apply CHG soap ONLY FROM YOUR NECK DOWN TO YOUR TOES (washing for 3-5 minutes)  DO NOT use CHG soap on face, private areas, open wounds, or sores.  Pay special attention to the area where your surgery is being performed.  If you are having back surgery, having someone wash your back for you may be helpful. Wait 2 minutes after CHG soap is applied, then you may rinse off the CHG soap.  Pat dry with a clean towel  Put on clean clothes/pajamas   If you choose to wear lotion, please use ONLY the CHG-compatible lotions that are listed below.  Additional instructions for the day of surgery: DO NOT APPLY any lotions, deodorants, cologne, or perfumes.   Do not bring valuables to the hospital. [facility] is not responsible for any belongings/valuables. Do not wear nail polish, gel polish, artificial nails, or any other type of covering on natural nails (fingers and toes) Do not wear jewelry or makeup Put on clean/comfortable clothes.  Please brush your teeth.  Ask your nurse before applying any prescription medications to the skin.     CHG Compatible Lotions   Aveeno Moisturizing lotion   Cetaphil Moisturizing Cream  Cetaphil Moisturizing Lotion  Clairol Herbal Essence Moisturizing Lotion, Dry Skin  Clairol Herbal Essence Moisturizing Lotion, Extra Dry Skin  Clairol Herbal Essence Moisturizing Lotion, Normal Skin  Curel Age Defying Therapeutic Moisturizing Lotion with Alpha Hydroxy  Curel Extreme Care Body Lotion  Curel Soothing Hands Moisturizing Hand Lotion  Curel Therapeutic Moisturizing Cream, Fragrance-Free  Curel Therapeutic Moisturizing Lotion, Fragrance-Free  Curel Therapeutic Moisturizing Lotion, Original Formula  Eucerin Daily Replenishing Lotion  Eucerin Dry Skin Therapy Plus Alpha Hydroxy Crme  Eucerin Dry Skin Therapy Plus Alpha Hydroxy Lotion  Eucerin Original Crme  Eucerin Original Lotion  Eucerin Plus Crme Eucerin Plus Lotion  Eucerin TriLipid Replenishing Lotion  Keri Anti-Bacterial Hand Lotion  Keri Deep Conditioning Original Lotion Dry Skin Formula Softly Scented  Keri Deep Conditioning Original Lotion, Fragrance Free Sensitive  Skin Formula  Keri Lotion Fast Absorbing Fragrance Free Sensitive Skin Formula  Keri Lotion Fast Absorbing Softly Scented Dry Skin Formula  Keri Original Lotion  Keri Skin Renewal Lotion Keri Silky Smooth Lotion  Keri Silky Smooth Sensitive Skin Lotion  Nivea Body Creamy Conditioning Oil  Nivea Body Extra Enriched Lotion  Nivea Body Original Lotion  Nivea Body Sheer Moisturizing Lotion Nivea Crme  Nivea Skin Firming Lotion  NutraDerm 30 Skin Lotion  NutraDerm Skin Lotion  NutraDerm Therapeutic Skin Cream  NutraDerm Therapeutic Skin Lotion  ProShield Protective Hand Cream  Provon moisturizing lotion  Please read over the following fact sheets that you were given.

## 2023-12-01 NOTE — Progress Notes (Addendum)
 PCP - Dr. Carylon Perches Cardiologist - denies  PPM/ICD - denies   Chest x-ray - 02/29/16 EKG - 12/01/23 Stress Test - denies ECHO - denies Cardiac Cath - denies  Sleep Study - denies   DM- denies  Last dose of GLP1 agonist-  n/a   Blood Thinner Instructions: n/a Aspirin Instructions: f/u with surgeon  ERAS Protcol - clears until 0530 PRE-SURGERY Ensure given at PAT  COVID TEST- n/a   Anesthesia review: yes, abnormal EKG  Patient denies shortness of breath, fever, cough and chest pain at PAT appointment   All instructions explained to the patient, with a verbal understanding of the material. Patient agrees to go over the instructions while at home for a better understanding.  The opportunity to ask questions was provided.

## 2023-12-09 ENCOUNTER — Ambulatory Visit (HOSPITAL_BASED_OUTPATIENT_CLINIC_OR_DEPARTMENT_OTHER): Admitting: Certified Registered Nurse Anesthetist

## 2023-12-09 ENCOUNTER — Ambulatory Visit (HOSPITAL_COMMUNITY)

## 2023-12-09 ENCOUNTER — Ambulatory Visit (HOSPITAL_COMMUNITY): Payer: Self-pay | Admitting: Physician Assistant

## 2023-12-09 ENCOUNTER — Observation Stay (HOSPITAL_COMMUNITY)
Admission: RE | Admit: 2023-12-09 | Discharge: 2023-12-10 | Disposition: A | Discharge status: Home / Self Care | Payer: Medicare HMO | Attending: Orthopedic Surgery | Admitting: Orthopedic Surgery

## 2023-12-09 ENCOUNTER — Other Ambulatory Visit: Payer: Self-pay

## 2023-12-09 ENCOUNTER — Encounter (HOSPITAL_COMMUNITY)
Admission: RE | Disposition: A | Discharge status: Home / Self Care | Payer: Self-pay | Source: Home / Self Care | Attending: Orthopedic Surgery

## 2023-12-09 ENCOUNTER — Encounter (HOSPITAL_COMMUNITY): Payer: Self-pay | Admitting: Orthopedic Surgery

## 2023-12-09 DIAGNOSIS — Z79899 Other long term (current) drug therapy: Secondary | ICD-10-CM | POA: Diagnosis not present

## 2023-12-09 DIAGNOSIS — Z96653 Presence of artificial knee joint, bilateral: Secondary | ICD-10-CM | POA: Diagnosis not present

## 2023-12-09 DIAGNOSIS — M48061 Spinal stenosis, lumbar region without neurogenic claudication: Secondary | ICD-10-CM | POA: Insufficient documentation

## 2023-12-09 DIAGNOSIS — M4316 Spondylolisthesis, lumbar region: Principal | ICD-10-CM | POA: Insufficient documentation

## 2023-12-09 DIAGNOSIS — Z8673 Personal history of transient ischemic attack (TIA), and cerebral infarction without residual deficits: Secondary | ICD-10-CM | POA: Insufficient documentation

## 2023-12-09 DIAGNOSIS — M5416 Radiculopathy, lumbar region: Principal | ICD-10-CM | POA: Diagnosis present

## 2023-12-09 DIAGNOSIS — I10 Essential (primary) hypertension: Secondary | ICD-10-CM | POA: Diagnosis not present

## 2023-12-09 DIAGNOSIS — Z7982 Long term (current) use of aspirin: Secondary | ICD-10-CM | POA: Insufficient documentation

## 2023-12-09 HISTORY — PX: TRANSFORAMINAL LUMBAR INTERBODY FUSION (TLIF) WITH PEDICLE SCREW FIXATION 1 LEVEL: SHX6141

## 2023-12-09 SURGERY — TRANSFORAMINAL LUMBAR INTERBODY FUSION (TLIF) WITH PEDICLE SCREW FIXATION 1 LEVEL
Anesthesia: General | Site: Spine Lumbar | Laterality: Left

## 2023-12-09 MED ORDER — FLEET ENEMA RE ENEM: 1.0000 | ENEMA | Freq: Once | RECTAL | Status: DC | PRN

## 2023-12-09 MED ORDER — PHENOL 1.4 % MT LIQD: 1.0000 | OROMUCOSAL | Status: DC | PRN

## 2023-12-09 MED ORDER — MIDAZOLAM HCL 2 MG/2ML IJ SOLN
INTRAMUSCULAR | Status: DC | PRN
Start: 2023-12-09 — End: 2023-12-09
  Administered 2023-12-09: 2 mg via INTRAVENOUS

## 2023-12-09 MED ORDER — ONDANSETRON HCL 4 MG/2ML IJ SOLN: 4.0000 mg | Freq: Once | INTRAMUSCULAR | Status: DC | PRN

## 2023-12-09 MED ORDER — SUCCINYLCHOLINE CHLORIDE 200 MG/10ML IV SOSY
PREFILLED_SYRINGE | INTRAVENOUS | Status: AC
  Filled 2023-12-09: qty 10

## 2023-12-09 MED ORDER — SUGAMMADEX SODIUM 200 MG/2ML IV SOLN
INTRAVENOUS | Status: AC
Start: 2023-12-09 — End: ?
  Filled 2023-12-09: qty 2

## 2023-12-09 MED ORDER — DEXAMETHASONE SODIUM PHOSPHATE 10 MG/ML IJ SOLN
INTRAMUSCULAR | Status: DC | PRN
  Administered 2023-12-09: 5 mg via INTRAVENOUS

## 2023-12-09 MED ORDER — ACETAMINOPHEN 325 MG PO TABS
650.0000 mg | ORAL_TABLET | ORAL | Status: DC | PRN
  Administered 2023-12-10: 650 mg via ORAL
  Filled 2023-12-09: qty 2

## 2023-12-09 MED ORDER — PROPOFOL 10 MG/ML IV BOLUS
INTRAVENOUS | Status: DC | PRN
  Administered 2023-12-09: 100 mg via INTRAVENOUS

## 2023-12-09 MED ORDER — BUPIVACAINE-EPINEPHRINE (PF) 0.25% -1:200000 IJ SOLN
INTRAMUSCULAR | Status: DC | PRN
  Administered 2023-12-09: 40 mL

## 2023-12-09 MED ORDER — LIDOCAINE 2% (20 MG/ML) 5 ML SYRINGE
INTRAMUSCULAR | Status: DC | PRN
  Administered 2023-12-09: 100 mg via INTRAVENOUS

## 2023-12-09 MED ORDER — OXYCODONE HCL 5 MG/5ML PO SOLN: 5.0000 mg | Freq: Once | ORAL | Status: DC | PRN

## 2023-12-09 MED ORDER — BUPIVACAINE LIPOSOME 1.3 % IJ SUSP
INTRAMUSCULAR | Status: AC
  Filled 2023-12-09: qty 20

## 2023-12-09 MED ORDER — CHLORHEXIDINE GLUCONATE 0.12 % MT SOLN
15.0000 mL | Freq: Once | OROMUCOSAL | Status: AC
  Administered 2023-12-09: 15 mL via OROMUCOSAL
  Filled 2023-12-09: qty 15

## 2023-12-09 MED ORDER — CEFAZOLIN SODIUM-DEXTROSE 2-4 GM/100ML-% IV SOLN
2.0000 g | Freq: Three times a day (TID) | INTRAVENOUS | Status: AC
  Administered 2023-12-09 – 2023-12-10 (×2): 2 g via INTRAVENOUS
  Filled 2023-12-09 (×2): qty 100

## 2023-12-09 MED ORDER — FENTANYL CITRATE (PF) 100 MCG/2ML IJ SOLN
INTRAMUSCULAR | Status: AC
  Administered 2023-12-09: 25 ug via INTRAVENOUS
  Filled 2023-12-09: qty 2

## 2023-12-09 MED ORDER — DOCUSATE SODIUM 100 MG PO CAPS
100.0000 mg | ORAL_CAPSULE | Freq: Two times a day (BID) | ORAL | Status: DC
Start: 2023-12-09 — End: 2023-12-10
  Administered 2023-12-09 – 2023-12-10 (×2): 100 mg via ORAL
  Filled 2023-12-09 (×3): qty 1

## 2023-12-09 MED ORDER — ONDANSETRON HCL 4 MG/2ML IJ SOLN
INTRAMUSCULAR | Status: DC | PRN
  Administered 2023-12-09: 4 mg via INTRAVENOUS

## 2023-12-09 MED ORDER — SODIUM CHLORIDE 0.9% FLUSH: 3.0000 mL | INTRAVENOUS | Status: DC | PRN

## 2023-12-09 MED ORDER — ALBUMIN HUMAN 5 % IV SOLN: INTRAVENOUS | Status: DC | PRN

## 2023-12-09 MED ORDER — 0.9 % SODIUM CHLORIDE (POUR BTL) OPTIME
TOPICAL | Status: DC | PRN
  Administered 2023-12-09 (×2): 1000 mL

## 2023-12-09 MED ORDER — SODIUM CHLORIDE 0.9% FLUSH: 3.0000 mL | Freq: Two times a day (BID) | INTRAVENOUS | Status: DC

## 2023-12-09 MED ORDER — PHENYLEPHRINE 80 MCG/ML (10ML) SYRINGE FOR IV PUSH (FOR BLOOD PRESSURE SUPPORT)
PREFILLED_SYRINGE | INTRAVENOUS | Status: AC
  Filled 2023-12-09: qty 10

## 2023-12-09 MED ORDER — FENTANYL CITRATE (PF) 100 MCG/2ML IJ SOLN: 25.0000 ug | INTRAMUSCULAR | Status: DC | PRN

## 2023-12-09 MED ORDER — TIZANIDINE HCL 2 MG PO CAPS
2.0000 mg | ORAL_CAPSULE | Freq: Four times a day (QID) | ORAL | 2 refills | Status: AC | PRN
Start: 2023-12-09 — End: ?

## 2023-12-09 MED ORDER — HYDROCODONE-ACETAMINOPHEN 5-325 MG PO TABS
1.0000 | ORAL_TABLET | ORAL | Status: DC | PRN
  Administered 2023-12-09 – 2023-12-10 (×4): 1 via ORAL
  Filled 2023-12-09 (×4): qty 1

## 2023-12-09 MED ORDER — MIDAZOLAM HCL 2 MG/2ML IJ SOLN
INTRAMUSCULAR | Status: AC
  Filled 2023-12-09: qty 2

## 2023-12-09 MED ORDER — ORAL CARE MOUTH RINSE: 15.0000 mL | Freq: Once | OROMUCOSAL | Status: AC

## 2023-12-09 MED ORDER — PHENYLEPHRINE HCL (PRESSORS) 10 MG/ML IV SOLN
INTRAVENOUS | Status: DC | PRN
  Administered 2023-12-09 (×3): 80 ug via INTRAVENOUS

## 2023-12-09 MED ORDER — LISINOPRIL 20 MG PO TABS: 20.0000 mg | ORAL_TABLET | Freq: Every day | ORAL | Status: DC

## 2023-12-09 MED ORDER — MENTHOL 3 MG MT LOZG: 1.0000 | LOZENGE | OROMUCOSAL | Status: DC | PRN

## 2023-12-09 MED ORDER — SODIUM CHLORIDE 0.9 % IV SOLN
250.0000 mL | INTRAVENOUS | Status: AC
Start: 2023-12-09 — End: 2023-12-10
  Administered 2023-12-09: 250 mL via INTRAVENOUS

## 2023-12-09 MED ORDER — ALUM & MAG HYDROXIDE-SIMETH 200-200-20 MG/5ML PO SUSP: 30.0000 mL | Freq: Four times a day (QID) | ORAL | Status: DC | PRN

## 2023-12-09 MED ORDER — ACETAMINOPHEN 10 MG/ML IV SOLN
INTRAVENOUS | Status: AC
  Filled 2023-12-09: qty 100

## 2023-12-09 MED ORDER — BUPIVACAINE-EPINEPHRINE 0.25% -1:200000 IJ SOLN
INTRAMUSCULAR | Status: DC | PRN
  Administered 2023-12-09: 7 mL

## 2023-12-09 MED ORDER — MORPHINE SULFATE (PF) 2 MG/ML IV SOLN: 1.0000 mg | INTRAVENOUS | Status: DC | PRN

## 2023-12-09 MED ORDER — CEFAZOLIN SODIUM-DEXTROSE 2-4 GM/100ML-% IV SOLN
2.0000 g | INTRAVENOUS | Status: AC
  Administered 2023-12-09: 2 g via INTRAVENOUS
  Filled 2023-12-09: qty 100

## 2023-12-09 MED ORDER — ONDANSETRON HCL 4 MG PO TABS: 4.0000 mg | ORAL_TABLET | Freq: Four times a day (QID) | ORAL | Status: DC | PRN

## 2023-12-09 MED ORDER — PHENYLEPHRINE HCL-NACL 20-0.9 MG/250ML-% IV SOLN
INTRAVENOUS | Status: DC | PRN
  Administered 2023-12-09: 25 ug/min via INTRAVENOUS

## 2023-12-09 MED ORDER — ONDANSETRON HCL 4 MG/2ML IJ SOLN
INTRAMUSCULAR | Status: AC
Start: 2023-12-09 — End: ?
  Filled 2023-12-09: qty 2

## 2023-12-09 MED ORDER — SENNOSIDES-DOCUSATE SODIUM 8.6-50 MG PO TABS: 1.0000 | ORAL_TABLET | Freq: Every evening | ORAL | Status: DC | PRN

## 2023-12-09 MED ORDER — SUGAMMADEX SODIUM 200 MG/2ML IV SOLN
INTRAVENOUS | Status: DC | PRN
  Administered 2023-12-09: 300 mg via INTRAVENOUS

## 2023-12-09 MED ORDER — EPHEDRINE SULFATE (PRESSORS) 50 MG/ML IJ SOLN
INTRAMUSCULAR | Status: DC | PRN
  Administered 2023-12-09: 5 mg via INTRAVENOUS

## 2023-12-09 MED ORDER — OXYCODONE HCL 5 MG PO TABS: 5.0000 mg | ORAL_TABLET | Freq: Once | ORAL | Status: DC | PRN

## 2023-12-09 MED ORDER — OXYCODONE-ACETAMINOPHEN 5-325 MG PO TABS
1.0000 | ORAL_TABLET | ORAL | 0 refills | Status: AC | PRN
Start: 2023-12-09 — End: ?

## 2023-12-09 MED ORDER — BISACODYL 5 MG PO TBEC
5.0000 mg | DELAYED_RELEASE_TABLET | Freq: Every day | ORAL | Status: DC | PRN
  Administered 2023-12-09: 5 mg via ORAL
  Filled 2023-12-09: qty 1

## 2023-12-09 MED ORDER — STERILE WATER FOR IRRIGATION IR SOLN
Status: DC | PRN
  Administered 2023-12-09: 1000 mL

## 2023-12-09 MED ORDER — FENTANYL CITRATE (PF) 250 MCG/5ML IJ SOLN
INTRAMUSCULAR | Status: DC | PRN
  Administered 2023-12-09: 100 ug via INTRAVENOUS
  Administered 2023-12-09: 50 ug via INTRAVENOUS

## 2023-12-09 MED ORDER — OXYCODONE-ACETAMINOPHEN 5-325 MG PO TABS: 1.0000 | ORAL_TABLET | ORAL | Status: DC | PRN

## 2023-12-09 MED ORDER — PROPOFOL 1000 MG/100ML IV EMUL
INTRAVENOUS | Status: AC
  Filled 2023-12-09: qty 100

## 2023-12-09 MED ORDER — PHENYLEPHRINE 80 MCG/ML (10ML) SYRINGE FOR IV PUSH (FOR BLOOD PRESSURE SUPPORT)
PREFILLED_SYRINGE | INTRAVENOUS | Status: DC | PRN
  Administered 2023-12-09 (×4): 80 ug via INTRAVENOUS

## 2023-12-09 MED ORDER — LACTATED RINGERS IV SOLN: INTRAVENOUS | Status: DC

## 2023-12-09 MED ORDER — ROCURONIUM BROMIDE 10 MG/ML (PF) SYRINGE
PREFILLED_SYRINGE | INTRAVENOUS | Status: AC
  Filled 2023-12-09: qty 10

## 2023-12-09 MED ORDER — BUPIVACAINE-EPINEPHRINE (PF) 0.25% -1:200000 IJ SOLN
INTRAMUSCULAR | Status: AC
  Filled 2023-12-09: qty 30

## 2023-12-09 MED ORDER — ACETAMINOPHEN 650 MG RE SUPP: 650.0000 mg | RECTAL | Status: DC | PRN

## 2023-12-09 MED ORDER — OXYCODONE HCL 5 MG/5ML PO SOLN
ORAL | Status: AC
  Filled 2023-12-09: qty 5

## 2023-12-09 MED ORDER — METHOCARBAMOL 1000 MG/10ML IJ SOLN: 500.0000 mg | Freq: Four times a day (QID) | INTRAMUSCULAR | Status: DC | PRN

## 2023-12-09 MED ORDER — LIDOCAINE 2% (20 MG/ML) 5 ML SYRINGE
INTRAMUSCULAR | Status: AC
  Filled 2023-12-09: qty 5

## 2023-12-09 MED ORDER — FENTANYL CITRATE (PF) 250 MCG/5ML IJ SOLN
INTRAMUSCULAR | Status: AC
Start: 2023-12-09 — End: ?
  Filled 2023-12-09: qty 5

## 2023-12-09 MED ORDER — HYDROCHLOROTHIAZIDE 25 MG PO TABS: 25.0000 mg | ORAL_TABLET | Freq: Every day | ORAL | Status: DC

## 2023-12-09 MED ORDER — HYDROCODONE-ACETAMINOPHEN 5-325 MG PO TABS
1.0000 | ORAL_TABLET | ORAL | Status: DC | PRN
  Administered 2023-12-09: 1 via ORAL
  Filled 2023-12-09: qty 2

## 2023-12-09 MED ORDER — METHOCARBAMOL 500 MG PO TABS
500.0000 mg | ORAL_TABLET | Freq: Four times a day (QID) | ORAL | Status: DC | PRN
  Administered 2023-12-10: 500 mg via ORAL
  Filled 2023-12-09: qty 1

## 2023-12-09 MED ORDER — ACETAMINOPHEN 10 MG/ML IV SOLN
1000.0000 mg | Freq: Once | INTRAVENOUS | Status: DC | PRN
  Administered 2023-12-09: 1000 mg via INTRAVENOUS

## 2023-12-09 MED ORDER — ROCURONIUM BROMIDE 10 MG/ML (PF) SYRINGE
PREFILLED_SYRINGE | INTRAVENOUS | Status: DC | PRN
  Administered 2023-12-09 (×4): 20 mg via INTRAVENOUS
  Administered 2023-12-09: 80 mg via INTRAVENOUS

## 2023-12-09 MED ORDER — SIMVASTATIN 20 MG PO TABS
40.0000 mg | ORAL_TABLET | Freq: Every day | ORAL | Status: DC
  Administered 2023-12-09: 40 mg via ORAL
  Filled 2023-12-09: qty 2

## 2023-12-09 MED ORDER — PROPOFOL 10 MG/ML IV BOLUS
INTRAVENOUS | Status: AC
  Filled 2023-12-09: qty 20

## 2023-12-09 MED ORDER — EPHEDRINE 5 MG/ML INJ
INTRAVENOUS | Status: AC
  Filled 2023-12-09: qty 5

## 2023-12-09 MED ORDER — LISINOPRIL-HYDROCHLOROTHIAZIDE 20-25 MG PO TABS: 1.0000 | ORAL_TABLET | Freq: Every day | ORAL | Status: DC

## 2023-12-09 MED ORDER — ZOLPIDEM TARTRATE 5 MG PO TABS: 5.0000 mg | ORAL_TABLET | Freq: Every evening | ORAL | Status: DC | PRN

## 2023-12-09 MED ORDER — OXYBUTYNIN CHLORIDE 5 MG PO TABS
5.0000 mg | ORAL_TABLET | Freq: Every day | ORAL | Status: DC
  Administered 2023-12-09: 5 mg via ORAL
  Filled 2023-12-09 (×2): qty 1

## 2023-12-09 MED ORDER — THROMBIN 20000 UNITS EX SOLR
CUTANEOUS | Status: AC
  Filled 2023-12-09: qty 20000

## 2023-12-09 MED ORDER — SODIUM CHLORIDE 0.9% FLUSH
3.0000 mL | Freq: Two times a day (BID) | INTRAVENOUS | Status: DC
  Administered 2023-12-09 (×2): 3 mL via INTRAVENOUS

## 2023-12-09 MED ORDER — DEXAMETHASONE SODIUM PHOSPHATE 10 MG/ML IJ SOLN
INTRAMUSCULAR | Status: AC
  Filled 2023-12-09: qty 1

## 2023-12-09 MED ORDER — ALBUMIN HUMAN 5 % IV SOLN: 12.5000 g | Freq: Once | INTRAVENOUS | Status: AC

## 2023-12-09 MED ORDER — ALBUMIN HUMAN 5 % IV SOLN
INTRAVENOUS | Status: AC
  Administered 2023-12-09: 12.5 g via INTRAVENOUS
  Filled 2023-12-09: qty 250

## 2023-12-09 MED ORDER — THROMBIN 20000 UNITS EX KIT
PACK | CUTANEOUS | Status: DC | PRN
  Administered 2023-12-09: 20 mL via TOPICAL

## 2023-12-09 MED ORDER — ONDANSETRON HCL 4 MG/2ML IJ SOLN: 4.0000 mg | Freq: Four times a day (QID) | INTRAMUSCULAR | Status: DC | PRN

## 2023-12-09 SURGICAL SUPPLY — 81 items
BAG COUNTER SPONGE SURGICOUNT (BAG) ×1 IMPLANT
BENZOIN TINCTURE PRP APPL 2/3 (GAUZE/BANDAGES/DRESSINGS) ×1 IMPLANT
BLADE CLIPPER SURG (BLADE) IMPLANT
BUR PRESCISION 1.7 ELITE (BURR) ×1 IMPLANT
BUR ROUND FLUTED 5 RND (BURR) ×1 IMPLANT
BUR ROUND PRECISION 4.0 (BURR) IMPLANT
BUR SABER RD CUTTING 3.0 (BURR) IMPLANT
CAGE SABLE 10X22 6-12 0D (Cage) IMPLANT
CANNULA GRAFT BNE VG PRE-FILL (Bone Implant) IMPLANT
CNTNR URN SCR LID CUP LEK RST (MISCELLANEOUS) ×1 IMPLANT
COVER BACK TABLE 60X90IN (DRAPES) ×1 IMPLANT
COVER MAYO STAND STRL (DRAPES) ×2 IMPLANT
COVER SURGICAL LIGHT HANDLE (MISCELLANEOUS) ×1 IMPLANT
DISPENSER GRAFT BNE VG (MISCELLANEOUS) IMPLANT
DISPENSER VIVIGEN BONE GRAFT (MISCELLANEOUS) ×1 IMPLANT
DRAIN CHANNEL 15F RND FF W/TCR (WOUND CARE) IMPLANT
DRAPE C-ARM 42X72 X-RAY (DRAPES) ×1 IMPLANT
DRAPE C-ARMOR (DRAPES) IMPLANT
DRAPE POUCH INSTRU U-SHP 10X18 (DRAPES) ×1 IMPLANT
DRAPE SURG 17X23 STRL (DRAPES) ×4 IMPLANT
DURAPREP 26ML APPLICATOR (WOUND CARE) ×1 IMPLANT
ELECT BLADE 4.0 EZ CLEAN MEGAD (MISCELLANEOUS) ×1 IMPLANT
ELECT CAUTERY BLADE 6.4 (BLADE) ×1 IMPLANT
ELECT REM PT RETURN 9FT ADLT (ELECTROSURGICAL) ×1 IMPLANT
ELECTRODE BLDE 4.0 EZ CLN MEGD (MISCELLANEOUS) ×1 IMPLANT
ELECTRODE REM PT RTRN 9FT ADLT (ELECTROSURGICAL) ×1 IMPLANT
EVACUATOR SILICONE 100CC (DRAIN) IMPLANT
GAUZE 4X4 16PLY ~~LOC~~+RFID DBL (SPONGE) ×1 IMPLANT
GAUZE SPONGE 4X4 12PLY STRL (GAUZE/BANDAGES/DRESSINGS) ×1 IMPLANT
GLOVE BIO SURGEON STRL SZ 6.5 (GLOVE) ×1 IMPLANT
GLOVE BIO SURGEON STRL SZ8 (GLOVE) ×1 IMPLANT
GLOVE BIOGEL PI IND STRL 7.0 (GLOVE) ×1 IMPLANT
GLOVE BIOGEL PI IND STRL 8 (GLOVE) ×1 IMPLANT
GLOVE SURG ENC MOIS LTX SZ6.5 (GLOVE) ×1 IMPLANT
GOWN STRL REUS W/ TWL LRG LVL3 (GOWN DISPOSABLE) ×2 IMPLANT
GOWN STRL REUS W/ TWL XL LVL3 (GOWN DISPOSABLE) ×1 IMPLANT
GRAFT BONE CANNULA VIVIGEN 3 (Bone Implant) ×2 IMPLANT
IV CATH 14GX2 1/4 (CATHETERS) ×1 IMPLANT
KIT BASIN OR (CUSTOM PROCEDURE TRAY) ×1 IMPLANT
KIT POSITION SURG JACKSON T1 (MISCELLANEOUS) ×1 IMPLANT
KIT TURNOVER KIT B (KITS) ×1 IMPLANT
MARKER SKIN DUAL TIP RULER LAB (MISCELLANEOUS) ×2 IMPLANT
NDL 18GX1X1/2 (RX/OR ONLY) (NEEDLE) ×1 IMPLANT
NDL 22X1.5 STRL (OR ONLY) (MISCELLANEOUS) ×2 IMPLANT
NDL HYPO 25GX1X1/2 BEV (NEEDLE) ×1 IMPLANT
NDL SPNL 18GX3.5 QUINCKE PK (NEEDLE) ×2 IMPLANT
NEEDLE 18GX1X1/2 (RX/OR ONLY) (NEEDLE) ×1 IMPLANT
NEEDLE 22X1.5 STRL (OR ONLY) (MISCELLANEOUS) ×2 IMPLANT
NEEDLE HYPO 25GX1X1/2 BEV (NEEDLE) ×1 IMPLANT
NEEDLE SPNL 18GX3.5 QUINCKE PK (NEEDLE) ×2 IMPLANT
NS IRRIG 1000ML POUR BTL (IV SOLUTION) ×1 IMPLANT
PACK LAMINECTOMY ORTHO (CUSTOM PROCEDURE TRAY) ×1 IMPLANT
PACK UNIVERSAL I (CUSTOM PROCEDURE TRAY) ×1 IMPLANT
PAD ARMBOARD 7.5X6 YLW CONV (MISCELLANEOUS) ×2 IMPLANT
PUTTY DBX 2.5CC (Putty) ×1 IMPLANT
PUTTY DBX 2.5CC DEPUY (Putty) IMPLANT
ROD PRE BENT EXPEDIUM 35MM (Rod) IMPLANT
SCREW SET SINGLE INNER (Screw) IMPLANT
SCREW VIPER CORT FIX 6.00X30 (Screw) IMPLANT
SCREW VIPER CORT FIX 6X35 (Screw) IMPLANT
SPONGE INTESTINAL PEANUT (DISPOSABLE) ×1 IMPLANT
SPONGE SURGIFOAM ABS GEL 100 (HEMOSTASIS) ×1 IMPLANT
STRIP CLOSURE SKIN 1/2X4 (GAUZE/BANDAGES/DRESSINGS) ×2 IMPLANT
SURGIFLO W/THROMBIN 8M KIT (HEMOSTASIS) IMPLANT
SUT ETHILON 2 0 FS 18 (SUTURE) IMPLANT
SUT ETHILON 2 0 PSLX (SUTURE) IMPLANT
SUT MNCRL AB 4-0 PS2 18 (SUTURE) IMPLANT
SUT VIC AB 0 CT1 18XCR BRD 8 (SUTURE) ×1 IMPLANT
SUT VIC AB 1 CT1 18XCR BRD 8 (SUTURE) ×1 IMPLANT
SUT VIC AB 2-0 CT2 18 VCP726D (SUTURE) ×1 IMPLANT
SYR 20ML LL LF (SYRINGE) ×2 IMPLANT
SYR BULB IRRIG 60ML STRL (SYRINGE) ×1 IMPLANT
SYR CONTROL 10ML LL (SYRINGE) ×2 IMPLANT
SYR TB 1ML LUER SLIP (SYRINGE) ×1 IMPLANT
TAP EXPEDIUM DL 4.35 (INSTRUMENTS) IMPLANT
TAP EXPEDIUM DL 5.0 (INSTRUMENTS) IMPLANT
TAP EXPEDIUM DL 6.0 (INSTRUMENTS) IMPLANT
TAPE CLOTH 4X10 WHT NS (GAUZE/BANDAGES/DRESSINGS) IMPLANT
TUBE FUNNEL GL DISP (ORTHOPEDIC DISPOSABLE SUPPLIES) IMPLANT
WATER STERILE IRR 1000ML POUR (IV SOLUTION) ×1 IMPLANT
YANKAUER SUCT BULB TIP NO VENT (SUCTIONS) ×1 IMPLANT

## 2023-12-09 NOTE — Anesthesia Preprocedure Evaluation (Signed)
 Anesthesia Evaluation  Patient identified by MRN, date of birth, ID band Patient awake    Reviewed: Allergy & Precautions, NPO status , Patient's Chart, lab work & pertinent test results, reviewed documented beta blocker date and time   History of Anesthesia Complications Negative for: history of anesthetic complications  Airway Mallampati: III  TM Distance: >3 FB Neck ROM: Limited    Dental no notable dental hx.    Pulmonary neg COPD, neg PE   breath sounds clear to auscultation       Cardiovascular hypertension, (-) angina + DOE  (-) CAD, (-) Past MI and (-) CABG (-) dysrhythmias (-) pacemaker Rhythm:Regular Rate:Normal     Neuro/Psych neg Seizures TIA   GI/Hepatic ,GERD  ,,(+) neg Cirrhosis        Endo/Other    Renal/GU Renal disease     Musculoskeletal  (+) Arthritis ,    Abdominal  (+) + obese  Peds  Hematology   Anesthesia Other Findings   Reproductive/Obstetrics                             Anesthesia Physical Anesthesia Plan  ASA: 2  Anesthesia Plan: General   Post-op Pain Management:    Induction: Intravenous  PONV Risk Score and Plan: 2 and Ondansetron and Dexamethasone  Airway Management Planned: Oral ETT  Additional Equipment:   Intra-op Plan:   Post-operative Plan: Extubation in OR  Informed Consent: I have reviewed the patients History and Physical, chart, labs and discussed the procedure including the risks, benefits and alternatives for the proposed anesthesia with the patient or authorized representative who has indicated his/her understanding and acceptance.     Dental advisory given  Plan Discussed with: CRNA  Anesthesia Plan Comments:        Anesthesia Quick Evaluation

## 2023-12-09 NOTE — Anesthesia Procedure Notes (Addendum)
 Procedure Name: Intubation Date/Time: 12/09/2023 8:43 AM  Performed by: Mariann Barter, MDPre-anesthesia Checklist: Patient identified, Emergency Drugs available, Suction available and Patient being monitored Patient Re-evaluated:Patient Re-evaluated prior to induction Oxygen Delivery Method: Circle system utilized Preoxygenation: Pre-oxygenation with 100% oxygen Induction Type: IV induction Ventilation: Mask ventilation without difficulty Laryngoscope Size: Miller and 2 Grade View: Grade I Tube type: Oral Tube size: 7.5 mm Number of attempts: 1 Airway Equipment and Method: Stylet and Oral airway Placement Confirmation: ETT inserted through vocal cords under direct vision, positive ETCO2 and breath sounds checked- equal and bilateral Secured at: 22 cm Tube secured with: Tape Dental Injury: Teeth and Oropharynx as per pre-operative assessment

## 2023-12-09 NOTE — H&P (Signed)
 PREOPERATIVE H&P  Chief Complaint: Bilateral leg weakness and numbness  HPI: [name] is a 73 y.o. female who presents with ongoing weakness and numbness in the bilateral legs  MRI reveals severe stenosis and instability at L4/5  Patient has failed multiple forms of conservative care and continues to have pain (see office notes for additional details regarding the patient's full course of treatment)  Past Medical History:  Diagnosis Date   Arthritis    Gastritis    GERD (gastroesophageal reflux disease)    HTN (hypertension)    Hyperlipidemia    TIA (transient ischemic attack) 2004   no residual   Past Surgical History:  Procedure Laterality Date   BIOPSY  10/28/2019   Procedure: BIOPSY;  Surgeon: West Bali, MD;  Location: [facility];  Service: Endoscopy;;  gastric    BIOPSY  05/22/2022   Procedure: BIOPSY;  Surgeon: Lanelle Bal, DO;  Location: [facility];  Service: Endoscopy;;   BREAST REDUCTION SURGERY     CHOLECYSTECTOMY N/A 02/29/2016   Procedure: LAPAROSCOPIC CHOLECYSTECTOMY;  Surgeon: Franky Macho, MD;  Location: [facility];  Service: General;  Laterality: N/A;   COLONOSCOPY  04/13/2006   WUJ:WJXBJY colon without evidence of polyps, masses, inflammatory changes diverticular or vascular ectasia/Normal retroflex view of the rectum   COLONOSCOPY WITH PROPOFOL N/A 10/09/2020   Surgeon: Lanelle Bal, DO; nonbleeding internal hemorrhoids, diverticulosis in the sigmoid and descending colon, 6 mm tubular adenoma.  Recommended repeat in 5 years.   ENDOSCOPIC RETROGRADE CHOLANGIOPANCREATOGRAPHY (ERCP) WITH PROPOFOL N/A 06/14/2020   Procedure: ENDOSCOPIC RETROGRADE CHOLANGIOPANCREATOGRAPHY (ERCP) WITH PROPOFOL;  Surgeon: Rachael Fee, MD;  Location: [facility];  Service: Endoscopy;  Laterality: N/A;   ESOPHAGOGASTRODUODENOSCOPY  2011   peptic stricture s/p dialtion, miuld gastritis likely NSAID related. No H. pylori   ESOPHAGOGASTRODUODENOSCOPY  N/A 10/28/2019   Procedure: ESOPHAGOGASTRODUODENOSCOPY (EGD);  Surgeon: West Bali, MD; Normal-appearing esophagus s/p empiric dilation, single gastric polyp resected and retrieved, mild gastritis s/p biopsy.  Pathology with fundic gland polyp.   ESOPHAGOGASTRODUODENOSCOPY (EGD) WITH PROPOFOL N/A 05/22/2022   Procedure: ESOPHAGOGASTRODUODENOSCOPY (EGD) WITH PROPOFOL;  Surgeon: Lanelle Bal, DO;  [facility];  Service: Endoscopy;  Laterality: N/A;  11:15 am   JOINT REPLACEMENT     POLYPECTOMY  10/09/2020   Procedure: POLYPECTOMY;  Surgeon: Lanelle Bal, DO;  [facility];  Service: Endoscopy;;   REPLACEMENT TOTAL KNEE Bilateral    SAVORY DILATION N/A 10/28/2019   Procedure: SAVORY DILATION;  Surgeon: West Bali, MD;  [facility]: [facility];  Service: Endoscopy;  Laterality: N/A;   SHOULDER ARTHROSCOPY WITH ROTATOR CUFF REPAIR AND SUBACROMIAL DECOMPRESSION Left 05/05/2019   Procedure: Left Shoulder Arthroscopy and Rotator Cuff Repair ACROMIOPLASTY;  Surgeon: Marcene Corning, MD;  Location: [facility];  Service: Orthopedics;  Laterality: Left;   SPHINCTEROTOMY  06/14/2020   Procedure: SPHINCTEROTOMY;  Surgeon: Rachael Fee, MD;  Location: [facility];  Service: Endoscopy;;  balloon sweep   Social History   Socioeconomic History   Marital status: Married    Spouse name: Not on file   Number of children: 2   Years of education: Not on file   Highest education level: Not on file  Occupational History   Occupation: [employer]    Employer: [employer]  Tobacco Use   Smoking status: Never   Smokeless tobacco: Never  Vaping Use   Vaping status: Never Used  Substance and Sexual Activity   Alcohol use: No   Drug use: No   Sexual activity: Not Currently  Other Topics Concern   Not on file  Social History Narrative   Not on file   Social Drivers of Health   Financial Resource Strain: Not on file   Food Insecurity: Not on file  Transportation Needs: Not on file  Physical Activity: Insufficiently Active (10/16/2020)   Received from [facility], [facility]   Exercise Vital Sign    Days of Exercise per Week: 2 days    Minutes of Exercise per Session: 30 min  Stress: Not on file  Social Connections: Not on file   Family History  Problem Relation Age of Onset   Congestive Heart Failure Mother    Diabetes Mother    Hypertension Mother    Diabetes Father    Osteoarthritis Father    Colon cancer Neg Hx    No Known Allergies Prior to Admission medications   Medication Sig Start Date End Date Taking? Authorizing Provider  Coenzyme Q10 (COQ10) 200 MG CAPS Take 200 mg by mouth at bedtime.   Yes [provider]  lisinopril-hydrochlorothiazide (ZESTORETIC) 20-25 MG tablet Take 1 tablet by mouth daily.   Yes [provider]  oxybutynin (DITROPAN) 5 MG tablet Take 5 mg by mouth at bedtime.   Yes [provider]  simvastatin (ZOCOR) 40 MG tablet Take 40 mg by mouth at bedtime.   Yes [provider]  aspirin 325 MG tablet Take 325 mg by mouth at bedtime.     [provider]  diclofenac Sodium (VOLTAREN) 1 % GEL Apply 1 Application topically daily as needed (pain).    [provider]  pantoprazole (PROTONIX) 40 MG tablet Take 1 tablet (40 mg total) by mouth 2 (two) times daily before a meal. Patient taking differently: Take 40 mg by mouth daily as needed. 05/22/22 12/01/23  Lanelle Bal, DO     All other systems have been reviewed and were otherwise negative with the exception of those mentioned in the HPI and as above.  Physical Exam: Vitals:   12/09/23 0633  BP: (!) 119/59  Pulse: 85  Resp: 18  Temp: 98.2 F (36.8 C)  SpO2: 99%    Body mass index is 40.45 kg/m.  General: Alert, no acute distress Cardiovascular: No pedal edema Respiratory: No cyanosis, no use of accessory musculature Skin: No lesions in the  area of chief complaint Neurologic: Sensation intact distally Psychiatric: Patient is competent for consent with normal mood and affect Lymphatic: No axillary or cervical lymphadenopathy  Assessment/Plan: SPINAL STENOSIS AND INSTABILITY L4/5 Plan for Procedure(s): LEFT-SIDED LUMBAR 4- LUMBAR 5 TRANSFORAMINAL LUMBAR INTERBODY FUSION AND DECOMPRESSION WITH INSTRUMENTATION AND ALLOGRAFT   Jackelyn Hoehn, MD 12/09/2023 8:09 AM

## 2023-12-09 NOTE — Op Note (Signed)
 PATIENT NAME: [name]   MEDICAL RECORD NO.:   [id number]   DATE OF BIRTH: [birth year]   DATE OF PROCEDURE: 12/09/2023                               OPERATIVE REPORT     PREOPERATIVE DIAGNOSES: 1. Bilateral leg weakness and numbness 2. Severe L4-5 spinal stenosis 3. L4-5 spondylolisthesis   POSTOPERATIVE DIAGNOSES: 1. Bilateral leg weakness and numbness 2. Severe L4-5 spinal stenosis 3. L4-5 spondylolisthesis   PROCEDURES: 1. L4/5 decompression 2. Left-sided L4-5 transforaminal lumbar interbody fusion. 3. Right-sided L4-5 posterolateral fusion. 4. Insertion of interbody device x1 (Globus expandable intervertebral spacer). 5. Placement of segmental posterior instrumentation L4, L5 bilaterally  6. Use of local autograft. 7. Use of morselized allograft - Vivigen 8. Intraoperative use of fluoroscopy.   SURGEON:  Estill Bamberg, MD.   ASSISTANTJason Coop, PA-C.   ANESTHESIA:  General endotracheal anesthesia.   COMPLICATIONS:  None.   DISPOSITION:  Stable.   ESTIMATED BLOOD LOSS:  100cc   INDICATIONS FOR SURGERY:  Briefly,  [name] is a pleasant 73 -year-old female, who did present to me with and progressive weakness and numbness in the right and left legs. I did feel that the symptoms were secondary to the findings noted above.   The patient failed conservative care and did wish to proceed with the procedure  noted above.   OPERATIVE DETAILS:  On 12/09/2023, the patient was brought to surgery and general endotracheal anesthesia was administered.  The patient was placed prone on a well-padded flat Jackson bed with a spinal frame.  Antibiotics were given and a time-out procedure was performed. The back was prepped and draped in the usual fashion.  A midline incision was made overlying the L4-5 intervertebral spaces.  The fascia was incised at the midline.  The paraspinal musculature was bluntly swept laterally.  Anatomic landmarks for the pedicles were  exposed. Using fluoroscopy, I did cannulate the L4 and L5 pedicles bilaterally, using a medial to lateral cortical trajectory technique.  At this point, 6 x 30 mm screws were placed into the right pedicles, and a 35 mm rod was placed into the tulip heads of the screws, and caps were also placed.  Distraction was then applied across the L4-5 intervertebral space, and the caps were then provisionally tightened.  On the left side, bone wax was placed into the cannulated pedicle holes.  I then proceeded with the decompressive aspect of the procedure at the L4-5 level.  A partial facetectomy was performed bilaterally at L4-5, decompressing the L4-5 intervertebral space.  Of note, the stenosis was quite severe.  I was however very pleased with the decompression. With an assistant holding medial retraction of the traversing left L5 nerve, I did perform an annulotomy at the posterolateral aspect of the L4-5 intervertebral space.  I then used a series of curettes and pituitary rongeurs to perform a thorough and complete intervertebral diskectomy.  The intervertebral space was then liberally packed with autograft as well as allograft in the form of Vivigen, as was the appropriate-sized intervertebral spacer.  The spacer was then tamped into position in the usual fashion, and expanded to 8.7 mm in height. I was very pleased with the press-fit of the spacer.  I then placed 6 mm screws on the left at L4 and L5. A 35-mm rod was then placed and caps were placed. The distraction was then  released on the contralateral side.  All caps were then locked.  The wound was copiously irrigated with a total of approximately 3 L prior to placing the bone graft.  At this point, the right sided L4-5 facet joint, and posterolateral gutter, including the right L4, and right L5 transverse processes, were decorticated using a high-speed bur.  Additional autograft and allograft was then packed into the posterolateral gutter on the left  side to help aid in the success of the fusion.  The wound was  explored for any undue bleeding and there was no substantial bleeding encountered.  I did however elect to place a #15 deep Blake drain, deep to the fascia.  This was secured using a 2-0 nylon stitch on the skin.  Gel-Foam was placed over the laminectomy site.  The wound was then closed in layers using #1 Vicryl followed by 2-0 Vicryl, followed by 4-0 Monocryl.  Benzoin and Steri-Strips were applied followed by sterile dressing.     Of note, Jason Coop was my assistant throughout surgery, and did aid in retraction, suctioning, the decompression, placement of the hardware, and closure.     Estill Bamberg, MD

## 2023-12-09 NOTE — Anesthesia Postprocedure Evaluation (Signed)
 Anesthesia Post Note  Patient: [name]  Procedure(s) Performed: LEFT-SIDED LUMBAR 4- LUMBAR 5 TRANSFORAMINAL LUMBAR INTERBODY FUSION AND DECOMPRESSION WITH INSTRUMENTATION AND ALLOGRAFT (Left: Spine Lumbar)     Patient location during evaluation: PACU Anesthesia Type: General Level of consciousness: awake and alert Pain management: pain level controlled Vital Signs Assessment: post-procedure vital signs reviewed and stable Respiratory status: spontaneous breathing, nonlabored ventilation, respiratory function stable and patient connected to nasal cannula oxygen Cardiovascular status: blood pressure returned to baseline and stable Postop Assessment: no apparent nausea or vomiting Anesthetic complications: no   There were no known notable events for this encounter.  Last Vitals:  Vitals:   12/09/23 1415 12/09/23 1506  BP: (!) 110/57 (!) 90/59  Pulse: 89 86  Resp: 19 20  Temp:  36.6 C  SpO2: 93% 100%    Last Pain:  Vitals:   12/09/23 1523  TempSrc:   PainSc: 6                  Mariann Barter

## 2023-12-09 NOTE — Transfer of Care (Cosign Needed)
 Immediate Anesthesia Transfer of Care Note  Patient: [name]  Procedure(s) Performed: LEFT-SIDED LUMBAR 4- LUMBAR 5 TRANSFORAMINAL LUMBAR INTERBODY FUSION AND DECOMPRESSION WITH INSTRUMENTATION AND ALLOGRAFT (Left: Spine Lumbar)  Patient Location: PACU  Anesthesia Type:General  Level of Consciousness: drowsy but responsive    Airway & Oxygen Therapy: Patient Spontanous Breathing and Patient connected to face mask oxygen  Post-op Assessment: Report given to RN and Post -op Vital signs reviewed and stable  Post vital signs: Reviewed and stable  Last Vitals:  Vitals Value Taken Time  BP 107/47 12/09/23 1254  Temp 36.7 C 12/09/23 1254  Pulse 92 12/09/23 1257  Resp 26 12/09/23 1257  SpO2 97 % 12/09/23 1257  Vitals shown include unfiled device data.  Last Pain:  Vitals:   12/09/23 1254  PainSc: 0-No pain         Complications: There were no known notable events for this encounter.

## 2023-12-10 DIAGNOSIS — M4316 Spondylolisthesis, lumbar region: Secondary | ICD-10-CM | POA: Diagnosis not present

## 2023-12-10 NOTE — Progress Notes (Signed)
 Patient alert and oriented, void, ambulate. Surgical site clean and dry no sign of infection. D/c instructions explain and given to the patient and family. Patient d/c home with RW per order.

## 2023-12-10 NOTE — Care Management Obs Status (Signed)
 MEDICARE OBSERVATION STATUS NOTIFICATION   Patient Details  Name: [name] MRN: [id number] Date of Birth: 08-09-1951   Medicare Observation Status Notification Given:  Yes    Sherilyn Banker 12/10/2023, 4:53 PM

## 2023-12-10 NOTE — Progress Notes (Signed)
    Patient doing well  Has been ambulating   Physical Exam: Vitals:   12/09/23 2245 12/10/23 0346  BP: (!) 111/59 (!) 101/57  Pulse: 95 88  Resp: 20 20  Temp: 99.2 F (37.3 C) 98.7 F (37.1 C)  SpO2: 95% 97%    Dressing in place NVI  Drain output: minimal  POD #1 s/p L4/5 decompression and fusion, doing well  - up with PT/OT, encourage ambulation - Percocet for pain, tizanidine for muscle spasms - d/c home today with f/u in 2 weeks - d/c drain

## 2023-12-10 NOTE — Discharge Instructions (Signed)
   DISCHARGE INSTRUCTIONS LUMBAR FUSION  INCISION Keep your bandage on for 5 days following your surgery, then slowly remove it.  You may note small "steri-strips" overlying the incision.  Let these fall off on their own (usually at 2-3 weeks).    If any of the below should occur, please call the office. - Persistent drainage from incisional site - Opening of incisions - Fever greater than 101 F - Flu-like symptoms - Increased redness and/or tenderness    BRACE If you were given a brace, you'll need to wear it when you're out of bed and active.  Always wear a T-shirt under your brace so that it isn't in contact with your bare skin. The brace may cause you to sweat and you may feel warm; this can irritate your incision so pay special attention to the above "incision" instructions.  SHOWERING Please keep the bandage and wound dry for a total of 5 days.  You may shower during this time as long as the wound stays dry.  After 5 days you can get the wound wet.  No tub baths, hot tubs or whirlpools until seen in the office.   EXERCISE - Do not lift objects weighing more than than 10 lbs - Do not bend or twist at the waist-always bend your knees!! - Limit your upright sitting to 30 minute intervals.  You should lie down or walk in between sitting periods.  - There are no limitations for sitting in a recliner chair. - Walk as much as possible, and let discomfort be your guide.  Walking is highly encouraged and will expedite your overall recovery.   - You may also go up and down stairs as much as you can tolerate.     PAIN Take pain medication as prescribed. As your pain level decreases, you may begin to take over-the-counter Extra Strength Tylenol.  DO NOT take any anti-inflammatories (Motrin, Advil, etc.) for 10 weeks after surgery (anti-inflammatories can interfere with fusion healing).  If you previously took Fosamax, do not resume taking it until 12 weeks after your fusion  surgery.  DRIVING  You may not drive a car until told otherwise by Dr. Yevette Edwards (usually at your first office visit).  You may be a passenger for short distances (20-30 minutes).  If you must take a longer trip, make sure to make several pit stops so that you can walk around and stretch your legs.  Reclining the passenger seat seems to be the most comfortable position for most patients.          FOLLOW-UP APPOINTMENT You will be given an appointment card with a follow-up appointment date and time.  If you did not, or if it gets misplaced, call [phone].

## 2023-12-10 NOTE — Evaluation (Signed)
 Occupational Therapy Evaluation Patient Details Name: [name] MRN: [id number] DOB: Oct 16, 1950 Today's Date: 12/10/2023   History of Present Illness   Pt is a 73 y/o female who presents s/p L4-L5 TLIF on 12/09/2023. PMH significant for HTN, TIA, B TKA.     Clinical Impressions Pt evaluated s/p the admission list above. At baseline, pt lives at home with her husband, is her husband's caregiver, and completes all ADLs/IADLs and functional mobility independently. Upon evaluation, pt was limited by pain at incision site, headache, low BP, and knowledge of compensatory strategies to adhere to back precautions. Overall, pt required up to MIN A and increased time for all aspects of functional mobility using RW. Based on evaluation, pt will require up to MOD A for ADLs. Pt BP monitored throughout session and with positioning changes. Pt did not become orthostatic but did report dizziness after completing STS from bed. Pt reported dizziness subsided while standing. OT will continue following acutely to address functional needs with discharge recommendation of follow-up HHOT service  to ensure safe discharge and maximize functional independence.   95/61 (71)-supine 98/61 (71)-seated 91/55 (62)- standing *symptomatic* 99/59 (68)- after 2 minutes activity (after walking to bathroom and completing clothing management) 101/52 (67) standing after walking from bathroom   If plan is discharge home, recommend the following:   A little help with walking and/or transfers;A lot of help with bathing/dressing/bathroom;Assistance with cooking/housework;Assist for transportation;Help with stairs or ramp for entrance     Functional Status Assessment   Patient has had a recent decline in their functional status and demonstrates the ability to make significant improvements in function in a reasonable and predictable amount of time.     Equipment Recommendations   None recommended by OT      Recommendations for Other Services         Precautions/Restrictions   Precautions Precautions: Fall;Back Precaution Booklet Issued: Yes (comment) Recall of Precautions/Restrictions: Intact Precaution/Restrictions Comments: Reviewed precautions with pt and daughters Required Braces or Orthoses: Spinal Brace Spinal Brace: Thoracolumbosacral orthotic;Applied in sitting position Restrictions Weight Bearing Restrictions Per Provider Order: No     Mobility Bed Mobility Overal bed mobility: Needs Assistance Bed Mobility: Rolling, Sidelying to Sit Rolling: Min assist, Used rails Sidelying to sit: Used rails, Min assist       General bed mobility comments: Increased time and verbal cues for sequencing to use log roll technique. Pt required assistance for elevation of trunk.    Transfers Overall transfer level: Needs assistance Equipment used: Rolling walker (2 wheels) Transfers: Sit to/from Stand Sit to Stand: Min assist           General transfer comment: MIN A to stand and verbal cues for hand placement. Bed elevated to increase pt independence.      Balance Overall balance assessment: Needs assistance Sitting-balance support: Single extremity supported, Feet supported Sitting balance-Leahy Scale: Fair Sitting balance - Comments: Requires at least 1 UE support to maintain sitting balance. Pain contributing to balance and discomfort sitting upright at EOB   Standing balance support: Single extremity supported, During functional activity, No upper extremity supported Standing balance-Leahy Scale: Fair Standing balance comment: completed clothing management and hand hygiene while standing unsupported                           ADL either performed or assessed with clinical judgement   ADL Overall ADL's : Needs assistance/impaired Eating/Feeding: Independent   Grooming: Wash/dry hands;Standing;Contact guard assist  Upper Body Bathing: Supervision/  safety;Sitting   Lower Body Bathing: Moderate assistance;Cueing for back precautions;Adhering to back precautions;Sitting/lateral leans;Sit to/from stand   Upper Body Dressing : Minimal assistance;Sitting   Lower Body Dressing: Moderate assistance;Sit to/from stand   Toilet Transfer: Minimal assistance;Ambulation;Regular Toilet;Rolling walker (2 wheels);Cueing for safety   Toileting- Clothing Manipulation and Hygiene: Minimal assistance;Adhering to back precautions;Sit to/from stand       Functional mobility during ADLs: Minimal assistance;Rolling walker (2 wheels) General ADL Comments: Pt reported having a headache and increased pain at incision site. Pt required increased time and verbal cues to adhere to back precautions functionally. Pt BP monitored with positioning changes; did not become orthostatic during session.     Vision Baseline Vision/History: 0 No visual deficits Ability to See in Adequate Light: 0 Adequate Patient Visual Report: No change from baseline Vision Assessment?: No apparent visual deficits     Perception Perception: Within Functional Limits       Praxis Praxis: Not tested       Pertinent Vitals/Pain Pain Assessment Pain Assessment: Faces Faces Pain Scale: Hurts even more Pain Location: Incision site; headache Pain Descriptors / Indicators: Operative site guarding, Sore, Discomfort, Aching Pain Intervention(s): Limited activity within patient's tolerance, Monitored during session     Extremity/Trunk Assessment Upper Extremity Assessment Upper Extremity Assessment: Overall WFL for tasks assessed   Lower Extremity Assessment Lower Extremity Assessment: Defer to PT evaluation   Cervical / Trunk Assessment Cervical / Trunk Assessment: Back Surgery   Communication Communication Communication: No apparent difficulties   Cognition Arousal: Alert Behavior During Therapy: WFL for tasks assessed/performed Cognition: No apparent impairments                                Following commands: Intact       Cueing  General Comments   Cueing Techniques: Verbal cues;Tactile cues  VSS on RA; daughters at bedside; BP 101/52 (67) at the end of therapy session   Exercises     Shoulder Instructions      Home Living Family/patient expects to be discharged to:: Private residence Living Arrangements: Spouse/significant other Available Help at Discharge: Family;Available 24 hours/day Type of Home: House Home Access: Ramped entrance     Home Layout: One level     Bathroom Shower/Tub: Producer, television/film/video: Handicapped height     Home Equipment: Cane - single point;Toilet riser;Shower seat          Prior Functioning/Environment Prior Level of Function : Independent/Modified Independent;Driving             Mobility Comments: No AD ADLs Comments: Does all cooking/cleaning/shopping/errands as husband with CHF and pt reports he no longer leaves the house.    OT Problem List: Decreased strength;Decreased activity tolerance;Impaired balance (sitting and/or standing);Decreased knowledge of use of DME or AE;Decreased knowledge of precautions;Decreased safety awareness   OT Treatment/Interventions: Self-care/ADL training;Therapeutic exercise;DME and/or AE instruction;Energy conservation;Therapeutic activities;Balance training;Patient/family education      OT Goals(Current goals can be found in the care plan section)   Acute Rehab OT Goals Patient Stated Goal: to feel better OT Goal Formulation: With patient/family Time For Goal Achievement: 12/24/23 Potential to Achieve Goals: Good ADL Goals Additional ADL Goal #1: Pt will complete all ADLs with MOD I   OT Frequency:  Min 1X/week    Co-evaluation              AM-PAC OT "6 Clicks"  Daily Activity     Outcome Measure Help from another person eating meals?: None Help from another person taking care of personal grooming?: A Little Help  from another person toileting, which includes using toliet, bedpan, or urinal?: A Little Help from another person bathing (including washing, rinsing, drying)?: A Lot Help from another person to put on and taking off regular upper body clothing?: A Little Help from another person to put on and taking off regular lower body clothing?: A Lot 6 Click Score: 17   End of Session Equipment Utilized During Treatment: Rolling walker (2 wheels);Gait belt Nurse Communication: Mobility status;Other (comment) (BP)  Activity Tolerance: Patient limited by pain;Patient limited by fatigue Patient left: in bed;with call bell/phone within reach;with family/visitor present  OT Visit Diagnosis: Unsteadiness on feet (R26.81);Other abnormalities of gait and mobility (R26.89);Muscle weakness (generalized) (M62.81)                Time: 8469-6295 OT Time Calculation (min): 38 min Charges:  OT General Charges $OT Visit: 1 Visit OT Evaluation $OT Eval Moderate Complexity: 1 Mod OT Treatments $Self Care/Home Management : 8-22 mins $Therapeutic Activity: 8-22 mins  [address], MOTS  Kalik Hoare 12/10/2023, 2:06 PM

## 2023-12-10 NOTE — TOC Transition Note (Signed)
 Transition of Care [facility]) - Discharge Note   Patient Details  Name: [name] MRN: [id number] Date of Birth: 12-23-1950  [facility] CM/SW Contact:  Kermit Balo, RN Phone Number: 12/10/2023, 10:09 AM   Clinical Narrative:     Pt is discharging home with [facility]. Information on the AVS. [facility] will contact her for the first home visit. Pt has transportation home.   Final next level of care: Home w Home Health Services Barriers to Discharge: No Barriers Identified   Patient Goals and CMS Choice   CMS Medicare.gov Compare Post Acute Care list provided to:: Patient Choice offered to / list presented to : Patient      Discharge Placement                       Discharge Plan and Services Additional resources added to the After Visit Summary for                            [facility] Arranged: PT HH Agency: [facility] Date [facility] Agency Contacted: 12/10/23   Representative spoke with at [facility] Agency: [facility]--Angela  Social Drivers of Health (SDOH) Interventions SDOH Screenings   Physical Activity: Insufficiently Active (10/16/2020)   Received from [facility]  Tobacco Use: Low Risk  (12/09/2023)  Health Literacy: Low Risk  (12/02/2023)   Received from [facility]     Readmission Risk Interventions     No data to display

## 2023-12-10 NOTE — Evaluation (Signed)
 Physical Therapy Evaluation  Patient Details Name: [name] MRN: [id number] DOB: November 05, 1950 Today's Date: 12/10/2023  History of Present Illness  Pt is a 73 y/o female who presents s/p L4-L5 TLIF on 12/09/2023. PMH significant for HTN, TIA, B TKA.  Clinical Impression  Pt admitted with above diagnosis. At the time of PT eval, pt was able to demonstrate transfers and ambulation with gross min A to CGA and RW for support. Pt was educated on precautions, brace application/wearing schedule, and appropriate activity progression. Of note, pt symptomatically orthostatic and mobility limited due to this. See below for details. Pt currently with functional limitations due to the deficits listed below (see PT Problem List). Pt will benefit from skilled PT to increase their independence and safety with mobility to allow discharge to the venue listed below.      Orthostatic BPs  Supine 88/39!  Supine HOB 27 101/52  Supine HOB 45 100/53  Sitting  99/56  Sitting after 5 min 92/58!  Sitting after 10 min 98/56  Standing 81/70!  Standing after walking to the bathroom 99/59  End of session HOB 26 100/46         If plan is discharge home, recommend the following: A little help with walking and/or transfers;A little help with bathing/dressing/bathroom;Assistance with cooking/housework;Assist for transportation;Help with stairs or ramp for entrance   Can travel by private vehicle        Equipment Recommendations Rolling walker (2 wheels)  Recommendations for Other Services       Functional Status Assessment Patient has had a recent decline in their functional status and demonstrates the ability to make significant improvements in function in a reasonable and predictable amount of time.     Precautions / Restrictions Precautions Precautions: Fall;Back Precaution Booklet Issued: Yes (comment) Recall of Precautions/Restrictions: Intact Precaution/Restrictions Comments: Reviewed handout and  pt was cued for precautions during functional mobility. Required Braces or Orthoses: Spinal Brace Spinal Brace: Thoracolumbosacral orthotic;Applied in sitting position Restrictions Weight Bearing Restrictions Per Provider Order: No      Mobility  Bed Mobility Overal bed mobility: Needs Assistance Bed Mobility: Rolling, Sidelying to Sit, Sit to Sidelying Rolling: Contact guard assist, Used rails Sidelying to sit: Min assist, HOB elevated, Used rails     Sit to sidelying: Mod assist, Used rails General bed mobility comments: Assist for trunk elevation to full sitting position, as well as for LE elevation back up into bed at end of session.    Transfers Overall transfer level: Needs assistance Equipment used: Rolling walker (2 wheels) Transfers: Sit to/from Stand Sit to Stand: Min assist           General transfer comment: Assist for power up to full standing as well as for balance support and safety.    Ambulation/Gait Ambulation/Gait assistance: Contact guard assist, Min assist Gait Distance (Feet): 15 Feet Assistive device: Rolling walker (2 wheels) Gait Pattern/deviations: Step-through pattern, Decreased stride length, Trunk flexed Gait velocity: Decreased Gait velocity interpretation: 1.31 - 2.62 ft/sec, indicative of limited community ambulator   General Gait Details: VC's for improved posture, closer walker proximity and forward gaze. Occasional assist for walker management. To bathroom and back to bed only due to orthostatics.  Stairs            Wheelchair Mobility     Tilt Bed    Modified Rankin (Stroke Patients Only)       Balance Overall balance assessment: Needs assistance, Mild deficits observed, not formally tested Sitting-balance support: Single  extremity supported Sitting balance-Leahy Scale: Poor Sitting balance - Comments: Requires at least 1 UE support to maintain sitting balance.   Standing balance support: Single extremity supported,  During functional activity, Reliant on assistive device for balance Standing balance-Leahy Scale: Poor Standing balance comment: Needs at least 1 UE support.                             Pertinent Vitals/Pain Pain Assessment Pain Assessment: Faces Faces Pain Scale: Hurts even more Pain Location: Incision site Pain Descriptors / Indicators: Operative site guarding, Sore Pain Intervention(s): Limited activity within patient's tolerance, Monitored during session, Repositioned    Home Living Family/patient expects to be discharged to:: Private residence Living Arrangements: Spouse/significant other Available Help at Discharge: Family;Available 24 hours/day (Husband but cannot physically assist. Daughter and grandson for short time upon d/c.) Type of Home: House Home Access: Ramped entrance       Home Layout: One level Home Equipment: Cane - single point;Toilet riser;Shower seat      Prior Function Prior Level of Function : Independent/Modified Independent;Driving             Mobility Comments: No AD ADLs Comments: Does all cooking/cleaning/shopping/errands as husband with CHF and pt reports he no longer leaves the house.     Extremity/Trunk Assessment   Upper Extremity Assessment Upper Extremity Assessment: Defer to OT evaluation    Lower Extremity Assessment Lower Extremity Assessment: Generalized weakness    Cervical / Trunk Assessment Cervical / Trunk Assessment: Back Surgery  Communication   Communication Communication: Impaired Factors Affecting Communication: Hearing impaired    Cognition Arousal: Alert Behavior During Therapy: WFL for tasks assessed/performed   PT - Cognitive impairments: No apparent impairments                         Following commands: Intact       Cueing Cueing Techniques: Verbal cues, Gestural cues     General Comments      Exercises     Assessment/Plan    PT Assessment Patient needs continued PT  services  PT Problem List Decreased strength;Decreased activity tolerance;Decreased balance;Decreased mobility;Decreased knowledge of use of DME;Decreased safety awareness;Decreased knowledge of precautions;Pain       PT Treatment Interventions DME instruction;Functional mobility training;Gait training;Therapeutic activities;Therapeutic exercise;Balance training;Patient/family education    PT Goals (Current goals can be found in the Care Plan section)  Acute Rehab PT Goals Patient Stated Goal: Home today PT Goal Formulation: With patient/family Time For Goal Achievement: 12/17/23 Potential to Achieve Goals: Good    Frequency Min 5X/week     Co-evaluation               AM-PAC PT "6 Clicks" Mobility  Outcome Measure Help needed turning from your back to your side while in a flat bed without using bedrails?: A Little Help needed moving from lying on your back to sitting on the side of a flat bed without using bedrails?: A Little Help needed moving to and from a bed to a chair (including a wheelchair)?: A Little Help needed standing up from a chair using your arms (e.g., wheelchair or bedside chair)?: A Little Help needed to walk in hospital room?: A Little Help needed climbing 3-5 steps with a railing? : A Lot 6 Click Score: 17    End of Session Equipment Utilized During Treatment: Gait belt;Back brace Activity Tolerance: Patient tolerated treatment well Patient left:  in bed;with call bell/phone within reach;with family/visitor present Nurse Communication: Mobility status PT Visit Diagnosis: Unsteadiness on feet (R26.81);Pain Pain - part of body:  (back)    Time: 9604-5409 PT Time Calculation (min) (ACUTE ONLY): 62 min   Charges:   PT Evaluation $PT Eval Moderate Complexity: 1 Mod PT Treatments $Gait Training: 8-22 mins $Therapeutic Activity: 23-37 mins PT General Charges $$ ACUTE PT VISIT: 1 Visit         [name], PT, DPT Acute Rehabilitation  Services Secure Chat Preferred Office: [phone]   [name] 12/10/2023, 10:11 AM

## 2023-12-14 ENCOUNTER — Encounter (HOSPITAL_COMMUNITY): Payer: Self-pay | Admitting: Orthopedic Surgery

## 2023-12-30 NOTE — Discharge Summary (Signed)
 Patient ID: [name] MRN: [id number] DOB/AGE: Sep 27, 1951 73 y.o.  Admit date: 12/09/2023 Discharge date: 12/10/2023  Admission Diagnoses:  Principal Problem:   Radiculopathy of lumbar region   Discharge Diagnoses:  Same  Past Medical History:  Diagnosis Date   Arthritis    Gastritis    GERD (gastroesophageal reflux disease)    HTN (hypertension)    Hyperlipidemia    TIA (transient ischemic attack) 2004   no residual    Surgeries: Procedure(s): LEFT-SIDED LUMBAR 4- LUMBAR 5 TRANSFORAMINAL LUMBAR INTERBODY FUSION AND DECOMPRESSION WITH INSTRUMENTATION AND ALLOGRAFT on 12/09/2023   Consultants: None  Discharged Condition: Improved  Hospital Course: [name] is an 73 y.o. female who was admitted 12/09/2023 for operative treatment of Radiculopathy of lumbar region. Patient has severe unremitting pain that affects sleep, daily activities, and work/hobbies. After pre-op clearance the patient was taken to the operating room on 12/09/2023 and underwent  Procedure(s): LEFT-SIDED LUMBAR 4- LUMBAR 5 TRANSFORAMINAL LUMBAR INTERBODY FUSION AND DECOMPRESSION WITH INSTRUMENTATION AND ALLOGRAFT.    Patient was given perioperative antibiotics:  Anti-infectives (From admission, onward)    Start     Dose/Rate Route Frequency Ordered Stop   12/09/23 1530  ceFAZolin (ANCEF) IVPB 2g/100 mL premix        2 g 200 mL/hr over 30 Minutes Intravenous Every 8 hours 12/09/23 1440 12/10/23 0905   12/09/23 0700  ceFAZolin (ANCEF) IVPB 2g/100 mL premix        2 g 200 mL/hr over 30 Minutes Intravenous On call to O.R. 12/09/23 8469 12/09/23 0915        Patient was given sequential compression devices, early ambulation to prevent DVT.  Patient benefited maximally from hospital stay and there were no complications.    Recent vital signs: BP (!) 112/50 (BP Location: Left Arm)   Pulse 85   Temp 98.1 F (36.7 C) (Oral)   Resp 18   Ht 5\' 8"  (1.727 m)   Wt 120.7 kg   SpO2 99%   BMI 40.45  kg/m    Discharge Medications:   Allergies as of 12/10/2023   No Known Allergies      Medication List     TAKE these medications    aspirin 325 MG tablet Take 325 mg by mouth at bedtime.   CoQ10 200 MG Caps Take 200 mg by mouth at bedtime.   diclofenac Sodium 1 % Gel Commonly known as: VOLTAREN Apply 1 Application topically daily as needed (pain).   lisinopril-hydrochlorothiazide 20-25 MG tablet Commonly known as: ZESTORETIC Take 1 tablet by mouth daily.   oxybutynin 5 MG tablet Commonly known as: DITROPAN Take 5 mg by mouth at bedtime.   oxyCODONE-acetaminophen 5-325 MG tablet Commonly known as: PERCOCET/ROXICET Take 1-2 tablets by mouth every 4 (four) hours as needed for severe pain (pain score 7-10).   pantoprazole 40 MG tablet Commonly known as: Protonix Take 1 tablet (40 mg total) by mouth 2 (two) times daily before a meal. What changed:  when to take this reasons to take this   simvastatin 40 MG tablet Commonly known as: ZOCOR Take 40 mg by mouth at bedtime.   tizanidine 2 MG capsule Commonly known as: ZANAFLEX Take 1 capsule (2 mg total) by mouth every 6 (six) hours as needed for muscle spasms.        Diagnostic Studies: DG Lumbar Spine 2-3 Views Result Date: 12/09/2023 CLINICAL DATA:  73 year old female undergoing lumbar surgery. EXAM: LUMBAR SPINE - 2-3 VIEW COMPARISON:  Intraoperative radiograph 0859 hours today and earlier. FINDINGS: 2 intraoperative fluoroscopic spot views of the lower lumbar spine demonstrating posterior and interbody fusion hardware at L4-L5. Fluoroscopy time: 56 seconds.  59.9 mGy. IMPRESSION: Intraoperative fluoroscopy during L4-L5 posterior and interbody fusion. Electronically Signed   By: Odessa Fleming M.D.   On: 12/09/2023 12:52   DG Lumbar Spine 1 View Result Date: 12/09/2023 CLINICAL DATA:  73 year old female undergoing lumbar surgery. EXAM: LUMBAR SPINE - 1 VIEW COMPARISON:  Lumbar MRI 09/12/2023. FINDINGS: Intraoperative  portable cross-table lateral view of the lumbar spine at 0859 hours. Lumbar segmentation does appear to be normal on this image. Chronic anterolisthesis redemonstrated at L4-L5. Exaggerated lumbar lordosis. Posterior needles are in place projecting to the L4 pedicle and L5 spinous process or pedicle levels. IMPRESSION: Intraoperative localization at the L4 and L5 levels. Electronically Signed   By: Odessa Fleming M.D.   On: 12/09/2023 12:50   DG C-Arm 1-60 Min-No Report Result Date: 12/09/2023 Fluoroscopy was utilized by the requesting physician.  No radiographic interpretation.   DG C-Arm 1-60 Min-No Report Result Date: 12/09/2023 Fluoroscopy was utilized by the requesting physician.  No radiographic interpretation.   DG C-Arm 1-60 Min-No Report Result Date: 12/09/2023 Fluoroscopy was utilized by the requesting physician.  No radiographic interpretation.   DG C-Arm 1-60 Min-No Report Result Date: 12/09/2023 Fluoroscopy was utilized by the requesting physician.  No radiographic interpretation.    Disposition: Discharge disposition: 01-Home or Self Care        POD #1 s/p L4/5 decompression and fusion, doing well   - up with PT/OT, encourage ambulation - Percocet for pain, tizanidine for muscle spasms -Scripts for pain sent to pharmacy electronically  -D/C instructions sheet printed and in chart -D/C today  -F/U in office 2 weeks   Signed: Eilene Ghazi Toni Demo 12/30/2023, 10:24 AM

## 2024-01-20 ENCOUNTER — Encounter: Payer: Self-pay | Admitting: Gastroenterology

## 2024-03-14 ENCOUNTER — Ambulatory Visit
Admission: EM | Admit: 2024-03-14 | Discharge: 2024-03-14 | Disposition: A | Discharge status: Home / Self Care | Attending: Family Medicine | Admitting: Family Medicine

## 2024-03-14 DIAGNOSIS — L600 Ingrowing nail: Secondary | ICD-10-CM

## 2024-03-14 DIAGNOSIS — L089 Local infection of the skin and subcutaneous tissue, unspecified: Secondary | ICD-10-CM | POA: Diagnosis not present

## 2024-03-14 DIAGNOSIS — L97519 Non-pressure chronic ulcer of other part of right foot with unspecified severity: Secondary | ICD-10-CM | POA: Diagnosis not present

## 2024-03-14 MED ORDER — DOXYCYCLINE HYCLATE 100 MG PO CAPS: 100.0000 mg | ORAL_CAPSULE | Freq: Two times a day (BID) | ORAL | 0 refills | Status: DC

## 2024-03-14 NOTE — ED Triage Notes (Signed)
 Per daughter the second toe on her right foot is red, swollen, and black.

## 2024-03-14 NOTE — ED Provider Notes (Signed)
 [facility]    CSN: [id number] Arrival date & time: 03/14/24  1712      History   Chief Complaint No chief complaint on file.   HPI [name] is a 73 y.o. female.   Patient presenting today for evaluation of 1 day history of a wound to the second right toe.  States the toe is painful, red, swollen with some black.  She states she was clipping the nail off with her home clippers the other day and attempts to remove the toenail fungus and the entire nail came off.  Denies bleeding, drainage, fever, chills, decreased range of motion, numbness, tingling.  So far trying Neosporin and a sock with minimal relief.    Past Medical History:  Diagnosis Date   Arthritis    Gastritis    GERD (gastroesophageal reflux disease)    HTN (hypertension)    Hyperlipidemia    TIA (transient ischemic attack) 2004   no residual    Patient Active Problem List   Diagnosis Date Noted   Radiculopathy of lumbar region 12/09/2023   RUQ pain 01/27/2022   Bloating 01/27/2022   Poor appetite 01/27/2022   Abdominal wall defect, acquired 01/27/2022   Rectal itching 08/24/2020   Colon cancer screening 08/24/2020   Diarrhea 02/22/2020   Abdominal pain, epigastric 07/29/2019   Elevated alkaline phosphatase level 07/29/2019   Acute cholecystitis 02/27/2016   DOE (dyspnea on exertion) 12/12/2013   Stiffness of joint, lower leg 09/10/2011   Bilateral leg weakness 09/10/2011   S/P TKR (total knee replacement) 08/10/2011   Hypertension 08/10/2011   Dyslipidemia 08/10/2011   Gastroesophageal reflux disease 08/10/2011   Lower urinary tract infectious disease 08/10/2011   OTHER DYSPHAGIA 05/21/2010   CEREBROVASCULAR ACCIDENT, HX OF 05/17/2010    Past Surgical History:  Procedure Laterality Date   BIOPSY  10/28/2019   Procedure: BIOPSY;  Surgeon: Alyce Jubilee, MD;  Location: [facility];  Service: Endoscopy;;  gastric    BIOPSY  05/22/2022   Procedure: BIOPSY;  Surgeon: Vinetta Greening, DO;  Location: [facility];  Service: Endoscopy;;   BREAST REDUCTION SURGERY     CHOLECYSTECTOMY N/A 02/29/2016   Procedure: LAPAROSCOPIC CHOLECYSTECTOMY;  Surgeon: Alanda Allegra, MD;  Location: [facility];  Service: General;  Laterality: N/A;   COLONOSCOPY  04/13/2006   JXB:JYNWGN colon without evidence of polyps, masses, inflammatory changes diverticular or vascular ectasia/Normal retroflex view of the rectum   COLONOSCOPY WITH PROPOFOL  N/A 10/09/2020   Surgeon: Vinetta Greening, DO; nonbleeding internal hemorrhoids, diverticulosis in the sigmoid and descending colon, 6 mm tubular adenoma.  Recommended repeat in 5 years.   ENDOSCOPIC RETROGRADE CHOLANGIOPANCREATOGRAPHY (ERCP) WITH PROPOFOL  N/A 06/14/2020   Procedure: ENDOSCOPIC RETROGRADE CHOLANGIOPANCREATOGRAPHY (ERCP) WITH PROPOFOL ;  Surgeon: Janel Medford, MD;  Location: [facility];  Service: Endoscopy;  Laterality: N/A;   ESOPHAGOGASTRODUODENOSCOPY  2011   peptic stricture s/p dialtion, miuld gastritis likely NSAID related. No H. pylori   ESOPHAGOGASTRODUODENOSCOPY N/A 10/28/2019   Procedure: ESOPHAGOGASTRODUODENOSCOPY (EGD);  Surgeon: Alyce Jubilee, MD; Normal-appearing esophagus s/p empiric dilation, single gastric polyp resected and retrieved, mild gastritis s/p biopsy.  Pathology with fundic gland polyp.   ESOPHAGOGASTRODUODENOSCOPY (EGD) WITH PROPOFOL  N/A 05/22/2022   Procedure: ESOPHAGOGASTRODUODENOSCOPY (EGD) WITH PROPOFOL ;  Surgeon: Vinetta Greening, DO;  Location: [facility];  Service: Endoscopy;  Laterality: N/A;  11:15 am   JOINT REPLACEMENT     POLYPECTOMY  10/09/2020   Procedure: POLYPECTOMY;  Surgeon: Vinetta Greening,  DO;  Location: [facility];  Service: Endoscopy;;   REPLACEMENT TOTAL KNEE Bilateral    SAVORY DILATION N/A 10/28/2019   Procedure: SAVORY DILATION;  Surgeon: Alyce Jubilee, MD;  Location: [facility];  Service: Endoscopy;  Laterality: N/A;   SHOULDER ARTHROSCOPY WITH ROTATOR CUFF  REPAIR AND SUBACROMIAL DECOMPRESSION Left 05/05/2019   Procedure: Left Shoulder Arthroscopy and Rotator Cuff Repair ACROMIOPLASTY;  Surgeon: Dayne Even, MD;  Location: [facility];  Service: Orthopedics;  Laterality: Left;   SPHINCTEROTOMY  06/14/2020   Procedure: SPHINCTEROTOMY;  Surgeon: Janel Medford, MD;  Location: [facility];  Service: Endoscopy;;  balloon sweep   TRANSFORAMINAL LUMBAR INTERBODY FUSION (TLIF) WITH PEDICLE SCREW FIXATION 1 LEVEL Left 12/09/2023   Procedure: LEFT-SIDED LUMBAR 4- LUMBAR 5 TRANSFORAMINAL LUMBAR INTERBODY FUSION AND DECOMPRESSION WITH INSTRUMENTATION AND ALLOGRAFT;  Surgeon: Virl Grimes, MD;  Location: [facility];  Service: Orthopedics;  Laterality: Left;    OB History   No obstetric history on file.      Home Medications    Prior to Admission medications   Medication Sig Start Date End Date Taking? Authorizing Provider  doxycycline  (VIBRAMYCIN ) 100 MG capsule Take 1 capsule (100 mg total) by mouth 2 (two) times daily. 03/14/24  Yes Corbin Dess, PA-C  aspirin  325 MG tablet Take 325 mg by mouth at bedtime.     [provider]  Coenzyme Q10 (COQ10) 200 MG CAPS Take 200 mg by mouth at bedtime.    [provider]  diclofenac Sodium (VOLTAREN) 1 % GEL Apply 1 Application topically daily as needed (pain).    [provider]  lisinopril -hydrochlorothiazide  (ZESTORETIC ) 20-25 MG tablet Take 1 tablet by mouth daily.    [provider]  oxybutynin  (DITROPAN ) 5 MG tablet Take 5 mg by mouth at bedtime.    [provider]  oxyCODONE -acetaminophen  (PERCOCET/ROXICET) 5-325 MG tablet Take 1-2 tablets by mouth every 4 (four) hours as needed for severe pain (pain score 7-10). 12/09/23   Virl Grimes, MD  pantoprazole  (PROTONIX ) 40 MG tablet Take 1 tablet (40 mg total) by mouth 2 (two) times daily before a meal. Patient taking differently: Take 40 mg by mouth daily as needed. 05/22/22 12/01/23  Vinetta Greening, DO  simvastatin  (ZOCOR ) 40 MG tablet Take 40 mg by mouth at bedtime.    [provider]  tizanidine  (ZANAFLEX ) 2 MG capsule Take 1 capsule (2 mg total) by mouth every 6 (six) hours as needed for muscle spasms. 12/09/23   Virl Grimes, MD    Family History Family History  Problem Relation Age of Onset   Congestive Heart Failure Mother    Diabetes Mother    Hypertension Mother    Diabetes Father    Osteoarthritis Father    Colon cancer Neg Hx     Social History Social History   Tobacco Use   Smoking status: Never   Smokeless tobacco: Never  Vaping Use   Vaping status: Never Used  Substance Use Topics   Alcohol use: No   Drug use: No     Allergies   Patient has no known allergies.   Review of Systems Review of Systems PER HPI  Physical Exam Triage Vital Signs ED Triage Vitals  Encounter Vitals Group     BP 03/14/24 1738 120/76     Systolic BP Percentile --      Diastolic BP Percentile --      Pulse Rate 03/14/24 1738 73  Resp 03/14/24 1738 16     Temp 03/14/24 1738 98.3 F (36.8 C)     Temp Source 03/14/24 1738 Oral     SpO2 03/14/24 1738 95 %     Weight --      Height --      Head Circumference --      Peak Flow --      Pain Score 03/14/24 1739 5     Pain Loc --      Pain Education --      Exclude from Growth Chart --    No data found.  Updated Vital Signs BP 120/76 (BP Location: Right Arm)   Pulse 73   Temp 98.3 F (36.8 C) (Oral)   Resp 16   SpO2 95%   Visual Acuity Right Eye Distance:   Left Eye Distance:   Bilateral Distance:    Right Eye Near:   Left Eye Near:    Bilateral Near:     Physical Exam Vitals and nursing note reviewed.  Constitutional:      Appearance: Normal appearance. She is not ill-appearing.  HENT:     Head: Atraumatic.  Eyes:     Extraocular Movements: Extraocular movements intact.     Conjunctiva/sclera: Conjunctivae normal.  Cardiovascular:     Rate and Rhythm: Normal rate.   Pulmonary:     Effort: Pulmonary effort is normal.  Musculoskeletal:        General: Normal range of motion.     Cervical back: Normal range of motion and neck supple.  Skin:    General: Skin is warm.     Comments: Absent toenail to the right second toe.  Discolored ulceration to the distal aspect of the right second toe, no active drainage or bleeding.  Also has an ingrown toenail to the right great toe  Neurological:     Mental Status: She is alert and oriented to person, place, and time.     Comments: Right foot neurovascularly intact  Psychiatric:        Mood and Affect: Mood normal.        Thought Content: Thought content normal.        Judgment: Judgment normal.      UC Treatments / Results  Labs (all labs ordered are listed, but only abnormal results are displayed) Labs Reviewed - No data to display  EKG   Radiology No results found.  Procedures Procedures (including critical care time)  Medications Ordered in UC Medications - No data to display  Initial Impression / Assessment and Plan / UC Course  I have reviewed the triage vital signs and the nursing notes.  Pertinent labs & imaging results that were available during my care of the patient were reviewed by me and considered in my medical decision making (see chart for details).     Will place on doxycycline , discussed home wound care and dressing applied today.  Close follow-up with podiatry recommended going forward for monitoring.  Warm Epsom salt soaks, supportive shoes that do not crowd the toes Final Clinical Impressions(s) / UC Diagnoses   Final diagnoses:  Skin ulcer of second toe, right, with unspecified severity (HCC)  Toe infection  Ingrown toenail     Discharge Instructions      Clean the area at least once a day with soap and water  or Hibiclens  solution, apply mupirocin ointment and a dressing.  Try to avoid friction from your shoe as this will irritate the area more.  Elevate  the leg at  rest to help with swelling.  Take the full course of antibiotics and follow-up with primary care and podiatry as soon as possible.  ED Prescriptions     Medication Sig Dispense Auth. Provider   doxycycline  (VIBRAMYCIN ) 100 MG capsule Take 1 capsule (100 mg total) by mouth 2 (two) times daily. 20 capsule Corbin Dess, [place]      PDMP not reviewed this encounter.   Corbin Dess, [place] 03/14/24 [phone]

## 2024-03-14 NOTE — Discharge Instructions (Signed)
 Clean the area at least once a day with soap and water  or Hibiclens  solution, apply mupirocin ointment and a dressing.  Try to avoid friction from your shoe as this will irritate the area more.  Elevate the leg at rest to help with swelling.  Take the full course of antibiotics and follow-up with primary care and podiatry as soon as possible.

## 2024-03-16 ENCOUNTER — Ambulatory Visit: Admitting: Podiatry

## 2024-03-16 ENCOUNTER — Ambulatory Visit (INDEPENDENT_AMBULATORY_CARE_PROVIDER_SITE_OTHER)

## 2024-03-16 ENCOUNTER — Encounter: Payer: Self-pay | Admitting: Podiatry

## 2024-03-16 DIAGNOSIS — M79674 Pain in right toe(s): Secondary | ICD-10-CM

## 2024-03-16 DIAGNOSIS — L97511 Non-pressure chronic ulcer of other part of right foot limited to breakdown of skin: Secondary | ICD-10-CM

## 2024-03-16 DIAGNOSIS — B351 Tinea unguium: Secondary | ICD-10-CM

## 2024-03-16 DIAGNOSIS — L089 Local infection of the skin and subcutaneous tissue, unspecified: Secondary | ICD-10-CM

## 2024-03-16 DIAGNOSIS — M79675 Pain in left toe(s): Secondary | ICD-10-CM

## 2024-03-17 ENCOUNTER — Telehealth: Payer: Self-pay | Admitting: Podiatry

## 2024-03-17 NOTE — Telephone Encounter (Signed)
 Patient called- states she was told that an antibiotic would be sent in after yesterdays appointment- pharmacy does not have record of a prescription. Can we send to [facility] in patients chart?

## 2024-03-17 NOTE — Progress Notes (Signed)
 Subjective:   Patient ID: [name], female   DOB: 73 y.o.   MRN: [id number]   HPI Patient presents with caregiver with a second nail that is red and has started to drain a little bit and has become painful over the last week.  She does not remember injury but she is on her feet all the time taking care of her husband and she is moderately obese.  Does not smoke tries to be active   Review of Systems  All other systems reviewed and are negative.       Objective:  Physical Exam Vitals and nursing note reviewed.  Constitutional:      Appearance: She is well-developed.  Pulmonary:     Effort: Pulmonary effort is normal.   Musculoskeletal:        General: Normal range of motion.   Skin:    General: Skin is warm.   Neurological:     Mental Status: She is alert.     Neurovascular status was found to be intact she has elongated second digit bilateral with distal keratotic lesion with slight odor localized with redness of the digit itself not extending past the MPJ currently.  Also has elongated nailbed 1-5 both feet that are thick dystrophic and it is impossible for her to cut and she may have prediabetes they are not sure about that.  Does have good digital perfusion well-oriented     Assessment:  Probability for ulceration superficial of the right second digit with the possibility of bone infection and mycotic nail infection 1-5 both feet     Plan:  H&P all conditions reviewed and at this point using sharp sterile debridement I debrided the second digit distal and I did note slight fat exposure no muscle no bone exposure no tendon exposure there is a small amount of drainage localized and patient has been on doxycycline  which has not not been effective for the last few days.  I debrided nailbeds 1-5 both feet no iatrogenic bleeding I applied sterile dressing to the right second toe and dispensed surgical shoe.  I placed on Septra  twice daily currently and I gave strict  instructions for soaks offloading the area and wearing the surgical shoe along with wet-to-dry dressings.  Patient will be seen back to recheck and x-rays reviewed  X-rays at this time were negative for signs of osteo as far as we can tell no indication of osteolysis.  Gave strict instructions if redness of the toe were to intensify or into the foot she is to go straight to the emergency room and she does run risk of amputation of this digit

## 2024-03-18 ENCOUNTER — Encounter: Payer: Self-pay | Admitting: Podiatry

## 2024-03-21 ENCOUNTER — Other Ambulatory Visit: Payer: Self-pay

## 2024-03-21 NOTE — Telephone Encounter (Signed)
Sent med in 

## 2024-03-21 NOTE — Telephone Encounter (Signed)
 She should have received the septra 

## 2024-03-22 ENCOUNTER — Other Ambulatory Visit: Payer: Self-pay | Admitting: Podiatry

## 2024-03-22 MED ORDER — SULFAMETHOXAZOLE-TRIMETHOPRIM 800-160 MG PO TABS: 1.0000 | ORAL_TABLET | Freq: Two times a day (BID) | ORAL | 0 refills | Status: DC

## 2024-03-22 NOTE — Progress Notes (Signed)
 Sent antibiotic as it was not received.

## 2024-03-22 NOTE — Telephone Encounter (Signed)
 I had Dr. Clydia Dart send in the Rx for Septra  to [facility] in [place]. Called and spoke to [place] and informed her it was called in.

## 2024-03-31 ENCOUNTER — Telehealth: Payer: Self-pay | Admitting: Podiatry

## 2024-03-31 NOTE — Telephone Encounter (Signed)
 Patient called requesting a refill on the antibiotic Bactrim  DS as she states the procedure site is still red and she believes it's still infected. Please advise, thanks

## 2024-04-01 ENCOUNTER — Other Ambulatory Visit: Payer: Self-pay | Admitting: Podiatry

## 2024-04-01 MED ORDER — SULFAMETHOXAZOLE-TRIMETHOPRIM 800-160 MG PO TABS: 1.0000 | ORAL_TABLET | Freq: Two times a day (BID) | ORAL | 1 refills | Status: AC

## 2024-04-01 NOTE — Telephone Encounter (Signed)
I SENT IT IN

## 2024-04-05 NOTE — Progress Notes (Unsigned)
 GI Office Note    Referring Provider: Sheryle Carwin, MD Primary Care Physician:  Sheryle Carwin, MD Primary Gastroenterologist: Carlin POUR. Cindie, DO  Date:  04/07/2024  ID:  [name], DOB [birth year], MRN [id number]  Chief Complaint   Chief Complaint  Patient presents with   Follow-up    Follow up. No problems    History of Present Illness  [name] is a 73 y.o. female with a history of choledocholithiasis s/p ERCP 06/2020, fatty liver, GERD, dysphagia with dilation in 2021, intermittent diarrhea 2/2 dietary intolerances, hemorrhoids, adenomatous colon polyps presenting today for follow up.   Colonoscopy January 2022: non bleeding internal hemorrhoids, sigmoid and descending diverticulosis, 6mm tubular adenoma. Repeat in 5 years.    Seen in April for postprandial heaviness/bloating in RUQ. Only using omeprazole  as needed previously but taking daily since her symptoms began. Gastritis suspected due to NSAIDs. Reported decreased appetite and 8lb weight loss . Noted to have hernia above umbilicus or diastasis recti. CT if symptoms worsen, omeprazole  daily, and update labs. Celiac screen negative. CBC and lipase unremarkable. CMP with elevated alk phos. Recommended to repeat LFTs, GTT, and AMA in 3 months.    CT A/P 04/30/22 with borderline cardiomegaly, evidence of cholecystectomy, common hepatic duct 10 mm, CBD 6mm, equivocal wall thickening in the CBD, pancreas and spleen unremarkable. Few scattered colonic diverticula. Lumbar foraminal impingement.    MR/MRCP 05/02/22 with diffuse hepatic steatosis, common hepatic duct 11 mm and CBD 6mm with gentle tapering of the CBD without choledocholithiasis and no evidence of biliary ductal wall thickening or peribiliary enhancement, fatty pancreatic atrophy without ductal dilation. Scheduled for EGD to further evaluate abdominal pain.    EGD 05/22/22: normal esophagus and duodenum. Gastritis s/p biopsy with minimal vascular ectasia without h.  pylori. Advised omeprazole  40 mg BID for 3 months then daily and avoid NSAIDs.    OV 08/04/22.  Some mild intermittent loose stools if eating spicy foods or eating too quickly.  Usually only about once per month.  Appetite at baseline.  Does admit to eating a fair amount of fried foods.  More abdominal pain or abdominal fullness.  Taking pantoprazole  twice daily.  No breakthrough symptoms.  Trying to avoid NSAIDs, currently seeing chiropractor due to back pain.  Denied any melena, BRBPR, fever, constipation.  After 2 weeks.  Follow GERD diet.  Avoid NSAIDs.  Follow low-sodium diet..   OV 01/15/23.  Reported issues with a UTI previously and was on Keflex , Macrobid and also felt she had a yeast infection.  GERD doing okay at times, sometimes she feels like she cannot breathe past her stomach, sleeping in a recliner at night and at times unable to get comfortable.  But denies any shortness of breath.  Reportedly taking pantoprazole  40 mg once daily and appetite doing okay.  Had reported some GI upset after eating fish just before Easter.  Also having intermittent diarrhea which she suspected was due to her antibiotics.  Denied any melena or BRBPR.  Was going to discuss her A1c with her PCP.  Advised continue PPI once daily, famotidine nightly if needed, follow low-fat and GERD diet.  Encouraged her to discuss her blood pressure medication with her primary care provider, as her blood pressure was low.   Last office visit 08/13/23.  Recent life stressors.  Reflux doing okay, does have problems with eating red meat at times but tries to avoid it.  Has hard time swallowing anything that is dry.  Taking  PPI once daily, sometimes feels like this gives her loose stools but she is unsure.  Did report some dizziness and lightheadedness but BP okay at home.  Denied any syncope, presyncope, chest pain, shortness of breath, lack of appetite, early satiety, melena, BRBPR, or constipation.  Advised her to further discuss her  blood pressure with Dr. Sheryle.  Continue pantoprazole  once daily, GERD diet, dysphagia precautions given and advised to avoid refined sugars.  We discussed that if her dysphagia symptoms worsened or were becoming more frequent that we could consider repeat EGD with dilation.  Today:  She had a 2 week period recently she wasn't able to swallow anything but this week she has been okay. Denies indigestion but was just unable to get it down. Prior to that even she was doing fine.   Had to take something after eating taco salad. Once a week she may have a meal out but most of the time she eats at home and fixes whatever she knows to eat but trying to cater more to her husband given his medical issues.   Had back surgery in March.   No issues with constipation or diarrhea, no rectal bleeding.   Wt Readings from Last 3 Encounters:  04/07/24 266 lb 6.4 oz (120.8 kg)  12/09/23 266 lb (120.7 kg)  12/01/23 266 lb (120.7 kg)    Current Outpatient Medications  Medication Sig Dispense Refill   aspirin  325 MG tablet Take 325 mg by mouth at bedtime.      Coenzyme Q10 (COQ10) 200 MG CAPS Take 200 mg by mouth at bedtime.     diclofenac Sodium (VOLTAREN) 1 % GEL Apply 1 Application topically daily as needed (pain).     lisinopril -hydrochlorothiazide  (ZESTORETIC ) 20-25 MG tablet Take 1 tablet by mouth daily.     oxybutynin  (DITROPAN ) 5 MG tablet Take 5 mg by mouth at bedtime.     pantoprazole  (PROTONIX ) 40 MG tablet Take 1 tablet (40 mg total) by mouth 2 (two) times daily before a meal. 60 tablet 11   simvastatin  (ZOCOR ) 40 MG tablet Take 40 mg by mouth at bedtime.     sulfamethoxazole -trimethoprim  (BACTRIM  DS) 800-160 MG tablet Take 1 tablet by mouth 2 (two) times daily. 14 tablet 0   sulfamethoxazole -trimethoprim  (BACTRIM  DS) 800-160 MG tablet Take 1 tablet by mouth 2 (two) times daily. 20 tablet 1   tizanidine  (ZANAFLEX ) 2 MG capsule Take 1 capsule (2 mg total) by mouth every 6 (six) hours as needed for  muscle spasms. 24 capsule 2   oxyCODONE -acetaminophen  (PERCOCET/ROXICET) 5-325 MG tablet Take 1-2 tablets by mouth every 4 (four) hours as needed for severe pain (pain score 7-10). (Patient not taking: Reported on 04/07/2024) 30 tablet 0   No current facility-administered medications for this visit.    Past Medical History:  Diagnosis Date   Arthritis    Gastritis    GERD (gastroesophageal reflux disease)    HTN (hypertension)    Hyperlipidemia    TIA (transient ischemic attack) 2004   no residual    Past Surgical History:  Procedure Laterality Date   BIOPSY  10/28/2019   Procedure: BIOPSY;  Surgeon: Harvey Margo CROME, MD;  [facility];  Service: Endoscopy;;  gastric    BIOPSY  05/22/2022   Procedure: BIOPSY;  Surgeon: Cindie Carlin POUR, DO;  [facility];  Service: Endoscopy;;   BREAST REDUCTION SURGERY     CHOLECYSTECTOMY N/A 02/29/2016   Procedure: LAPAROSCOPIC CHOLECYSTECTOMY;  Surgeon: Oneil Budge, MD;  Location: [facility];  Service: General;  Laterality: N/A;   COLONOSCOPY  04/13/2006   DOQ:Wnmfjo colon without evidence of polyps, masses, inflammatory changes diverticular or vascular ectasia/Normal retroflex view of the rectum   COLONOSCOPY WITH PROPOFOL  N/A 10/09/2020   Surgeon: Cindie Carlin POUR, DO; nonbleeding internal hemorrhoids, diverticulosis in the sigmoid and descending colon, 6 mm tubular adenoma.  Recommended repeat in 5 years.   ENDOSCOPIC RETROGRADE CHOLANGIOPANCREATOGRAPHY (ERCP) WITH PROPOFOL  N/A 06/14/2020   Procedure: ENDOSCOPIC RETROGRADE CHOLANGIOPANCREATOGRAPHY (ERCP) WITH PROPOFOL ;  Surgeon: Teressa Toribio SQUIBB, MD;  Location: [facility];  Service: Endoscopy;  Laterality: N/A;   ESOPHAGOGASTRODUODENOSCOPY  2011   peptic stricture s/p dialtion, miuld gastritis likely NSAID related. No H. pylori   ESOPHAGOGASTRODUODENOSCOPY N/A 10/28/2019   Procedure: ESOPHAGOGASTRODUODENOSCOPY (EGD);  Surgeon: Harvey Margo CROME, MD; Normal-appearing  esophagus s/p empiric dilation, single gastric polyp resected and retrieved, mild gastritis s/p biopsy.  Pathology with fundic gland polyp.   ESOPHAGOGASTRODUODENOSCOPY (EGD) WITH PROPOFOL  N/A 05/22/2022   Procedure: ESOPHAGOGASTRODUODENOSCOPY (EGD) WITH PROPOFOL ;  Surgeon: Cindie Carlin POUR, DO;  [facility];  Service: Endoscopy;  Laterality: N/A;  11:15 am   JOINT REPLACEMENT     POLYPECTOMY  10/09/2020   Procedure: POLYPECTOMY;  Surgeon: Cindie Carlin POUR, DO;  [facility];  Service: Endoscopy;;   REPLACEMENT TOTAL KNEE Bilateral    SAVORY DILATION N/A 10/28/2019   Procedure: SAVORY DILATION;  Surgeon: Harvey Margo CROME, MD;  [facility];  Service: Endoscopy;  Laterality: N/A;   SHOULDER ARTHROSCOPY WITH ROTATOR CUFF REPAIR AND SUBACROMIAL DECOMPRESSION Left 05/05/2019   Procedure: Left Shoulder Arthroscopy and Rotator Cuff Repair ACROMIOPLASTY;  Surgeon: Sheril Coy, MD;  Location: [facility];  Service: Orthopedics;  Laterality: Left;   SPHINCTEROTOMY  06/14/2020   Procedure: SPHINCTEROTOMY;  Surgeon: Teressa Toribio SQUIBB, MD;  Location: [facility];  Service: Endoscopy;;  balloon sweep   TRANSFORAMINAL LUMBAR INTERBODY FUSION (TLIF) WITH PEDICLE SCREW FIXATION 1 LEVEL Left 12/09/2023   Procedure: LEFT-SIDED LUMBAR 4- LUMBAR 5 TRANSFORAMINAL LUMBAR INTERBODY FUSION AND DECOMPRESSION WITH INSTRUMENTATION AND ALLOGRAFT;  Surgeon: Beuford Anes, MD;  Location: [facility];  Service: Orthopedics;  Laterality: Left;    Family History  Problem Relation Age of Onset   Congestive Heart Failure Mother    Diabetes Mother    Hypertension Mother    Diabetes Father    Osteoarthritis Father    Colon cancer Neg Hx     Allergies as of 04/07/2024   (No Known Allergies)    Social History   Socioeconomic History   Marital status: Married    Spouse name: Not on file   Number of children: 2   Years of education: Not on file   Highest education level: Not  on file  Occupational History   Occupation: [employer]    Employer: [employer]  Tobacco Use   Smoking status: Never   Smokeless tobacco: Never  Vaping Use   Vaping status: Never Used  Substance and Sexual Activity   Alcohol use: No   Drug use: No   Sexual activity: Not Currently  Other Topics Concern   Not on file  Social History Narrative   Not on file   Social Drivers of Health   Financial Resource Strain: Not on file  Food Insecurity: Not on file  Transportation Needs: Not on file  Physical Activity: Insufficiently Active (10/16/2020)   Received from [facility]   Exercise Vital Sign  On average, how many days per week do you engage in moderate to strenuous exercise (like a brisk walk)?: 2 days    On average, how many minutes do you engage in exercise at this level?: 30 min  Stress: Not on file  Social Connections: Not on file   Review of Systems   Gen: Denies fever, chills, anorexia. Denies fatigue, weakness, weight loss.  CV: Denies chest pain, palpitations, syncope, peripheral edema, and claudication. Resp: Denies dyspnea at rest, cough, wheezing, coughing up blood, and pleurisy. GI: See HPI Derm: Denies rash, itching, dry skin Psych: Denies depression, anxiety, memory loss, confusion. No homicidal or suicidal ideation.  Heme: Denies bruising, bleeding, and enlarged lymph nodes.  Physical Exam   BP 99/60 (BP Location: Right Arm, Patient Position: Sitting, Cuff Size: Large)   Pulse 60   Temp 97.9 F (36.6 C) (Temporal)   Ht 5' 9 (1.753 m)   Wt 266 lb 6.4 oz (120.8 kg)   BMI 39.34 kg/m   General:   Alert and oriented. No distress noted. Pleasant and cooperative.  Head:  Normocephalic and atraumatic. Eyes:  Conjuctiva clear without scleral icterus. Mouth:  Oral mucosa pink and moist. Good dentition. No lesions. Rectal: deferred Msk:  Symmetrical without gross deformities. Normal posture. Extremities:  Wound to second toe  on right foot. Mild BLE edema.  Neurologic:  Alert and  oriented x4 Psych:  Alert and cooperative. Normal mood and affect.  Assessment  ERIEL DUNCKEL is a 73 y.o. female presenting today with no complaints.   GERD, dysphagia: Reflux symptoms fairly well-controlled with pantoprazole  40 mg twice daily.  Will have some occasional breakthrough symptoms secondary to dietary discretion.  This week swallowing has been an issue but before that for solid 2 weeks she was having a constant issue.  Prior to this since her last office visit swallowing has been okay overall.  We discussed that if her symptoms were to worsen or become more frequent that we could consider upper endoscopy with dilation otherwise we will continue PPI twice daily for now.  Reinforced GERD diet and dysphagia precautions today.  PLAN   EGD with dilation if worsening symptoms Continue pantoprazole  40 mg twice daily. Refilled today.  GERD diet Dysphagia precautions Follow-up in 1 year    Charmaine Melia, MSN, FNP-BC, AGACNP-BC [facility]

## 2024-04-07 ENCOUNTER — Encounter: Payer: Self-pay | Admitting: Gastroenterology

## 2024-04-07 ENCOUNTER — Ambulatory Visit: Admitting: Gastroenterology

## 2024-04-07 VITALS — BP 99/60 | HR 60 | Temp 97.9°F | Ht 69.0 in | Wt 266.4 lb

## 2024-04-07 DIAGNOSIS — K219 Gastro-esophageal reflux disease without esophagitis: Secondary | ICD-10-CM

## 2024-04-07 DIAGNOSIS — R1310 Dysphagia, unspecified: Secondary | ICD-10-CM | POA: Diagnosis not present

## 2024-04-07 MED ORDER — PANTOPRAZOLE SODIUM 40 MG PO TBEC: 40.0000 mg | DELAYED_RELEASE_TABLET | Freq: Two times a day (BID) | ORAL | 11 refills | Status: AC

## 2024-04-07 NOTE — Patient Instructions (Addendum)
 Continue pantoprazole  40 mg twice daily.   Follow a GERD diet:  Avoid fried, fatty, greasy, spicy, citrus foods. Avoid caffeine and carbonated beverages. Avoid chocolate. Try eating 4-6 small meals a day rather than 3 large meals. Do not eat within 3 hours of laying down. Prop head of bed up on wood or bricks to create a 6 inch incline.  If swallowing issues were worse let me know and we can consider EGD with dilation.   Follow up in 1 year.   It was a pleasure to see you today. I want to create trusting relationships with patients. If you receive a survey regarding your visit,  I greatly appreciate you taking time to fill this out on paper or through your MyChart. I value your feedback.  Charmaine Melia, MSN, FNP-BC, AGACNP-BC [facility]

## 2024-04-21 ENCOUNTER — Ambulatory Visit: Admitting: Podiatry

## 2024-04-21 ENCOUNTER — Encounter: Payer: Self-pay | Admitting: Podiatry

## 2024-04-21 ENCOUNTER — Ambulatory Visit (INDEPENDENT_AMBULATORY_CARE_PROVIDER_SITE_OTHER)

## 2024-04-21 VITALS — Ht 69.0 in | Wt 266.0 lb

## 2024-04-21 DIAGNOSIS — L089 Local infection of the skin and subcutaneous tissue, unspecified: Secondary | ICD-10-CM

## 2024-04-21 DIAGNOSIS — M2041 Other hammer toe(s) (acquired), right foot: Secondary | ICD-10-CM | POA: Diagnosis not present

## 2024-04-21 NOTE — Progress Notes (Signed)
 Subjective:   Patient ID: [name], female   DOB: 73 y.o.   MRN: [id number]   HPI Patient was concerned because she started to develop some redness again and she wanted to make sure it was okay and has not noted drainage and has been doing soaks   ROS      Objective:  Physical Exam  Neurovascular status intact with second toe being slightly red but localized no proximal edema edema drainage noted currently with crusted keratotic distal tissue noted no odor no active drainage pathology     Assessment:  Possibility for a subtle infective process versus a inflammatory process or healing process     Plan:  H&P reviewed discussed at great length debrided distal tissue courtesy did not note any drainage currently it appears to be healed and precautionary x-ray taken.  I did explain that I have not seen a change in her bone structure between the 2 visits but I cannot guarantee there is not bone infection ultimately may require amputation.  Offered MRI today she does not want to do this and we will continue to monitor and watch this and if it should change that may be necessary for amputation may be necessary

## 2024-06-13 ENCOUNTER — Other Ambulatory Visit: Payer: Self-pay

## 2024-06-13 ENCOUNTER — Encounter (HOSPITAL_BASED_OUTPATIENT_CLINIC_OR_DEPARTMENT_OTHER): Payer: Self-pay | Admitting: Orthopaedic Surgery

## 2024-06-13 NOTE — H&P (Signed)
 ORTHOPAEDIC SURGERY H&P  Subjective:  The patient presents for right foot 2nd toe amputation, possible ray amputation.   Past Medical History:  Diagnosis Date   Arthritis    Gastritis    GERD (gastroesophageal reflux disease)    HTN (hypertension)    Hyperlipidemia    TIA (transient ischemic attack) 2004   no residual    Past Surgical History:  Procedure Laterality Date   BIOPSY  10/28/2019   Procedure: BIOPSY;  Surgeon: Harvey Margo CROME, MD;  [facility];  Service: Endoscopy;;  gastric    BIOPSY  05/22/2022   Procedure: BIOPSY;  Surgeon: Cindie Carlin POUR, DO;  [facility];  Service: Endoscopy;;   BREAST REDUCTION SURGERY     CHOLECYSTECTOMY N/A 02/29/2016   Procedure: LAPAROSCOPIC CHOLECYSTECTOMY;  Surgeon: Oneil Budge, MD;  [facility]: [facility];  Service: General;  Laterality: N/A;   COLONOSCOPY  04/13/2006   DOQ:Wnmfjo colon without evidence of polyps, masses, inflammatory changes diverticular or vascular ectasia/Normal retroflex view of the rectum   COLONOSCOPY WITH PROPOFOL  N/A 10/09/2020   Surgeon: Cindie Carlin POUR, DO; nonbleeding internal hemorrhoids, diverticulosis in the sigmoid and descending colon, 6 mm tubular adenoma.  Recommended repeat in 5 years.   ENDOSCOPIC RETROGRADE CHOLANGIOPANCREATOGRAPHY (ERCP) WITH PROPOFOL  N/A 06/14/2020   Procedure: ENDOSCOPIC RETROGRADE CHOLANGIOPANCREATOGRAPHY (ERCP) WITH PROPOFOL ;  Surgeon: Teressa Toribio SQUIBB, MD;  [facility];  Service: Endoscopy;  Laterality: N/A;   ESOPHAGOGASTRODUODENOSCOPY  2011   peptic stricture s/p dialtion, miuld gastritis likely NSAID related. No H. pylori   ESOPHAGOGASTRODUODENOSCOPY N/A 10/28/2019   Procedure: ESOPHAGOGASTRODUODENOSCOPY (EGD);  Surgeon: Harvey Margo CROME, MD; Normal-appearing esophagus s/p empiric dilation, single gastric polyp resected and retrieved, mild gastritis s/p biopsy.  Pathology with fundic gland polyp.   ESOPHAGOGASTRODUODENOSCOPY (EGD) WITH  PROPOFOL  N/A 05/22/2022   Procedure: ESOPHAGOGASTRODUODENOSCOPY (EGD) WITH PROPOFOL ;  Surgeon: Cindie Carlin POUR, DO;  [facility];  Service: Endoscopy;  Laterality: N/A;  11:15 am   JOINT REPLACEMENT     POLYPECTOMY  10/09/2020   Procedure: POLYPECTOMY;  Surgeon: Cindie Carlin POUR, DO;  [facility];  Service: Endoscopy;;   REPLACEMENT TOTAL KNEE Bilateral    SAVORY DILATION N/A 10/28/2019   Procedure: SAVORY DILATION;  Surgeon: Harvey Margo CROME, MD;  [facility];  Service: Endoscopy;  Laterality: N/A;   SHOULDER ARTHROSCOPY WITH ROTATOR CUFF REPAIR AND SUBACROMIAL DECOMPRESSION Left 05/05/2019   Procedure: Left Shoulder Arthroscopy and Rotator Cuff Repair ACROMIOPLASTY;  Surgeon: Sheril Coy, MD;  Location: [facility];  Service: Orthopedics;  Laterality: Left;   SPHINCTEROTOMY  06/14/2020   Procedure: SPHINCTEROTOMY;  Surgeon: Teressa Toribio SQUIBB, MD;  Location: [facility];  Service: Endoscopy;;  balloon sweep   TRANSFORAMINAL LUMBAR INTERBODY FUSION (TLIF) WITH PEDICLE SCREW FIXATION 1 LEVEL Left 12/09/2023   Procedure: LEFT-SIDED LUMBAR 4- LUMBAR 5 TRANSFORAMINAL LUMBAR INTERBODY FUSION AND DECOMPRESSION WITH INSTRUMENTATION AND ALLOGRAFT;  Surgeon: Beuford Oneil, MD;  Location: [facility];  Service: Orthopedics;  Laterality: Left;     (Not in an outpatient encounter)    No Known Allergies  Social History   Socioeconomic History   Marital status: Married    Spouse name: Not on file   Number of children: 2   Years of education: Not on file   Highest education level: Not on file  Occupational History   Occupation: [employer]    Employer: [employer]  Tobacco Use   Smoking status: Never  Smokeless tobacco: Never  Vaping Use   Vaping status: Never Used  Substance and Sexual Activity   Alcohol use: No   Drug use: No   Sexual activity: Not Currently  Other Topics Concern   Not on file  Social History  Narrative   Not on file   Social Drivers of Health   Financial Resource Strain: Not on file  Food Insecurity: Not on file  Transportation Needs: Not on file  Physical Activity: Insufficiently Active (10/16/2020)   Received [facility]   Exercise Vital Sign    On average, how many days per week do you engage in moderate to strenuous exercise (like a brisk walk)?: 2 days    On average, how many minutes do you engage in exercise at this level?: 30 min  Stress: Not on file  Social Connections: Not on file  Intimate Partner Violence: Not At Risk (10/16/2020)   Received from [facility]   Humiliation, Afraid, Rape, and Kick questionnaire    Within the last year, have you been afraid of your partner or ex-partner?: No    Within the last year, have you been humiliated or emotionally abused in other ways by your partner or ex-partner?: No    Within the last year, have you been kicked, hit, slapped, or otherwise physically hurt by your partner or ex-partner?: No    Within the last year, have you been raped or forced to have any kind of sexual activity by your partner or ex-partner?: No     History reviewed. No pertinent family history.   Review of Systems Pertinent items are noted in HPI.  Objective: Vital signs in last 24 hours:    06/13/2024    3:30 PM 04/21/2024   11:54 AM 04/07/2024    3:46 PM  Vitals with BMI  Height 5' 9 5' 9 5' 9  Weight 262 lbs 266 lbs 266 lbs 6 oz  BMI 38.67 39.26 39.32  Systolic   99  Diastolic   60  Pulse   60      EXAM: General: Well nourished, well developed. Awake, alert and oriented to time, place, person. Normal mood and affect. No apparent distress. Breathing room air.  Operative Lower Extremity: Alignment - Neutral Deformity - None Skin intact Tenderness to palpation - right 2nd toe 5/5 TA, PT, GS, Per, EHL, FHL Sensation intact to light touch throughout Palpable DP and PT pulses Special testing: None  The contralateral  foot/ankle was examined for comparison and noted to be neurovascularly intact with no localized deformity, swelling, or tenderness.  Imaging Review All images taken were independently reviewed by me.  Assessment/Plan: The clinical and radiographic findings were reviewed and discussed at length with the patient.  The patient presents for right foot 2nd toe amputation, possible ray amputation.  We spoke at length about the natural course of these findings. We discussed nonoperative and operative treatment options in detail.  The risks and benefits were presented and reviewed. The risks due to suture/hardware failure/irritation (or if removing hardware inability to remove part/all of hardware, recurrent instability), new/persistent/recurrent infection, stiffness, nerve/vessel/tendon injury, nonunion/malunion of any fracture, wound healing issues, allograft usage, development of arthritis, failure of this surgery, possibility of external fixation in certain situations, possibility of delayed definitive surgery, need for further surgery, prolonged wound care including further soft tissue coverage procedures, thromboembolic events, anesthesia/medical complications/events perioperatively and beyond, amputation, death among others were discussed. The patient acknowledged the explanation and agreed to proceed with  the plan.  [name]  Orthopaedic Surgery [facility]

## 2024-06-13 NOTE — Discharge Instructions (Signed)
 Lillia Mountain, MD [facility]  Please read the following information regarding your care after surgery.  Medications  You only need a prescription for the narcotic pain medicine (ex. oxycodone , Percocet, Norco).  All of the other medicines listed below are available over the counter. ? acetaminophen  (Tylenol ) 500 mg every 4-6 hours as you need for minor to moderate pain  ? To help prevent blood clots, take aspirin  (81 mg) twice daily for 28 days after surgery.  You should also get up every hour while you are awake to move around.  Weight Bearing ? Once your nerve block is completely worn off, OK to HEEL weightbear in postop shoe on the operated leg or foot. This means do NOT touch the front of your surgical leg to the ground!  Cast / Splint / Dressing ? If you have a dressing, do NOT remove this. Keep your splint, cast or dressing clean and dry.  Don't put anything (coat hanger, pencil, etc) down inside of it.  If it gets wet, call the office immediately to schedule an appointment for a cast change.  Swelling IMPORTANT: It is normal for you to have swelling where you had surgery. To reduce swelling and pain, keep at least 3 pillows under your leg so that your toes are above your nose and your heel is above the level of your hip.  It may be necessary to keep your foot or leg elevated for several weeks.  This is critical to helping your incisions heal and your pain to feel better.  Follow Up Call my office at [phone] when you are discharged from the hospital or surgery center to schedule an appointment to be seen 7-10 days after surgery.  Call my office at [phone] if you develop a fever >101.5 F, nausea, vomiting, bleeding from the surgical site or severe pain.       Post Anesthesia Home Care Instructions  Activity: Get plenty of rest for the remainder of the day. A responsible individual must stay with you for 24 hours following the procedure.  For the next 24 hours, DO  NOT: -Drive a car -Advertising copywriter -Drink alcoholic beverages -Take any medication unless instructed by your physician -Make any legal decisions or sign important papers.  Meals: Start with liquid foods such as gelatin or soup. Progress to regular foods as tolerated. Avoid greasy, spicy, heavy foods. If nausea and/or vomiting occur, drink only clear liquids until the nausea and/or vomiting subsides. Call your physician if vomiting continues.  Special Instructions/Symptoms: Your throat may feel dry or sore from the anesthesia or the breathing tube placed in your throat during surgery. If this causes discomfort, gargle with warm salt water . The discomfort should disappear within 24 hours.  If you had a scopolamine  patch placed behind your ear for the management of post- operative nausea and/or vomiting:  1. The medication in the patch is effective for 72 hours, after which it should be removed.  Wrap patch in a tissue and discard in the trash. Wash hands thoroughly with soap and water . 2. You may remove the patch earlier than 72 hours if you experience unpleasant side effects which may include dry mouth, dizziness or visual disturbances. 3. Avoid touching the patch. Wash your hands with soap and water  after contact with the patch.      Next dose of Tylenol  may be given at 1:20pm if needed. Next dose of NSAIDs (Ibuprofen/Motrin/Aleve) may be given at 3:20pm if needed.

## 2024-06-14 ENCOUNTER — Encounter (HOSPITAL_BASED_OUTPATIENT_CLINIC_OR_DEPARTMENT_OTHER)
Admission: RE | Admit: 2024-06-14 | Discharge: 2024-06-14 | Disposition: A | Discharge status: Home / Self Care | Source: Ambulatory Visit | Attending: Orthopaedic Surgery

## 2024-06-14 DIAGNOSIS — M7741 Metatarsalgia, right foot: Secondary | ICD-10-CM | POA: Diagnosis present

## 2024-06-14 DIAGNOSIS — I10 Essential (primary) hypertension: Secondary | ICD-10-CM | POA: Diagnosis not present

## 2024-06-14 DIAGNOSIS — M869 Osteomyelitis, unspecified: Secondary | ICD-10-CM | POA: Diagnosis not present

## 2024-06-14 LAB — BASIC METABOLIC PANEL WITH GFR
Anion gap: 12 (ref 5–15)
BUN: 17 mg/dL (ref 8–23)
CO2: 24 mmol/L (ref 22–32)
Calcium: 9.5 mg/dL (ref 8.9–10.3)
Chloride: 102 mmol/L (ref 98–111)
Creatinine, Ser: 0.97 mg/dL (ref 0.44–1.00)
GFR, Estimated: >60 mL/min (ref >60)
Glucose, Bld: 125 mg/dL — ABNORMAL HIGH (ref 70–99)
Potassium: 3.8 mmol/L (ref 3.5–5.1)
Sodium: 138 mmol/L (ref 135–145)

## 2024-06-14 NOTE — Anesthesia Preprocedure Evaluation (Signed)
 Anesthesia Evaluation  Patient identified by MRN, date of birth, ID band Patient awake    Reviewed: Allergy & Precautions, NPO status , Patient's Chart, lab work & pertinent test results, reviewed documented beta blocker date and time   History of Anesthesia Complications Negative for: history of anesthetic complications  Airway        Dental   Pulmonary neg COPD, neg PE          Cardiovascular hypertension, (-) angina + DOE  (-) CAD, (-) Past MI and (-) CABG (-) dysrhythmias (-) pacemaker     Neuro/Psych neg Seizures TIA   GI/Hepatic ,GERD  ,,(+) neg Cirrhosis        Endo/Other    Renal/GU Renal disease     Musculoskeletal  (+) Arthritis ,    Abdominal  (+) + obese  Peds  Hematology   Anesthesia Other Findings   Reproductive/Obstetrics                              Anesthesia Physical Anesthesia Plan  ASA: 3  Anesthesia Plan: General   Post-op Pain Management: Tylenol  PO (pre-op)* and Celebrex  PO (pre-op)*   Induction: Intravenous  PONV Risk Score and Plan: 2 and Ondansetron  and Dexamethasone   Airway Management Planned: LMA  Additional Equipment: None  Intra-op Plan:   Post-operative Plan: Extubation in OR  Informed Consent: I have reviewed the patients History and Physical, chart, labs and discussed the procedure including the risks, benefits and alternatives for the proposed anesthesia with the patient or authorized representative who has indicated his/her understanding and acceptance.     Dental advisory given  Plan Discussed with: CRNA and Anesthesiologist  Anesthesia Plan Comments: ( )        Anesthesia Quick Evaluation

## 2024-06-15 ENCOUNTER — Encounter (HOSPITAL_BASED_OUTPATIENT_CLINIC_OR_DEPARTMENT_OTHER): Payer: Self-pay | Admitting: Orthopaedic Surgery

## 2024-06-15 ENCOUNTER — Encounter (HOSPITAL_BASED_OUTPATIENT_CLINIC_OR_DEPARTMENT_OTHER)
Admission: RE | Disposition: A | Discharge status: Home / Self Care | Payer: Self-pay | Source: Home / Self Care | Attending: Orthopaedic Surgery

## 2024-06-15 ENCOUNTER — Ambulatory Visit (HOSPITAL_BASED_OUTPATIENT_CLINIC_OR_DEPARTMENT_OTHER): Payer: Self-pay | Admitting: Anesthesiology

## 2024-06-15 ENCOUNTER — Other Ambulatory Visit: Payer: Self-pay

## 2024-06-15 ENCOUNTER — Ambulatory Visit (HOSPITAL_BASED_OUTPATIENT_CLINIC_OR_DEPARTMENT_OTHER)
Admission: RE | Admit: 2024-06-15 | Discharge: 2024-06-15 | Disposition: A | Discharge status: Home / Self Care | Attending: Orthopaedic Surgery | Admitting: Orthopaedic Surgery

## 2024-06-15 ENCOUNTER — Encounter (HOSPITAL_BASED_OUTPATIENT_CLINIC_OR_DEPARTMENT_OTHER): Payer: Self-pay | Admitting: Anesthesiology

## 2024-06-15 DIAGNOSIS — I10 Essential (primary) hypertension: Secondary | ICD-10-CM | POA: Insufficient documentation

## 2024-06-15 DIAGNOSIS — E785 Hyperlipidemia, unspecified: Secondary | ICD-10-CM

## 2024-06-15 DIAGNOSIS — Z01818 Encounter for other preprocedural examination: Secondary | ICD-10-CM

## 2024-06-15 DIAGNOSIS — M869 Osteomyelitis, unspecified: Secondary | ICD-10-CM | POA: Insufficient documentation

## 2024-06-15 HISTORY — PX: AMPUTATION TOE: SHX6595

## 2024-06-15 SURGERY — AMPUTATION, TOE
Anesthesia: General | Site: Toe | Laterality: Right

## 2024-06-15 MED ORDER — ONDANSETRON HCL 4 MG/2ML IJ SOLN: 4.0000 mg | Freq: Once | INTRAMUSCULAR | Status: DC | PRN

## 2024-06-15 MED ORDER — ACETAMINOPHEN 500 MG PO TABS
ORAL_TABLET | ORAL | Status: AC
  Filled 2024-06-15: qty 2

## 2024-06-15 MED ORDER — CELECOXIB 200 MG PO CAPS
ORAL_CAPSULE | ORAL | Status: AC
  Filled 2024-06-15: qty 1

## 2024-06-15 MED ORDER — DEXAMETHASONE SODIUM PHOSPHATE 10 MG/ML IJ SOLN
INTRAMUSCULAR | Status: AC
Start: 2024-06-15 — End: 2024-06-15
  Filled 2024-06-15: qty 1

## 2024-06-15 MED ORDER — CEFAZOLIN SODIUM-DEXTROSE 2-4 GM/100ML-% IV SOLN
INTRAVENOUS | Status: AC
Start: 2024-06-15 — End: 2024-06-15
  Filled 2024-06-15: qty 100

## 2024-06-15 MED ORDER — FENTANYL CITRATE (PF) 100 MCG/2ML IJ SOLN
INTRAMUSCULAR | Status: DC | PRN
  Administered 2024-06-15: 50 ug via INTRAVENOUS
  Administered 2024-06-15: 25 ug via INTRAVENOUS

## 2024-06-15 MED ORDER — PROPOFOL 10 MG/ML IV BOLUS
INTRAVENOUS | Status: DC | PRN
  Administered 2024-06-15: 150 mg via INTRAVENOUS
  Administered 2024-06-15: 50 mg via INTRAVENOUS

## 2024-06-15 MED ORDER — IRRISEPT - 450ML BOTTLE WITH 0.05% CHG IN STERILE WATER, USP 99.95% OPTIME
TOPICAL | Status: DC | PRN
  Administered 2024-06-15: 450 mL

## 2024-06-15 MED ORDER — ONDANSETRON HCL 4 MG/2ML IJ SOLN
INTRAMUSCULAR | Status: AC
Start: 2024-06-15 — End: 2024-06-15
  Filled 2024-06-15: qty 2

## 2024-06-15 MED ORDER — LIDOCAINE 2% (20 MG/ML) 5 ML SYRINGE
INTRAMUSCULAR | Status: DC | PRN
  Administered 2024-06-15: 100 mg via INTRAVENOUS

## 2024-06-15 MED ORDER — MIDAZOLAM HCL 5 MG/5ML IJ SOLN
INTRAMUSCULAR | Status: DC | PRN
  Administered 2024-06-15: 1 mg via INTRAVENOUS

## 2024-06-15 MED ORDER — LACTATED RINGERS IV SOLN
INTRAVENOUS | Status: DC
  Administered 2024-06-15: 1000 mL via INTRAVENOUS

## 2024-06-15 MED ORDER — CHLORHEXIDINE GLUCONATE 4 % EX SOLN: 60.0000 mL | Freq: Once | CUTANEOUS | Status: DC

## 2024-06-15 MED ORDER — VANCOMYCIN HCL 500 MG IV SOLR
INTRAVENOUS | Status: DC | PRN
  Administered 2024-06-15: 500 mg

## 2024-06-15 MED ORDER — MIDAZOLAM HCL 2 MG/2ML IJ SOLN
INTRAMUSCULAR | Status: AC
  Filled 2024-06-15: qty 2

## 2024-06-15 MED ORDER — ONDANSETRON HCL 4 MG/2ML IJ SOLN
INTRAMUSCULAR | Status: DC | PRN
  Administered 2024-06-15: 4 mg via INTRAVENOUS

## 2024-06-15 MED ORDER — BUPIVACAINE-EPINEPHRINE 0.5% -1:200000 IJ SOLN
INTRAMUSCULAR | Status: DC | PRN
  Administered 2024-06-15: 10 mL

## 2024-06-15 MED ORDER — LIDOCAINE 2% (20 MG/ML) 5 ML SYRINGE
INTRAMUSCULAR | Status: AC
  Filled 2024-06-15: qty 5

## 2024-06-15 MED ORDER — OXYCODONE HCL 5 MG/5ML PO SOLN: 5.0000 mg | Freq: Once | ORAL | Status: DC | PRN

## 2024-06-15 MED ORDER — OXYCODONE HCL 5 MG PO TABS: 5.0000 mg | ORAL_TABLET | Freq: Once | ORAL | Status: DC | PRN

## 2024-06-15 MED ORDER — EPHEDRINE SULFATE (PRESSORS) 50 MG/ML IJ SOLN
INTRAMUSCULAR | Status: DC | PRN
  Administered 2024-06-15 (×2): 5 mg via INTRAVENOUS

## 2024-06-15 MED ORDER — EPHEDRINE 5 MG/ML INJ
INTRAVENOUS | Status: AC
Start: 2024-06-15 — End: 2024-06-15
  Filled 2024-06-15: qty 5

## 2024-06-15 MED ORDER — CELECOXIB 200 MG PO CAPS
200.0000 mg | ORAL_CAPSULE | Freq: Once | ORAL | Status: AC
  Administered 2024-06-15: 200 mg via ORAL

## 2024-06-15 MED ORDER — FENTANYL CITRATE (PF) 100 MCG/2ML IJ SOLN
INTRAMUSCULAR | Status: AC
  Filled 2024-06-15: qty 2

## 2024-06-15 MED ORDER — ACETAMINOPHEN 500 MG PO TABS
1000.0000 mg | ORAL_TABLET | Freq: Once | ORAL | Status: AC
  Administered 2024-06-15: 1000 mg via ORAL

## 2024-06-15 MED ORDER — 0.9 % SODIUM CHLORIDE (POUR BTL) OPTIME
TOPICAL | Status: DC | PRN
  Administered 2024-06-15: 1000 mL

## 2024-06-15 MED ORDER — FENTANYL CITRATE (PF) 100 MCG/2ML IJ SOLN: 25.0000 ug | INTRAMUSCULAR | Status: DC | PRN

## 2024-06-15 MED ORDER — MEPERIDINE HCL 25 MG/ML IJ SOLN: 6.2500 mg | INTRAMUSCULAR | Status: DC | PRN

## 2024-06-15 MED ORDER — CEFAZOLIN SODIUM-DEXTROSE 2-4 GM/100ML-% IV SOLN
2.0000 g | INTRAVENOUS | Status: AC
  Administered 2024-06-15: 2 g via INTRAVENOUS

## 2024-06-15 SURGICAL SUPPLY — 49 items
BLADE AVERAGE 25X9 (BLADE) IMPLANT
BLADE SURG 15 STRL LF DISP TIS (BLADE) ×4 IMPLANT
BNDG COHESIVE 4X5 TAN STRL LF (GAUZE/BANDAGES/DRESSINGS) ×1 IMPLANT
BNDG COMPR ESMARK 6X3 LF (GAUZE/BANDAGES/DRESSINGS) ×1 IMPLANT
BNDG ELASTIC 4INX 5YD STR LF (GAUZE/BANDAGES/DRESSINGS) ×1 IMPLANT
BNDG GAUZE DERMACEA FLUFF 4 (GAUZE/BANDAGES/DRESSINGS) IMPLANT
BRUSH SCRUB EZ 4% CHG (MISCELLANEOUS) ×1 IMPLANT
CANISTER SUCT 1200ML W/VALVE (MISCELLANEOUS) ×1 IMPLANT
CHLORAPREP W/TINT 26 (MISCELLANEOUS) ×2 IMPLANT
COVER BACK TABLE 60X90IN (DRAPES) ×1 IMPLANT
CUFF TRNQT CYL 34X4.125X (TOURNIQUET CUFF) IMPLANT
DRAPE EXTREMITY T 121X128X90 (DISPOSABLE) ×1 IMPLANT
DRAPE IMP U-DRAPE 54X76 (DRAPES) ×1 IMPLANT
DRAPE U-SHAPE 47X51 STRL (DRAPES) ×1 IMPLANT
DRSG EMULSION OIL 3X3 NADH (GAUZE/BANDAGES/DRESSINGS) IMPLANT
ELECTRODE REM PT RTRN 9FT ADLT (ELECTROSURGICAL) ×1 IMPLANT
GAUZE PAD ABD 8X10 STRL (GAUZE/BANDAGES/DRESSINGS) ×1 IMPLANT
GAUZE SPONGE 4X4 12PLY STRL (GAUZE/BANDAGES/DRESSINGS) ×1 IMPLANT
GAUZE XEROFORM 1X8 LF (GAUZE/BANDAGES/DRESSINGS) ×1 IMPLANT
GLOVE BIOGEL PI IND STRL 8 (GLOVE) ×1 IMPLANT
GLOVE SURG SS PI 7.5 STRL IVOR (GLOVE) ×2 IMPLANT
GOWN STRL REUS W/ TWL LRG LVL3 (GOWN DISPOSABLE) ×2 IMPLANT
LAVAGE JET IRRISEPT WOUND (IRRIGATION / IRRIGATOR) IMPLANT
MARKER SKIN DUAL TIP RULER LAB (MISCELLANEOUS) IMPLANT
NDL HYPO 22X1.5 SAFETY MO (MISCELLANEOUS) IMPLANT
NEEDLE HYPO 22X1.5 SAFETY MO (MISCELLANEOUS) IMPLANT
NS IRRIG 1000ML POUR BTL (IV SOLUTION) ×1 IMPLANT
PACK BASIN DAY SURGERY FS (CUSTOM PROCEDURE TRAY) ×1 IMPLANT
PADDING CAST ABS COTTON 4X4 ST (CAST SUPPLIES) IMPLANT
PADDING CAST SYNTHETIC 4X4 STR (CAST SUPPLIES) ×4 IMPLANT
PENCIL SMOKE EVACUATOR (MISCELLANEOUS) ×1 IMPLANT
SANITIZER HAND ALTRA PUMP 550 (MISCELLANEOUS) ×1 IMPLANT
SET IRRIG Y TYPE TUR BLADDER L (SET/KITS/TRAYS/PACK) IMPLANT
SHEET MEDIUM DRAPE 40X70 STRL (DRAPES) ×1 IMPLANT
SLEEVE SCD COMPRESS KNEE MED (STOCKING) ×1 IMPLANT
SPIKE FLUID TRANSFER (MISCELLANEOUS) IMPLANT
SPONGE T-LAP 18X18 ~~LOC~~+RFID (SPONGE) ×1 IMPLANT
STOCKINETTE ORTHO 6X25 (MISCELLANEOUS) ×1 IMPLANT
SUCTION TUBE FRAZIER 10FR DISP (SUCTIONS) IMPLANT
SUT ETHILON 2 0 FS 18 (SUTURE) ×2 IMPLANT
SUT MNCRL AB 3-0 PS2 18 (SUTURE) ×1 IMPLANT
SUT VIC AB 2-0 SH 27XBRD (SUTURE) ×1 IMPLANT
SUT VIC AB 3-0 SH 27X BRD (SUTURE) IMPLANT
SUT VICRYL 0 SH 27 (SUTURE) IMPLANT
SYR BULB EAR ULCER 3OZ GRN STR (SYRINGE) ×1 IMPLANT
SYR CONTROL 10ML LL (SYRINGE) IMPLANT
TOWEL GREEN STERILE FF (TOWEL DISPOSABLE) ×2 IMPLANT
TUBE CONNECTING 20X1/4 (TUBING) ×1 IMPLANT
UNDERPAD 30X36 HEAVY ABSORB (UNDERPADS AND DIAPERS) ×1 IMPLANT

## 2024-06-15 NOTE — Op Note (Signed)
 06/15/2024  11:49 AM   PATIENT: [name]  73 y.o. female  MRN: [id number]   PRE-OPERATIVE DIAGNOSIS:   Right 2nd toe osteomyelitis   POST-OPERATIVE DIAGNOSIS:   Same   PROCEDURE: Right foot 2nd toe partial amputation through proximal phalanx   SURGEON:  Lillia Mountain, MD   ASSISTANT: None   ANESTHESIA: General, regional   EBL: Minimal   TOURNIQUET:   None used   COMPLICATIONS: None apparent   DISPOSITION: Extubated, awake and stable to recovery.   INDICATION FOR PROCEDURE: The patient presented with above diagnosis.  We discussed the diagnosis, alternative treatment options, risks and benefits of the above surgical intervention, as well as alternative non-operative treatments. All questions/concerns were addressed and the patient/family demonstrated appropriate understanding of the diagnosis, the procedure, the postoperative course, and overall prognosis. The patient wished to proceed with surgical intervention and signed an informed surgical consent as such, in each others presence prior to surgery.   PROCEDURE IN DETAIL: After preoperative consent was obtained and the correct operative site was identified, the patient was brought to the operating room supine on stretcher and transferred onto operating table. General anesthesia was induced. Preoperative antibiotics were administered. Surgical timeout was taken. The patient was then positioned supine with an ipsilateral hip bump. The operative lower extremity was prepped and draped in standard sterile fashion.  Level of amputation was determined by soft tissue status to be the 2nd toe proximal phalanx as confirmed by intraoperative examination. A standard fishmouth incision was made in healthy skin area at this level. Dissection was carried down sharply to the proximal phalanx. The surrounding tendons and other soft tissues were carefully transected. A sagittal saw blade was used to perform 2nd toe  amputation thru the proximal phalanx. The infected 2nd toe was thus excised and sent as specimen for bone biopsy/intraoperative cultures.  We then irrigated the surgical site thoroughly with normal saline. Healthy bleeding noted throughout the amputation stump site. There were no signs of infection remaining in the surgical site following amputation. Hemostasis was carefully obtained. Mild steady bleeding observed and noted to be appropriate for surgical site. Betadine  and Irrisept solutions were instilled. Vancomycin  powder was placed in the amputation site.    The skin was loosely closed with zero tension using 2-0 prolene suture. The incision was noted to be dry upon closure. Overall, the operative foot was noted to be significantly less erythematous with decreased swelling.   The leg was cleaned with saline and sterile adaptic dressings were applied. A well padded sterile dressing was applied. The patient was awakened from anesthesia and transported to the recovery room in stable condition.    FOLLOW UP PLAN: -transfer to PACU, then home -strict heel WB operative extremity, maximum elevation -maintain dry dressings until follow up -DVT ppx: Aspirin  81 mg twice daily -follow up as outpatient within 7-10 days for wound check -trend intra-op cx -sutures out in 2-3 weeks in outpatient office   RADIOGRAPHS: None used   Lillia Mountain Orthopa[facility]

## 2024-06-15 NOTE — Anesthesia Postprocedure Evaluation (Signed)
 Anesthesia Post Note  Patient: [name]  Procedure(s) Performed: AMPUTATION, TOE (Right: Toe)     Patient location during evaluation: PACU Anesthesia Type: General Level of consciousness: awake and alert Pain management: pain level controlled Vital Signs Assessment: post-procedure vital signs reviewed and stable Respiratory status: spontaneous breathing, nonlabored ventilation, respiratory function stable and patient connected to nasal cannula oxygen Cardiovascular status: blood pressure returned to baseline and stable Postop Assessment: no apparent nausea or vomiting Anesthetic complications: no   No notable events documented.  Last Vitals:  Vitals:   06/15/24 0930 06/15/24 0945  BP: 117/74 116/71  Pulse: 78 69  Resp: 20 17  Temp:    SpO2: 93% 95%    Last Pain:  Vitals:   06/15/24 0945  TempSrc:   PainSc: 0-No pain        RLE Motor Response: Purposeful movement (06/15/24 0945)        Gay Rape

## 2024-06-15 NOTE — H&P (Signed)
 H&P Update:  -History and Physical Reviewed  -Patient has been re-examined  -No change in the plan of care  -The risks and benefits were presented and reviewed. The risks due to suture failure and/or irritation, new/persistent infection, stiffness, nerve/vessel/tendon injury or rerupture of repaired tendon, nonunion/malunion, allograft usage, wound healing issues, development of arthritis, failure of this surgery, possibility of external fixation with delayed definitive surgery, need for further surgery, thromboembolic events, anesthesia/medical complications, amputation, death among others were discussed. The patient acknowledged the explanation, agreed to proceed with the plan and a consent was signed.  [name]

## 2024-06-15 NOTE — Anesthesia Procedure Notes (Signed)
 Procedure Name: LMA Insertion Date/Time: 06/15/2024 8:49 AM  Performed by: Frost Kayla MATSU, CRNAPre-anesthesia Checklist: Patient identified, Emergency Drugs available, Suction available and Patient being monitored Patient Re-evaluated:Patient Re-evaluated prior to induction Preoxygenation: Pre-oxygenation with 100% oxygen Induction Type: IV induction Ventilation: Mask ventilation without difficulty LMA: LMA inserted LMA Size: 4.0 Number of attempts: 1 Placement Confirmation: positive ETCO2 Tube secured with: Tape Dental Injury: Teeth and Oropharynx as per pre-operative assessment

## 2024-06-15 NOTE — Transfer of Care (Signed)
 Immediate Anesthesia Transfer of Care Note  Patient: [name]  Procedure(s) Performed: AMPUTATION, TOE (Right: Toe)  Patient Location: PACU  Anesthesia Type:General  Level of Consciousness: drowsy, patient cooperative, and responds to stimulation  Airway & Oxygen Therapy: Patient Spontanous Breathing and Patient connected to face mask oxygen  Post-op Assessment: Report given to RN and Post -op Vital signs reviewed and stable  Post vital signs: Reviewed and stable  Last Vitals:  Vitals Value Taken Time  BP 88/66 06/15/24 09:19  Temp    Pulse 82 06/15/24 09:21  Resp 17 06/15/24 09:21  SpO2 97 % 06/15/24 09:21  Vitals shown include unfiled device data.  Last Pain:  Vitals:   06/15/24 0656  TempSrc: Oral  PainSc: 4       Patients Stated Pain Goal: 7 (06/15/24 0656)  Complications: No notable events documented.

## 2024-06-16 ENCOUNTER — Encounter (HOSPITAL_BASED_OUTPATIENT_CLINIC_OR_DEPARTMENT_OTHER): Payer: Self-pay | Admitting: Orthopaedic Surgery

## 2024-06-20 LAB — AEROBIC/ANAEROBIC CULTURE W GRAM STAIN (SURGICAL/DEEP WOUND)
Gram Stain: NONE SEEN
Gram Stain: NONE SEEN

## 2024-07-06 ENCOUNTER — Ambulatory Visit: Admitting: Internal Medicine

## 2024-07-07 ENCOUNTER — Encounter: Payer: Self-pay | Admitting: Internal Medicine

## 2024-07-07 ENCOUNTER — Ambulatory Visit: Admitting: Internal Medicine

## 2024-07-07 ENCOUNTER — Other Ambulatory Visit: Payer: Self-pay

## 2024-07-07 VITALS — BP 110/59 | HR 85 | Temp 97.5°F | Wt 267.0 lb

## 2024-07-07 DIAGNOSIS — M869 Osteomyelitis, unspecified: Secondary | ICD-10-CM

## 2024-07-07 MED ORDER — FLUCONAZOLE 150 MG PO TABS: 150.0000 mg | ORAL_TABLET | Freq: Every day | ORAL | 2 refills | Status: AC

## 2024-07-07 MED ORDER — CEFADROXIL 500 MG PO CAPS: 1000.0000 mg | ORAL_CAPSULE | Freq: Two times a day (BID) | ORAL | 0 refills | Status: AC

## 2024-07-07 NOTE — Progress Notes (Signed)
 Patient Active Problem List   Diagnosis Date Noted   Radiculopathy of lumbar region 12/09/2023   RUQ pain 01/27/2022   Bloating 01/27/2022   Poor appetite 01/27/2022   Abdominal wall defect, acquired 01/27/2022   Rectal itching 08/24/2020   Colon cancer screening 08/24/2020   Diarrhea 02/22/2020   Abdominal pain, epigastric 07/29/2019   Elevated alkaline phosphatase level 07/29/2019   Acute cholecystitis 02/27/2016   DOE (dyspnea on exertion) 12/12/2013   Stiffness of joint, lower leg 09/10/2011   Bilateral leg weakness 09/10/2011   S/P TKR (total knee replacement) 08/10/2011   Hypertension 08/10/2011   Dyslipidemia 08/10/2011   Gastroesophageal reflux disease 08/10/2011   Lower urinary tract infectious disease 08/10/2011   OTHER DYSPHAGIA 05/21/2010   CEREBROVASCULAR ACCIDENT, HX OF 05/17/2010    Patient's Medications  New Prescriptions   No medications on file  Previous Medications   ASPIRIN  325 MG TABLET    Take 325 mg by mouth at bedtime.    CEPHALEXIN  (KEFLEX ) 250 MG CAPSULE    Take 250 mg by mouth 4 (four) times daily. Unsure of dosage   COENZYME Q10 (COQ10) 200 MG CAPS    Take 200 mg by mouth at bedtime.   DICLOFENAC SODIUM (VOLTAREN) 1 % GEL    Apply 1 Application topically daily as needed (pain).   ESTRADIOL (ESTRACE) 0.1 MG/GM VAGINAL CREAM    Place 1 Applicatorful vaginally 3 (three) times a week.   LISINOPRIL -HYDROCHLOROTHIAZIDE  (ZESTORETIC ) 20-25 MG TABLET    Take 1 tablet by mouth daily.   OXYBUTYNIN  (DITROPAN ) 5 MG TABLET    Take 5 mg by mouth at bedtime.   OXYCODONE -ACETAMINOPHEN  (PERCOCET/ROXICET) 5-325 MG TABLET    Take 1-2 tablets by mouth every 4 (four) hours as needed for severe pain (pain score 7-10).   PANTOPRAZOLE  (PROTONIX ) 40 MG TABLET    Take 1 tablet (40 mg total) by mouth 2 (two) times daily before a meal.   SIMVASTATIN  (ZOCOR ) 40 MG TABLET    Take 40 mg by mouth at bedtime.   SULFAMETHOXAZOLE -TRIMETHOPRIM  (BACTRIM  DS) 800-160 MG  TABLET    Take 1 tablet by mouth 2 (two) times daily.   TIZANIDINE  (ZANAFLEX ) 2 MG CAPSULE    Take 1 capsule (2 mg total) by mouth every 6 (six) hours as needed for muscle spasms.  Modified Medications   No medications on file  Discontinued Medications   No medications on file    Subjective: 73 year old female with past medical history hypertension hyperlipidemia, arthritis, GERD previously followed by podiatry for right foot infection, underwent Right foot second toe partial amputation through proximal phalanx for right second toe osteomyelitis on 9/10.  Last seen by podiatry on 7/17, x-ray of right foot was done at that time.  Cultures from the OR grew staph lugdunensis 2/2 sets.  Patient had been on Keflex  since 9/8 which was continued.  Keflex  500 mg 4 times daily.  Referred to infectious disease for further management. Today 07/07/24 : Discussed the use of AI scribe software for clinical note transcription with the patient, who gave verbal consent to proceed.  She has been experiencing issues with her toe since 2019, initially due to a callus that caused pain and soreness. On March 07, 2024, her toe became red and swollen without any trauma, prompting her daughter to take her to urgent care.  In June 2025, she was prescribed doxycycline  by urgent care, but there was a delay in receiving the medication due  to a communication error. Despite treatment, her symptoms persisted, and an x-ray showed no changes. She was eventually referred to an orthopedic specialist in August 2025.  In September 2025, an MRI revealed an infection in the bone, leading to the amputation of her toe on June 15, 2024. Post-surgery, she has been on Keflex  500 mg four times a day since June 14, 2024, but there was confusion regarding the prescribed dosage. She has a home health nurse and her husband assisting with her care.  She developed a yeast infection shortly after starting the higher dose of antibiotics. She has  experienced similar issues in the past when on antibiotics. Review of Systems: Review of Systems  All other systems reviewed and are negative.   Past Medical History:  Diagnosis Date   Arthritis    Gastritis    GERD (gastroesophageal reflux disease)    HTN (hypertension)    Hyperlipidemia    TIA (transient ischemic attack) 2004   no residual    Social History   Tobacco Use   Smoking status: Never   Smokeless tobacco: Never  Vaping Use   Vaping status: Never Used  Substance Use Topics   Alcohol use: No   Drug use: No    Family History  Problem Relation Age of Onset   Congestive Heart Failure Mother    Diabetes Mother    Hypertension Mother    Diabetes Father    Osteoarthritis Father    Colon cancer Neg Hx     No Known Allergies  Health Maintenance  Topic Date Due   Medicare Annual Wellness (AWV)  Never done   Hepatitis C Screening  Never done   Mammogram  Never done   Pneumococcal Vaccine: 50+ Years (2 of 2 - PCV20 or PCV21) 10/06/2009   DEXA SCAN  Never done   Zoster Vaccines- Shingrix (2 of 2) 12/16/2018   Influenza Vaccine  05/06/2024   COVID-19 Vaccine (4 - 2025-26 season) 06/06/2024   Colonoscopy  10/09/2030   DTaP/Tdap/Td (2 - Td or Tdap) 05/16/2032   HPV VACCINES  Aged Out   Meningococcal B Vaccine  Aged Out    Objective:  Vitals:   07/07/24 0842  Weight: 267 lb (121.1 kg)   Body mass index is 39.43 kg/m.  Physical Exam Constitutional:      Appearance: Normal appearance.  HENT:     Head: Normocephalic and atraumatic.     Right Ear: Tympanic membrane normal.     Left Ear: Tympanic membrane normal.     Nose: Nose normal.     Mouth/Throat:     Mouth: Mucous membranes are moist.  Eyes:     Extraocular Movements: Extraocular movements intact.     Conjunctiva/sclera: Conjunctivae normal.     Pupils: Pupils are equal, round, and reactive to light.  Cardiovascular:     Rate and Rhythm: Normal rate and regular rhythm.     Heart sounds: No  murmur heard.    No friction rub. No gallop.  Pulmonary:     Effort: Pulmonary effort is normal.     Breath sounds: Normal breath sounds.  Abdominal:     General: Abdomen is flat.     Palpations: Abdomen is soft.  Skin:    General: Skin is warm and dry.  Neurological:     General: No focal deficit present.     Mental Status: She is alert and oriented to person, place, and time.  Psychiatric:        Mood  and Affect: Mood normal.    Lab Results Lab Results  Component Value Date   WBC 8.2 12/01/2023   HGB 14.1 12/01/2023   HCT 42.8 12/01/2023   MCV 92.0 12/01/2023   PLT 281 12/01/2023    Lab Results  Component Value Date   CREATININE 0.97 06/14/2024   BUN 17 06/14/2024   NA 138 06/14/2024   K 3.8 06/14/2024   CL 102 06/14/2024   CO2 24 06/14/2024    Lab Results  Component Value Date   ALT 14 04/25/2022   AST 14 04/25/2022   GGT 16 09/17/2020   ALKPHOS 135 (H) 01/27/2022   BILITOT 0.6 04/25/2022    No results found for: CHOL, HDL, LDLCALC, LDLDIRECT, TRIG, CHOLHDL No results found for: LABRPR, RPRTITER No results found for: HIV1RNAQUANT, HIV1RNAVL, CD4TABS   Problem List Items Addressed This Visit   None  Results   Assessment/Plan #Right second toe osteomyelitis status post partial amputation through proximal phalanx of right second toe.  Wound started as callus around June. On and off abx. Seen by podiatry in interim. MRI done with [facility] and OR on 9/10 for osteo - OR cultures grew staph lugdunensis - Patient has been on Keflex  500 qid times daily since 9/8.  -orthopedics on 9/18 noted that doing well overall and progressing appropriately at.  Sutures/staples retained at that time. Plan  -labs -d/c keflex   -start cefadroxil x 6 weeks eot 10/21 -F/u wit ID in 6 weeks  #Vulvovaginal candidiasis Developed candidiasis likely due to antibiotic use. Symptoms followed increased antibiotic dosage. - Prescribe fluconazole for two  days, doses 72 hours apart. - Provide refills for fluconazole.   Loney Stank, MD [facility] 07/07/2024, 8:44 AM   I have personally spent 61 minutes involved in face-to-face and non-face-to-face activities for this patient on the day of the visit. Professional time spent includes the following activities: Preparing to see the patient (review of tests), Obtaining and/or reviewing separately obtained history (admission/discharge record), Performing a medically appropriate examination and/or evaluation , Ordering medications/tests/procedures, referring and communicating with other health care professionals, Documenting clinical information in the EMR, Independently interpreting results (not separately reported), Communicating results to the patient/family/caregiver, Counseling and educating the patient/family/caregiver and Care coordination (not separately reported).

## 2024-07-08 LAB — C-REACTIVE PROTEIN: CRP: <3 mg/L (ref <8.0)

## 2024-07-08 LAB — CBC WITH DIFFERENTIAL/PLATELET
Absolute Lymphocytes: 2265 {cells}/uL (ref 850–3900)
Absolute Monocytes: 669 {cells}/uL (ref 200–950)
Basophils Absolute: 61 {cells}/uL (ref 0–200)
Basophils Relative: 0.8 %
Eosinophils Absolute: 190 {cells}/uL (ref 15–500)
Eosinophils Relative: 2.5 %
HCT: 43.2 % (ref 35.0–45.0)
Hemoglobin: 14 g/dL (ref 11.7–15.5)
MCH: 29.9 pg (ref 27.0–33.0)
MCHC: 32.4 g/dL (ref 32.0–36.0)
MCV: 92.1 fL (ref 80.0–100.0)
MPV: 9.4 fL (ref 7.5–12.5)
Monocytes Relative: 8.8 %
Neutro Abs: 4416 {cells}/uL (ref 1500–7800)
Neutrophils Relative %: 58.1 %
Platelets: 286 Thousand/uL (ref 140–400)
RBC: 4.69 Million/uL (ref 3.80–5.10)
RDW: 12.5 % (ref 11.0–15.0)
Total Lymphocyte: 29.8 %
WBC: 7.6 Thousand/uL (ref 3.8–10.8)

## 2024-07-08 LAB — COMPLETE METABOLIC PANEL WITHOUT GFR
AG Ratio: 1.3 (calc) (ref 1.0–2.5)
ALT: 9 U/L (ref 6–29)
AST: 11 U/L (ref 10–35)
Albumin: 3.9 g/dL (ref 3.6–5.1)
Alkaline phosphatase (APISO): 107 U/L (ref 37–153)
BUN: 16 mg/dL (ref 7–25)
CO2: 29 mmol/L (ref 20–32)
Calcium: 9.8 mg/dL (ref 8.6–10.4)
Chloride: 103 mmol/L (ref 98–110)
Creat: 0.92 mg/dL (ref 0.60–1.00)
Globulin: 3.1 g/dL (ref 1.9–3.7)
Glucose, Bld: 102 mg/dL — ABNORMAL HIGH (ref 65–99)
Potassium: 4.2 mmol/L (ref 3.5–5.3)
Sodium: 140 mmol/L (ref 135–146)
Total Bilirubin: 0.6 mg/dL (ref 0.2–1.2)
Total Protein: 7 g/dL (ref 6.1–8.1)

## 2024-07-08 LAB — SEDIMENTATION RATE: Sed Rate: 14 mm/h (ref 0–30)

## 2024-08-08 ENCOUNTER — Ambulatory Visit: Admitting: Internal Medicine

## 2024-08-08 ENCOUNTER — Other Ambulatory Visit: Payer: Self-pay

## 2024-08-08 ENCOUNTER — Encounter: Payer: Self-pay | Admitting: Internal Medicine

## 2024-08-08 VITALS — BP 95/54 | HR 74 | Temp 98.0°F | Resp 16 | Wt 271.0 lb

## 2024-08-08 DIAGNOSIS — M869 Osteomyelitis, unspecified: Secondary | ICD-10-CM

## 2024-08-08 DIAGNOSIS — M86171 Other acute osteomyelitis, right ankle and foot: Secondary | ICD-10-CM | POA: Diagnosis not present

## 2024-08-08 NOTE — Patient Instructions (Addendum)
 ASICS New Balances  Stop cefadroxil F/u in  a month

## 2024-08-08 NOTE — Progress Notes (Signed)
 Patient: [name]  DOB: [birth year] MRN: [id number] PCP: Sheryle Carwin, MD    Chief Complaint  Patient presents with   Follow-up    Osteomyelitis -- Pt reports she is doing well, no pain.      Patient Active Problem List   Diagnosis Date Noted   Radiculopathy of lumbar region 12/09/2023   RUQ pain 01/27/2022   Bloating 01/27/2022   Poor appetite 01/27/2022   Abdominal wall defect, acquired 01/27/2022   Rectal itching 08/24/2020   Colon cancer screening 08/24/2020   Diarrhea 02/22/2020   Abdominal pain, epigastric 07/29/2019   Elevated alkaline phosphatase level 07/29/2019   Acute cholecystitis 02/27/2016   DOE (dyspnea on exertion) 12/12/2013   Stiffness of joint, lower leg 09/10/2011   Bilateral leg weakness 09/10/2011   S/P TKR (total knee replacement) 08/10/2011   Hypertension 08/10/2011   Dyslipidemia 08/10/2011   Gastroesophageal reflux disease 08/10/2011   Lower urinary tract infectious disease 08/10/2011   OTHER DYSPHAGIA 05/21/2010   CEREBROVASCULAR ACCIDENT, HX OF 05/17/2010     Subjective:  [name] is a 73 year old female with past medical history hypertension hyperlipidemia, arthritis, GERD previously followed by podiatry for right foot infection, underwent Right foot second toe partial amputation through proximal phalanx for right second toe osteomyelitis on 9/10.  Last seen by podiatry on 7/17, x-ray of right foot was done at that time.  Cultures from the OR grew staph lugdunensis 2/2 sets.  Patient had been on Keflex  since 9/8 which was continued.  Keflex  500 mg 4 times daily.  Referred to infectious disease for further management. 07/07/24 : Discussed the use of AI scribe software for clinical note transcription with the patient, who gave verbal consent to proceed. Today  08/08/24: taking 1 pil twice a day. States foot fel sbetter Review of Systems  All other systems reviewed and are negative.   Past Medical History:  Diagnosis Date    Arthritis    Gastritis    GERD (gastroesophageal reflux disease)    HTN (hypertension)    Hyperlipidemia    TIA (transient ischemic attack) 2004   no residual    Outpatient Medications Prior to Visit  Medication Sig Dispense Refill   aspirin  325 MG tablet Take 325 mg by mouth at bedtime.      Coenzyme Q10 (COQ10) 200 MG CAPS Take 200 mg by mouth at bedtime.     lisinopril -hydrochlorothiazide  (ZESTORETIC ) 20-25 MG tablet Take 1 tablet by mouth daily.     meloxicam (MOBIC) 7.5 MG tablet 7.5 mg.     oxybutynin  (DITROPAN ) 5 MG tablet Take 5 mg by mouth at bedtime.     pantoprazole  (PROTONIX ) 40 MG tablet Take 1 tablet (40 mg total) by mouth 2 (two) times daily before a meal. 60 tablet 11   simvastatin  (ZOCOR ) 40 MG tablet Take 40 mg by mouth at bedtime.     diclofenac Sodium (VOLTAREN) 1 % GEL Apply 1 Application topically daily as needed (pain). (Patient not taking: Reported on 08/08/2024)     estradiol (ESTRACE) 0.1 MG/GM vaginal cream Place 1 Applicatorful vaginally 3 (three) times a week. (Patient not taking: Reported on 08/08/2024)     fluconazole (DIFLUCAN) 150 MG tablet Take 1 tablet (150 mg total) by mouth daily. Take 72 hours apart (Patient not taking: Reported on 08/08/2024) 2 tablet 2   oxyCODONE -acetaminophen  (PERCOCET/ROXICET) 5-325 MG tablet Take 1-2 tablets by mouth every 4 (four) hours as needed for severe pain (pain score 7-10). (Patient not  taking: Reported on 08/08/2024) 30 tablet 0   sulfamethoxazole -trimethoprim  (BACTRIM  DS) 800-160 MG tablet Take 1 tablet by mouth 2 (two) times daily. (Patient not taking: Reported on 08/08/2024) 20 tablet 1   tizanidine  (ZANAFLEX ) 2 MG capsule Take 1 capsule (2 mg total) by mouth every 6 (six) hours as needed for muscle spasms. (Patient not taking: Reported on 08/08/2024) 24 capsule 2   No facility-administered medications prior to visit.     No Known Allergies  Social History   Tobacco Use   Smoking status: Never   Smokeless tobacco:  Never  Vaping Use   Vaping status: Never Used  Substance Use Topics   Alcohol use: No   Drug use: No    Family History  Problem Relation Age of Onset   Congestive Heart Failure Mother    Diabetes Mother    Hypertension Mother    Diabetes Father    Osteoarthritis Father    Colon cancer Neg Hx     Objective:   Vitals:   08/08/24 1059  Weight: 271 lb (122.9 kg)   Body mass index is 40.02 kg/m.  Physical Exam Constitutional:      Appearance: Normal appearance.  HENT:     Head: Normocephalic and atraumatic.     Right Ear: Tympanic membrane normal.     Left Ear: Tympanic membrane normal.     Nose: Nose normal.     Mouth/Throat:     Mouth: Mucous membranes are moist.  Eyes:     Extraocular Movements: Extraocular movements intact.     Conjunctiva/sclera: Conjunctivae normal.     Pupils: Pupils are equal, round, and reactive to light.  Cardiovascular:     Rate and Rhythm: Normal rate and regular rhythm.     Heart sounds: No murmur heard.    No friction rub. No gallop.  Pulmonary:     Effort: Pulmonary effort is normal.     Breath sounds: Normal breath sounds.  Abdominal:     General: Abdomen is flat.     Palpations: Abdomen is soft.  Skin:    General: Skin is warm and dry.  Neurological:     General: No focal deficit present.     Mental Status: She is alert and oriented to person, place, and time.  Psychiatric:        Mood and Affect: Mood normal.     Lab Results: Lab Results  Component Value Date   WBC 7.6 07/07/2024   HGB 14.0 07/07/2024   HCT 43.2 07/07/2024   MCV 92.1 07/07/2024   PLT 286 07/07/2024    Lab Results  Component Value Date   CREATININE 0.92 07/07/2024   BUN 16 07/07/2024   NA 140 07/07/2024   K 4.2 07/07/2024   CL 103 07/07/2024   CO2 29 07/07/2024    Lab Results  Component Value Date   ALT 9 07/07/2024   AST 11 07/07/2024   GGT 16 09/17/2020   ALKPHOS 135 (H) 01/27/2022   BILITOT 0.6 07/07/2024     Assessment & Plan:   #Right second toe osteomyelitis status post partial amputation through proximal phalanx of right second toe.  Wound started as callus around June. On and off abx. Seen by podiatry in interim. MRI done with [facility] orhto and OR on 9/10 for osteo - OR cultures grew staph lugdunensis - Patient has been on Keflex  500 qid times daily since 9/8.  -orthopedics on 9/18 noted that doing well overall and progressing appropriately at.  Sutures/staples  retained at that time. -plan was cefadroxil 2 tab bid to complete 6 weeks eot 10/21(keflex  was sotpped). 10/2 labs stable. She is takign only 500mg  bid of cefadroxil, has 10  pills left. Wound feels much better Plan -stop abx -F/u with ortho -F/u with ID in a month    Loney Stank, MD [facility]   08/08/24  11:03 AM I have personally spent 41 minutes involved in face-to-face and non-face-to-face activities for this patient on the day of the visit. Professional time spent includes the following activities: Preparing to see the patient (review of tests), Obtaining and/or reviewing separately obtained history (admission/discharge record), Performing a medically appropriate examination and/or evaluation , Ordering medications/tests/procedures, referring and communicating with other health care professionals, Documenting clinical information in the EMR, Independently interpreting results (not separately reported), Communicating results to the patient/family/caregiver, Counseling and educating the patient/family/caregiver and Care coordination (not separately reported).

## 2024-09-07 ENCOUNTER — Ambulatory Visit: Admitting: Internal Medicine

## 2024-09-07 ENCOUNTER — Encounter: Payer: Self-pay | Admitting: Internal Medicine

## 2024-09-07 ENCOUNTER — Other Ambulatory Visit: Payer: Self-pay

## 2024-09-07 DIAGNOSIS — M869 Osteomyelitis, unspecified: Secondary | ICD-10-CM

## 2024-09-07 NOTE — Progress Notes (Signed)
 Patient: [name]  DOB: Aug 08, 1951 MRN: [id number] PCP: Marissa Carwin, MD    Chief Complaint  Patient presents with   Follow-up     Patient Active Problem List   Diagnosis Date Noted   Radiculopathy of lumbar region 12/09/2023   RUQ pain 01/27/2022   Bloating 01/27/2022   Poor appetite 01/27/2022   Abdominal wall defect, acquired 01/27/2022   Rectal itching 08/24/2020   Colon cancer screening 08/24/2020   Diarrhea 02/22/2020   Abdominal pain, epigastric 07/29/2019   Elevated alkaline phosphatase level 07/29/2019   Acute cholecystitis 02/27/2016   DOE (dyspnea on exertion) 12/12/2013   Stiffness of joint, lower leg 09/10/2011   Bilateral leg weakness 09/10/2011   S/P TKR (total knee replacement) 08/10/2011   Hypertension 08/10/2011   Dyslipidemia 08/10/2011   Gastroesophageal reflux disease 08/10/2011   Lower urinary tract infectious disease 08/10/2011   OTHER DYSPHAGIA 05/21/2010   CEREBROVASCULAR ACCIDENT, HX OF 05/17/2010     Subjective:  [name] is a 73 year old female with past medical history hypertension hyperlipidemia, arthritis, GERD previously followed by podiatry for right foot infection, underwent Right foot second toe partial amputation through proximal phalanx for right second toe osteomyelitis on 9/10.  Last seen by podiatry on 7/17, x-ray of right foot was done at that time.  Cultures from the OR grew staph lugdunensis 2/2 sets.  Patient had been on Keflex  since 9/8 which was continued.  Keflex  500 mg 4 times daily.  Referred to infectious disease for further management. 07/07/24 : Discussed the use of AI scribe software for clinical note transcription with the patient, who gave verbal consent to proceed.  08/08/24: taking 1 pil twice a day. States foot fel sbetter 12/2:doingwell off of abx. Wounds yealed.  Review of Systems  All other systems reviewed and are negative.   Past Medical History:  Diagnosis Date   Arthritis    Gastritis     GERD (gastroesophageal reflux disease)    HTN (hypertension)    Hyperlipidemia    TIA (transient ischemic attack) 2004   no residual    Outpatient Medications Prior to Visit  Medication Sig Dispense Refill   aspirin  325 MG tablet Take 325 mg by mouth at bedtime.      Coenzyme Q10 (COQ10) 200 MG CAPS Take 200 mg by mouth at bedtime.     diclofenac Sodium (VOLTAREN) 1 % GEL Apply 1 Application topically daily as needed (pain). (Patient not taking: Reported on 08/08/2024)     estradiol (ESTRACE) 0.1 MG/GM vaginal cream Place 1 Applicatorful vaginally 3 (three) times a week. (Patient not taking: Reported on 08/08/2024)     fluconazole  (DIFLUCAN ) 150 MG tablet Take 1 tablet (150 mg total) by mouth daily. Take 72 hours apart (Patient not taking: Reported on 08/08/2024) 2 tablet 2   lisinopril -hydrochlorothiazide  (ZESTORETIC ) 20-25 MG tablet Take 1 tablet by mouth daily.     meloxicam (MOBIC) 7.5 MG tablet 7.5 mg.     oxybutynin  (DITROPAN ) 5 MG tablet Take 5 mg by mouth at bedtime.     oxyCODONE -acetaminophen  (PERCOCET/ROXICET) 5-325 MG tablet Take 1-2 tablets by mouth every 4 (four) hours as needed for severe pain (pain score 7-10). (Patient not taking: Reported on 08/08/2024) 30 tablet 0   pantoprazole  (PROTONIX ) 40 MG tablet Take 1 tablet (40 mg total) by mouth 2 (two) times daily before a meal. 60 tablet 11   simvastatin  (ZOCOR ) 40 MG tablet Take 40 mg by mouth at bedtime.  sulfamethoxazole -trimethoprim  (BACTRIM  DS) 800-160 MG tablet Take 1 tablet by mouth 2 (two) times daily. (Patient not taking: Reported on 08/08/2024) 20 tablet 1   tizanidine  (ZANAFLEX ) 2 MG capsule Take 1 capsule (2 mg total) by mouth every 6 (six) hours as needed for muscle spasms. (Patient not taking: Reported on 08/08/2024) 24 capsule 2   No facility-administered medications prior to visit.     No Known Allergies  Social History   Tobacco Use   Smoking status: Never   Smokeless tobacco: Never  Vaping Use   Vaping  status: Never Used  Substance Use Topics   Alcohol use: No   Drug use: No    Family History  Problem Relation Age of Onset   Congestive Heart Failure Mother    Diabetes Mother    Hypertension Mother    Diabetes Father    Osteoarthritis Father    Colon cancer Neg Hx     Objective:  There were no vitals filed for this visit. There is no height or weight on file to calculate BMI.  Physical Exam Constitutional:      Appearance: Normal appearance.  HENT:     Head: Normocephalic and atraumatic.     Right Ear: Tympanic membrane normal.     Left Ear: Tympanic membrane normal.     Nose: Nose normal.     Mouth/Throat:     Mouth: Mucous membranes are moist.  Eyes:     Extraocular Movements: Extraocular movements intact.     Conjunctiva/sclera: Conjunctivae normal.     Pupils: Pupils are equal, round, and reactive to light.  Cardiovascular:     Rate and Rhythm: Normal rate and regular rhythm.     Heart sounds: No murmur heard.    No friction rub. No gallop.  Pulmonary:     Effort: Pulmonary effort is normal.     Breath sounds: Normal breath sounds.  Abdominal:     General: Abdomen is flat.     Palpations: Abdomen is soft.  Musculoskeletal:        General: Normal range of motion.  Skin:    General: Skin is warm and dry.  Neurological:     General: No focal deficit present.     Mental Status: She is alert and oriented to person, place, and time.  Psychiatric:        Mood and Affect: Mood normal.     Lab Results: Lab Results  Component Value Date   WBC 7.6 07/07/2024   HGB 14.0 07/07/2024   HCT 43.2 07/07/2024   MCV 92.1 07/07/2024   PLT 286 07/07/2024    Lab Results  Component Value Date   CREATININE 0.92 07/07/2024   BUN 16 07/07/2024   NA 140 07/07/2024   K 4.2 07/07/2024   CL 103 07/07/2024   CO2 29 07/07/2024    Lab Results  Component Value Date   ALT 9 07/07/2024   AST 11 07/07/2024   GGT 16 09/17/2020   ALKPHOS 135 (H) 01/27/2022   BILITOT 0.6  07/07/2024     Assessment & Plan:  #Right second toe osteomyelitis status post partial amputation through proximal phalanx of right second toe.  Wound started as callus around June. On and off abx. Seen by podiatry in interim. MRI done with [facility] orhto and OR on 9/10 for osteo - OR cultures grew staph lugdunensis - Patient has been on Keflex  500 qid times daily since 9/8.  -orthopedics on 9/18 noted that doing well overall and progressing  appropriately at.  Sutures/staples retained at that time. -plan was cefadroxil  2 tab bid to complete 6 weeks eot 10/21(keflex  was sotpped). 10/2 labs stable. She is takign only 500mg  bid of cefadroxil , has 10  pills left. Wound feels much better -11/7: stopped abx. Wound healed Plan -F/u with ortho on 11/10 noted f/u in 2-3 month. She has switched to wide shoes due to bunyon, protusion which is imprving. Declined posidatry referral -F/u with ID prn   Loney Stank, MD [facility]   09/07/24  9:58 AM   I personally spent a total of 32 minutes in the care of the patient today including preparing to see the patient, getting/reviewing separately obtained history, performing a medically appropriate exam/evaluation, counseling and educating, independently interpreting results, and communicating results.
